# Patient Record
Sex: Female | Born: 1940 | Race: Black or African American | Hispanic: No | Marital: Married | Smoking: Former smoker
Health system: Southern US, Community
[De-identification: ages and names within clinical notes are randomized; demographics above are authoritative.]

## PROBLEM LIST (undated history)

## (undated) DIAGNOSIS — M1990 Unspecified osteoarthritis, unspecified site: Secondary | ICD-10-CM

## (undated) DIAGNOSIS — D649 Anemia, unspecified: Secondary | ICD-10-CM

## (undated) DIAGNOSIS — H409 Unspecified glaucoma: Secondary | ICD-10-CM

## (undated) DIAGNOSIS — Z780 Asymptomatic menopausal state: Secondary | ICD-10-CM

## (undated) DIAGNOSIS — K219 Gastro-esophageal reflux disease without esophagitis: Secondary | ICD-10-CM

## (undated) DIAGNOSIS — I10 Essential (primary) hypertension: Secondary | ICD-10-CM

## (undated) HISTORY — DX: Unspecified osteoarthritis, unspecified site: M19.90

## (undated) HISTORY — DX: Asymptomatic menopausal state: Z78.0

## (undated) HISTORY — DX: Essential (primary) hypertension: I10

## (undated) HISTORY — DX: Anemia, unspecified: D64.9

## (undated) HISTORY — PX: OTHER SURGICAL HISTORY: SHX169

## (undated) HISTORY — DX: Unspecified glaucoma: H40.9

---

## 1976-02-26 HISTORY — PX: TUBAL LIGATION: SHX77

## 1997-12-30 ENCOUNTER — Other Ambulatory Visit: Admission: RE | Admit: 1997-12-30 | Discharge: 1997-12-30 | Payer: Self-pay | Admitting: Obstetrics and Gynecology

## 1998-07-14 ENCOUNTER — Ambulatory Visit (HOSPITAL_COMMUNITY): Admission: RE | Admit: 1998-07-14 | Discharge: 1998-07-14 | Payer: Self-pay | Admitting: *Deleted

## 1999-02-12 ENCOUNTER — Other Ambulatory Visit: Admission: RE | Admit: 1999-02-12 | Discharge: 1999-02-12 | Payer: Self-pay | Admitting: Obstetrics and Gynecology

## 1999-03-23 ENCOUNTER — Encounter: Admission: RE | Admit: 1999-03-23 | Discharge: 1999-03-23 | Payer: Self-pay | Admitting: Obstetrics and Gynecology

## 1999-03-23 ENCOUNTER — Encounter: Payer: Self-pay | Admitting: Obstetrics and Gynecology

## 2000-05-13 ENCOUNTER — Encounter: Payer: Self-pay | Admitting: Obstetrics and Gynecology

## 2000-05-13 ENCOUNTER — Encounter: Admission: RE | Admit: 2000-05-13 | Discharge: 2000-05-13 | Payer: Self-pay | Admitting: Obstetrics and Gynecology

## 2003-08-23 ENCOUNTER — Encounter: Admission: RE | Admit: 2003-08-23 | Discharge: 2003-08-23 | Payer: Self-pay | Admitting: Family Medicine

## 2003-08-26 ENCOUNTER — Encounter (INDEPENDENT_AMBULATORY_CARE_PROVIDER_SITE_OTHER): Payer: Self-pay | Admitting: *Deleted

## 2003-08-26 LAB — CONVERTED CEMR LAB

## 2003-08-31 ENCOUNTER — Encounter: Admission: RE | Admit: 2003-08-31 | Discharge: 2003-08-31 | Payer: Self-pay | Admitting: Sports Medicine

## 2003-09-02 ENCOUNTER — Encounter: Admission: RE | Admit: 2003-09-02 | Discharge: 2003-09-02 | Payer: Self-pay | Admitting: Sports Medicine

## 2003-09-05 ENCOUNTER — Encounter: Admission: RE | Admit: 2003-09-05 | Discharge: 2003-09-05 | Payer: Self-pay | Admitting: Family Medicine

## 2003-09-16 ENCOUNTER — Encounter: Admission: RE | Admit: 2003-09-16 | Discharge: 2003-09-16 | Payer: Self-pay | Admitting: Family Medicine

## 2006-04-24 DIAGNOSIS — N951 Menopausal and female climacteric states: Secondary | ICD-10-CM | POA: Insufficient documentation

## 2006-04-25 ENCOUNTER — Encounter (INDEPENDENT_AMBULATORY_CARE_PROVIDER_SITE_OTHER): Payer: Self-pay | Admitting: *Deleted

## 2006-05-19 ENCOUNTER — Ambulatory Visit (HOSPITAL_COMMUNITY): Admission: RE | Admit: 2006-05-19 | Discharge: 2006-05-19 | Payer: Self-pay | Admitting: Family Medicine

## 2006-06-03 ENCOUNTER — Ambulatory Visit: Payer: Self-pay | Admitting: Family Medicine

## 2006-06-03 ENCOUNTER — Ambulatory Visit (HOSPITAL_COMMUNITY): Admission: RE | Admit: 2006-06-03 | Discharge: 2006-06-03 | Payer: Self-pay | Admitting: Family Medicine

## 2006-06-03 ENCOUNTER — Encounter: Payer: Self-pay | Admitting: Family Medicine

## 2006-06-03 DIAGNOSIS — F17200 Nicotine dependence, unspecified, uncomplicated: Secondary | ICD-10-CM

## 2006-06-03 DIAGNOSIS — Z87891 Personal history of nicotine dependence: Secondary | ICD-10-CM | POA: Insufficient documentation

## 2006-06-03 LAB — CONVERTED CEMR LAB
BUN: 11 mg/dL (ref 6–23)
CO2: 23 meq/L (ref 19–32)
Calcium: 9.2 mg/dL (ref 8.4–10.5)
Chloride: 107 meq/L (ref 96–112)
Glucose, Bld: 97 mg/dL (ref 70–99)
HCT: 38.1 %
HDL: 50 mg/dL (ref >39)
Hemoglobin: 12.3 g/dL
MCV: 76.5 fL
Sodium: 141 meq/L (ref 135–145)
TSH: 1.519 microintl units/mL (ref 0.350–5.50)
VLDL: 25 mg/dL (ref 0–40)
WBC: 7.7 10*3/uL

## 2006-06-04 ENCOUNTER — Encounter: Payer: Self-pay | Admitting: Family Medicine

## 2006-06-06 ENCOUNTER — Encounter: Payer: Self-pay | Admitting: Family Medicine

## 2006-09-30 ENCOUNTER — Ambulatory Visit: Payer: Self-pay | Admitting: Family Medicine

## 2006-10-02 ENCOUNTER — Ambulatory Visit: Payer: Self-pay | Admitting: Family Medicine

## 2006-11-14 ENCOUNTER — Encounter: Payer: Self-pay | Admitting: Family Medicine

## 2007-06-22 ENCOUNTER — Ambulatory Visit (HOSPITAL_COMMUNITY): Admission: RE | Admit: 2007-06-22 | Discharge: 2007-06-22 | Payer: Self-pay | Admitting: Family Medicine

## 2007-08-12 ENCOUNTER — Ambulatory Visit (HOSPITAL_BASED_OUTPATIENT_CLINIC_OR_DEPARTMENT_OTHER): Admission: RE | Admit: 2007-08-12 | Discharge: 2007-08-12 | Payer: Self-pay | Admitting: Orthopedic Surgery

## 2008-05-15 ENCOUNTER — Emergency Department (HOSPITAL_COMMUNITY): Admission: EM | Admit: 2008-05-15 | Discharge: 2008-05-15 | Payer: Self-pay | Admitting: Emergency Medicine

## 2008-06-23 ENCOUNTER — Ambulatory Visit (HOSPITAL_COMMUNITY): Admission: RE | Admit: 2008-06-23 | Discharge: 2008-06-23 | Payer: Self-pay | Admitting: Family Medicine

## 2008-12-14 ENCOUNTER — Encounter: Payer: Self-pay | Admitting: Family Medicine

## 2008-12-14 ENCOUNTER — Ambulatory Visit: Payer: Self-pay | Admitting: Family Medicine

## 2008-12-14 DIAGNOSIS — E663 Overweight: Secondary | ICD-10-CM

## 2008-12-14 DIAGNOSIS — E669 Obesity, unspecified: Secondary | ICD-10-CM | POA: Insufficient documentation

## 2008-12-14 LAB — CONVERTED CEMR LAB
HDL: 54 mg/dL (ref >39)
LDL Cholesterol: 89 mg/dL (ref 0–99)
Total CHOL/HDL Ratio: 2.9
VLDL: 16 mg/dL (ref 0–40)

## 2008-12-17 ENCOUNTER — Encounter: Payer: Self-pay | Admitting: Family Medicine

## 2008-12-19 ENCOUNTER — Encounter: Payer: Self-pay | Admitting: Family Medicine

## 2008-12-20 ENCOUNTER — Encounter: Admission: RE | Admit: 2008-12-20 | Discharge: 2008-12-20 | Payer: Self-pay | Admitting: Family Medicine

## 2008-12-20 ENCOUNTER — Encounter: Payer: Self-pay | Admitting: Family Medicine

## 2009-01-17 ENCOUNTER — Encounter: Payer: Self-pay | Admitting: Family Medicine

## 2009-01-26 ENCOUNTER — Encounter: Payer: Self-pay | Admitting: Family Medicine

## 2009-02-25 HISTORY — PX: JOINT REPLACEMENT: SHX530

## 2009-03-01 ENCOUNTER — Encounter: Payer: Self-pay | Admitting: Family Medicine

## 2009-03-09 ENCOUNTER — Encounter: Payer: Self-pay | Admitting: Family Medicine

## 2009-03-09 DIAGNOSIS — K648 Other hemorrhoids: Secondary | ICD-10-CM | POA: Insufficient documentation

## 2009-03-09 DIAGNOSIS — K5730 Diverticulosis of large intestine without perforation or abscess without bleeding: Secondary | ICD-10-CM

## 2009-03-09 HISTORY — DX: Diverticulosis of large intestine without perforation or abscess without bleeding: K57.30

## 2009-03-10 ENCOUNTER — Encounter: Payer: Self-pay | Admitting: Family Medicine

## 2009-03-13 ENCOUNTER — Encounter: Payer: Self-pay | Admitting: Family Medicine

## 2009-03-15 ENCOUNTER — Encounter: Payer: Self-pay | Admitting: Family Medicine

## 2009-03-20 ENCOUNTER — Ambulatory Visit (HOSPITAL_COMMUNITY): Admission: RE | Admit: 2009-03-20 | Discharge: 2009-03-20 | Payer: Self-pay | Admitting: Family Medicine

## 2009-03-20 ENCOUNTER — Ambulatory Visit: Payer: Self-pay | Admitting: Family Medicine

## 2009-03-23 ENCOUNTER — Encounter: Payer: Self-pay | Admitting: Family Medicine

## 2009-04-03 ENCOUNTER — Telehealth: Payer: Self-pay | Admitting: Family Medicine

## 2009-04-05 ENCOUNTER — Ambulatory Visit: Payer: Self-pay | Admitting: Family Medicine

## 2009-04-05 ENCOUNTER — Encounter: Payer: Self-pay | Admitting: Family Medicine

## 2009-04-05 LAB — CONVERTED CEMR LAB
BUN: 10 mg/dL (ref 6–23)
CO2: 26 meq/L (ref 19–32)
Calcium: 8.9 mg/dL (ref 8.4–10.5)
Chloride: 105 meq/L (ref 96–112)
Hemoglobin: 10.8 g/dL — ABNORMAL LOW (ref 12.0–15.0)
MCHC: 30.9 g/dL (ref 30.0–36.0)
Platelets: 295 10*3/uL (ref 150–400)
RBC: 4.59 M/uL (ref 3.87–5.11)
RDW: 17.1 % — ABNORMAL HIGH (ref 11.5–15.5)
Sodium: 141 meq/L (ref 135–145)
WBC: 9.2 10*3/uL (ref 4.0–10.5)

## 2009-04-07 ENCOUNTER — Encounter: Payer: Self-pay | Admitting: Family Medicine

## 2009-04-07 DIAGNOSIS — I10 Essential (primary) hypertension: Secondary | ICD-10-CM | POA: Insufficient documentation

## 2009-04-12 ENCOUNTER — Encounter: Payer: Self-pay | Admitting: Family Medicine

## 2009-04-13 ENCOUNTER — Encounter: Payer: Self-pay | Admitting: Family Medicine

## 2009-04-17 ENCOUNTER — Encounter: Payer: Self-pay | Admitting: Family Medicine

## 2009-04-17 ENCOUNTER — Ambulatory Visit: Payer: Self-pay | Admitting: Family Medicine

## 2009-04-18 ENCOUNTER — Encounter: Payer: Self-pay | Admitting: Family Medicine

## 2009-04-18 DIAGNOSIS — D509 Iron deficiency anemia, unspecified: Secondary | ICD-10-CM | POA: Insufficient documentation

## 2009-04-18 LAB — CONVERTED CEMR LAB
Iron: 22 ug/dL — ABNORMAL LOW (ref 42–145)
Saturation Ratios: 7 % — ABNORMAL LOW (ref 20–55)

## 2009-04-19 ENCOUNTER — Inpatient Hospital Stay (HOSPITAL_COMMUNITY): Admission: RE | Admit: 2009-04-19 | Discharge: 2009-04-22 | Payer: Self-pay | Admitting: Orthopedic Surgery

## 2009-06-23 ENCOUNTER — Encounter: Payer: Self-pay | Admitting: Family Medicine

## 2009-07-05 ENCOUNTER — Ambulatory Visit (HOSPITAL_COMMUNITY): Admission: RE | Admit: 2009-07-05 | Discharge: 2009-07-05 | Payer: Self-pay | Admitting: Family Medicine

## 2009-07-07 ENCOUNTER — Encounter: Payer: Self-pay | Admitting: Family Medicine

## 2009-07-10 ENCOUNTER — Encounter: Admission: RE | Admit: 2009-07-10 | Discharge: 2009-07-10 | Payer: Self-pay | Admitting: Family Medicine

## 2010-01-03 ENCOUNTER — Encounter: Payer: Self-pay | Admitting: Family Medicine

## 2010-02-15 ENCOUNTER — Ambulatory Visit (HOSPITAL_COMMUNITY)
Admission: RE | Admit: 2010-02-15 | Discharge: 2010-02-15 | Payer: Self-pay | Source: Home / Self Care | Admitting: Family Medicine

## 2010-02-15 ENCOUNTER — Ambulatory Visit: Payer: Self-pay | Admitting: Family Medicine

## 2010-02-15 ENCOUNTER — Encounter: Payer: Self-pay | Admitting: Family Medicine

## 2010-02-15 DIAGNOSIS — I498 Other specified cardiac arrhythmias: Secondary | ICD-10-CM | POA: Insufficient documentation

## 2010-02-15 DIAGNOSIS — R011 Cardiac murmur, unspecified: Secondary | ICD-10-CM | POA: Insufficient documentation

## 2010-02-15 LAB — CONVERTED CEMR LAB
BUN: 16 mg/dL (ref 6–23)
CO2: 26 meq/L (ref 19–32)
Calcium: 9.6 mg/dL (ref 8.4–10.5)
Glucose, Bld: 100 mg/dL — ABNORMAL HIGH (ref 70–99)
Sodium: 141 meq/L (ref 135–145)

## 2010-02-23 ENCOUNTER — Ambulatory Visit: Admission: RE | Admit: 2010-02-23 | Discharge: 2010-02-23 | Payer: Self-pay | Source: Home / Self Care

## 2010-02-23 ENCOUNTER — Encounter: Payer: Self-pay | Admitting: Family Medicine

## 2010-02-23 ENCOUNTER — Ambulatory Visit (HOSPITAL_COMMUNITY)
Admission: RE | Admit: 2010-02-23 | Discharge: 2010-02-23 | Payer: Self-pay | Source: Home / Self Care | Attending: Family Medicine | Admitting: Family Medicine

## 2010-03-02 ENCOUNTER — Telehealth: Payer: Self-pay | Admitting: Family Medicine

## 2010-03-09 ENCOUNTER — Telehealth: Payer: Self-pay | Admitting: Family Medicine

## 2010-03-18 ENCOUNTER — Encounter: Payer: Self-pay | Admitting: Family Medicine

## 2010-03-25 LAB — CONVERTED CEMR LAB
AST: 13 units/L
Alkaline Phosphatase: 71 units/L
BUN: 10 mg/dL
Bilirubin, Direct: 0.1 mg/dL
Chloride: 105 meq/L
Creatinine, Ser: 0.82 mg/dL
Glucose, Bld: 88 mg/dL
MCV: 78 fL
RBC: 4.95 M/uL
RDW: 17.4 %
Sodium: 140 meq/L
Total Bilirubin: 0.2 mg/dL

## 2010-03-29 NOTE — Assessment & Plan Note (Signed)
Summary: pre op,df   Vital Signs:  Patient profile:   70 year old female Weight:      190.6 pounds Temp:     97.8 degrees F oral Pulse rate:   86 / minute Pulse rhythm:   regular BP sitting:   160 / 87  (left arm) Cuff size:   regular  Vitals Entered By: Loralee Pacas CMA (April 05, 2009 1:51 PM)  Serial Vital Signs/Assessments:  Time      Position  BP       Pulse  Resp  Temp     By                     150/90                         Tenicia Gural MD   History of Present Illness: 70 y/o F is here for pre-op clearance.  She is scheduled to have L knee replacement with Dr Luiz Blare on 04/19/09.  Jodi Geralds, MD.  [facility].  Fax [phone].  Phone [phone].    Clotting problems:  Pt states that when she was pregnant with her 3rd child she had blood clot.  She cannot recall if she was on any blood thinners during or after pregnancy.  I told her to inform Dr Luiz Blare of this prior to surgery but that most likely she will get blood thinner prior to and after surgery.  BP: pt checks at [facility] and it is usally in the 150s.  Today BP is elevated also.  Pt does not have Dx of hypertension.    Pain in left knee.  Pt stopped taking Ibuprofen one week ago.  She is just dealing with the pain now as she waits for surgery.    Obesity:  worsened.  gained weight.  not exercising.  states that she and husband will join [employer] because they qualify for free membership.    Habits & Providers  Alcohol-Tobacco-Diet     Alcohol drinks/day: 0     Tobacco Status: quit > 6 months  Exercise-Depression-Behavior     Does Patient Exercise: no      Have you felt down or hopeless? no     STD Risk: never  Current Medications (verified): 1)  Ibuprofen 200 Mg Tabs (Ibuprofen) .... 2 Tab By Mouth Bid 2)  Ferrous Sulfate 325 (65 Fe) Mg Tabs (Ferrous Sulfate) .Marland Kitchen.. 1 Tab By Mouth Daily 3)  Metoprolol Tartrate 50 Mg Tabs (Metoprolol Tartrate) .... 1/2 Tab By Mouth Two Times A Day For Blood  Presure  Allergies (verified): No Known Drug Allergies  Past History:  Past Surgical History: Last updated: 03/09/2009 Left knee surgery 07/2007: fracture bone in knee Colonoscopy 02/2009 by Dr Loreta Ave  Family History: Last updated: 04/05/2009 Father - HTN, CVA age 41. died from aneurysm Mother - healthy, 55 yr old Brother- died from prostate cancer No colon cancer Neice with breast cancer (age 40) No gyn cancer  Social History: Last updated: 04/05/2009 Married, lives with husband and grandson (pt adopted him when he was 11 days old, age 37 now).   Works at [employer]   5 children (youngest adopted- he doesn't know) smokes 1-2 pack per day until 2009 no EtOH Drug use-no  Risk Factors: Alcohol Use: 0 (04/05/2009) Exercise: no  (04/05/2009)  Risk Factors: Smoking Status: quit > 6 months (04/05/2009)  Past Medical History: Obesity Internal Hemorrhoids (Dr Loreta Ave 02/2009) Sigmoid  Diverticulosis (Dr Loreta Ave 02/2009) MMSE 30/30 on 04/05/09  Family History: Reviewed history from 12/14/2008 and no changes required. Father - HTN, CVA age 31. died from aneurysm Mother - healthy, 2 yr old Brother- died from prostate cancer No colon cancer Neice with breast cancer (age 26) No gyn cancer  Social History: Reviewed history from 12/14/2008 and no changes required. Married, lives with husband and grandson (pt adopted him when he was 63 days old, age 24 now).   Works at [employer]   5 children (youngest adopted- he doesn't know) smokes 1-2 pack per day until 2009 no EtOH Drug use-no Does Patient Exercise:  no   Review of Systems  The patient denies anorexia, fever, weight loss, chest pain, syncope, peripheral edema, prolonged cough, headaches, abdominal pain, hematuria, incontinence, and abnormal bleeding.    Physical Exam  General:  Well-developed,well-nourished,in no acute distress; alert,appropriate and cooperative throughout examination. vitals  reviewed. Head:  normocephalic, atraumatic, and no abnormalities observed.   Neck:  No deformities, masses, or tenderness noted. Lungs:  Normal respiratory effort, chest expands symmetrically. Lungs are clear to auscultation, no crackles or wheezes. Heart:  Holosystolic murmur louder towards axilla.  Otherwise normal rate.  no JVD.   Abdomen:  Bowel sounds positive,abdomen soft and non-tender without masses, organomegaly or hernias noted. Msk:  No deformity  noted of thoracic or lumbar spine.   Pulses:  R radial normal, R dorsalis pedis normal, L radial normal, and L dorsalis pedis normal.   Extremities:  No clubbing, cyanosis, edema, or deformity noted with normal full range of motion of all joints.   Neurologic:  No cranial nerve deficits noted. Station and gait are normal. Plantar reflexes are down-going bilaterally. DTRs are symmetrical throughout. Sensory, motor and coordinative functions appear intact. Skin:  Intact without suspicious lesions or rashes Cervical Nodes:  No lymphadenopathy noted Psych:  Oriented X3, memory intact for recent and remote, and normally interactive.   Additional Exam:  .    Impression & Recommendations:  Problem # 1:  PREOPERATIVE EXAMINATION (ICD-V72.84) Assessment New Pt without history of MI or CAD.  She is currently working part time in daycare.  MMSE today was 30/30.  She states today history of clot during pregnancy with 3rd child, but does not have further details.  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  Given elevated BP and age, will start low dose BB or mycardial protection  Pt is at intermediate risk for orthopedic surgery.  Will get CBC and Bmet today.   Orders: Basic Met-[facility] ([id number]) CBC-[facility] (14782) [facility]- Est  Level 4 (95621)  Problem # 2:  HYPERTENSION NEC (ICD-997.91) Assessment: New BP elevated today, even with recheck.  Pt has had BPs in 150s when she checked at [facility].  Will start low-dose  BB as it is cardioprotected.  Will start Lopressor 25mg  by mouth two times a day.  Pt to rtc in 4-6 wks for recheck.   Orders: CBC-[facility] (30865) [facility]- Est  Level 4 (78469)  Problem # 3:  OBESITY, CLASS I (ICD-278.02) Assessment: Deteriorated Pt has gained 4 lbs since last visit.  W e discussed at length the need to have healthy diet (decreased fried foods, animal products) and exercise.  Pt will have knee surgery, which will leave her less mobile so she will need to be more conscious of food decisions.  Pt will need to resume exercise regime as soon as she is cleared by PT  and orthop. Orders: Basic Met-[facility] ([id number]) CBC-[facility] (09811) [facility]- Est  Level 4 (91478)  Complete Medication List: 1)  Ibuprofen 200 Mg Tabs (Ibuprofen) .... 2 tab by mouth bid 2)  Ferrous Sulfate 325 (65 Fe) Mg Tabs (Ferrous sulfate) .Marland Kitchen.. 1 tab by mouth daily 3)  Metoprolol Tartrate 50 Mg Tabs (Metoprolol tartrate) .... 1/2 tab by mouth two times a day for blood presure  Patient Instructions: 1)  Please schedule a follow-up appointment in 4-6 weeks for blood pressue.  2)  New medicine: Metoprolol 50mg ; 1/2 tab by mouth two times a day for blood pressure.  Prescriptions: METOPROLOL TARTRATE 50 MG TABS (METOPROLOL TARTRATE) 1/2 tab by mouth two times a day for blood presure  #34 x 5   Entered and Authorized by:   Angeline Slim MD   Signed by:   Angeline Slim MD on 04/05/2009   Method used:   Electronically to        [id number]       [address]       [place], [place]  [address]       Ph: [phone] or [phone]       Fax: [phone]   RxID:   [id number]   [id number]: MINI-MENTAL STATUS EXAM    MAX              SCORE   SCORE      ORIENTATION      5     :      What is the (yr)(season)(date)(day)(month) ?        5     :      Where are we (state)(county)(town/city)                       (hospital/office)(floor)                      REGISTRATION      3     :              Name 3 common objects (e.g. "apple","table",                       "penny") Take 1 second to say each. then ask                                     the pt to repeat all 3 after you have said them.        Give 1 point for each correct answer.                       Then repeat them until pt learns all 3. Count the trials & record.         Trials:                      ATTENTION & CALCULATION      5     :             Spell "world" backwards. Score = number of letters in correct order.            (D__L__R__O__W__)                      RECALL  3     :           Ask for the 3 objects repeated above(1 pt each)                       [Note: Recall cannot be tested if all 3 objects                       were note remembered during registration]                      LANGUAGE      2     :          Name a "pencil", and "watch". (2 pts)      1     :          Repeat the following."No ifs, ands, or buts."      3     :           Follow a 3-stage command:                       "Take a piece of paper in your right hand, fold it in half, and put it on the floor."                        Read & obey the following:      1     :           CLOSE YOUR EYES.             (1 POINT)      1     :          WRITE A SENTENCE.            (1 POINT)      1     :          COPY THE FOLLOWING DESIGN.   (1 POINT)  TOTAL SCORE:     OUT OF MAXIMUM OF 30  Total score: 30/30  Appended Document: pre op,df

## 2010-03-29 NOTE — Letter (Signed)
Summary: Generic Letter  [facility]  [address]   [place], [place] [address]   Phone: [phone]  Fax: [phone]    04/18/2009  [name] [name] [address] [place], [place]  [address]  Dear Ms. Raul Del,  I hope that this letter finds you well.  The recent blood test showed that you have iron deficiency anemia.  We can treat this by giving you daily iron supplements.  I have sent a prescription for Iron 325mg  to your family.  You should take this twice a day.  Iron supplementation may cause constipation.  You can decrease constipation by taking a stool softener daily while you are taking iron.  I will also send a prescription for a stool softener to your pharmacy.    If you have any questions, please contact me at the [facility].   Sincerely,   Anton Cheramie MD  Appended Document: Generic Letter mailed.

## 2010-03-29 NOTE — Miscellaneous (Signed)
  Clinical Lists Changes  Problems: Removed problem of HYPERTENSION NEC (ICD-997.91) Added new problem of ESSENTIAL HYPERTENSION (ICD-401.9) Observations: Added new observation of PRIMARY MD: Anita Laguna MD (04/07/2009 13:39)

## 2010-03-29 NOTE — Consult Note (Signed)
Summary: [facility]  [facility]   Imported By: Clydell Hakim 03/16/2009 16:37:42  _____________________________________________________________________  External Attachment:    Type:   Image     Comment:   External Document

## 2010-03-29 NOTE — Progress Notes (Signed)
Summary: Echo result  Phone Note Outgoing Call   Call placed by: Annaliz Aven MD,  March 02, 2010 12:32 PM Call placed to: Patient Summary of Call: Call pt to discuss Echo result.  Left message that she can call me back.   Echo showed: Normal LV size and systolic function, EF 55-60%. Moderat diastolic dysfunction. Thickened mitral leaflets with partial flail of the P2 segment and eccentric mitral regurgitation that may be severe.  Cardiologist suggest TEE to further evaluate.   Initial call taken by: Elouise Divelbiss MD,  March 02, 2010 12:32 PM

## 2010-03-29 NOTE — Progress Notes (Signed)
Summary: EKG result  Phone Note Outgoing Call   Call placed by: Dorothymae Maciver MD,  April 03, 2009 12:29 PM Call placed to: Patient Summary of Call: Called pt regarding EKG.  Told her that EKG same as previous and that I needed to see her for surgery clearance.  Pt voiced understanding. Initial call taken by: Lewi Drost MD,  April 03, 2009 12:31 PM

## 2010-03-29 NOTE — Assessment & Plan Note (Signed)
Summary: EKG for surgery clearance,tcb  Nurse Visit patient in office for EKG for surgery clearance. EKG done and results given to Dr. Swaziland. Patient denies any chest pain. Dr. Swaziland advises that patient schedule appointment for follow up with Dr. Janalyn Harder to discuss surgical  clearance. appointment scheduled for 04/03/2009. surgery is scheduled for 04/19/2009. Theresia Lo RN  March 20, 2009 11:07 AM   Allergies: No Known Drug Allergies  Orders Added: 1)  EKG- [facility] [EKG] 2)  Est Level 1- [facility] [[address]]

## 2010-03-29 NOTE — Miscellaneous (Signed)
Summary: Changing prob list   Clinical Lists Changes  Problems: Removed problem of PREOPERATIVE EXAMINATION (ICD-V72.84) Removed problem of SCREENING FOR IRON DEFICIENCY ANEMIA (ICD-V78.0) Removed problem of SCREENING FOR PULMONARY TB (ICD-V74.1) Removed problem of SCREENING FOR MALIGNANT NEOPLASM OF THE CERVIX (ICD-V76.2) Removed problem of HOT FLASHES (ICD-627.2)

## 2010-03-29 NOTE — Progress Notes (Signed)
Summary: phn msg: Echo result  Phone Note Call from Patient Call back at Home Phone [phone]   Caller: Patient Summary of Call: pt returned call  Initial call taken by: De Nurse,  March 09, 2010 3:44 PM  Follow-up for Phone Call        Called pt back.  Discussed Echo result with pt.  Discussed that echo showed flailing of Mitral valve that should be followed up with TEE.  Pt states that she will call her insurance first to see if they would cover the TEE because she would not have money to pay for it otherwise.  Asked pt to call me back with her decision, she agreed.  Follow-up by: Angeline Slim MD,  March 12, 2010 8:27 AM

## 2010-03-29 NOTE — Consult Note (Signed)
Summary: [facility]  [facility]   Imported By: Clydell Hakim 04/19/2009 15:38:07  _____________________________________________________________________  External Attachment:    Type:   Image     Comment:   External Document

## 2010-03-29 NOTE — Miscellaneous (Signed)
  Clinical Lists Changes  Problems: Removed problem of SPECIAL SCREENING FOR MALIGNANT NEOPLASMS COLON (ICD-V76.51) Removed problem of PHYSICAL EXAMINATION (ICD-V70.0) Added new problem of DIVERTICULOSIS, SIGMOID COLON (ICD-562.10) Added new problem of HEMORRHOIDS, INTERNAL (ICD-455.0) Observations: Added new observation of PAST SURG HX: Left knee surgery 07/2007: fracture bone in knee Colonoscopy 02/2009 by Dr Loreta Ave (03/09/2009 6:36) Added new observation of PAST MED HX: Obesity Internal Hemorrhoids (Dr Loreta Ave 02/2009) Sigmoid Diverticulosis (Dr Loreta Ave 02/2009)  (03/09/2009 6:36) Added new observation of PRIMARY MD: Jasraj Lappe MD (03/09/2009 6:36)       Past History:  Past Medical History: Obesity Internal Hemorrhoids (Dr Loreta Ave 02/2009) Sigmoid Diverticulosis (Dr Loreta Ave 02/2009)  Past Surgical History: Left knee surgery 07/2007: fracture bone in knee Colonoscopy 02/2009 by Dr Loreta Ave

## 2010-03-29 NOTE — Miscellaneous (Signed)
Summary: clearance note for surgery  Clinical Lists Changes Dr. Luiz Blare at [facility] orth[facility] needs a note clearing her for knee replacement surgery 04/19/09. form in md chart box.Golden Circle RN  March 15, 2009 9:21 AM     Please have pt come in for EKG. Jiovanna Frei MD  March 15, 2009 9:10 PM

## 2010-03-29 NOTE — Letter (Signed)
Summary: Right Breast Mass  [facility]  [address]   [place], [place] [address]   Phone: [phone]  Fax: [phone]    07/07/2009  [name] [name] [address] [place], [place]  [address]  Dear Ms. Raul Del,  I've tried to call you by phone but was unable to reach you.  Your most recently Mammogram showed an area in the Right Breast that is concerning.  This will need further evaluation.  If you have not done so, please call the [facility] Center to schedule an appointment.  Please let me know if you have any questions.       Sincerely,   Luba Matzen MD  Appended Document: Right Breast Mass mailed certified.

## 2010-03-29 NOTE — Letter (Signed)
Summary: Results Follow-up Letter  [facility]  [address]   [place], [place] [address]   Phone: [phone]  Fax:     06/06/2006  [address] [place], [place]  [address]  Dear Ms. ZANETTI,   The following are the results of your recent test(s):  Test     Result     Pap Smear    Normal___X___  Not Normal_____       Comments: _________________________________________________________ Cholesterol LDL(Bad cholesterol):          Your goal is less than:         HDL (Good cholesterol):        Your goal is more than: _________________________________________________________ Other Tests: Blood counts - normal  _________________________________________________________  Please call [phone] for an appointment Or _________________________________________________________ _________________________________________________________ _________________________________________________________  Sincerely,  Norton Blizzard MD [facility]

## 2010-03-29 NOTE — Miscellaneous (Signed)
Summary: Labs for Anemia work up  Clinical Lists Changes  Medications: Removed medication of FERROUS SULFATE 325 (65 FE) MG TABS (FERROUS SULFATE) 1 tab by mouth daily Orders: Added new Test order of B12-[facility] [phone]) - Signed Added new Test order of Ferritin-[facility] [phone]) - Signed Added new Test order of Iron -[facility] [phone]) - Signed Added new Test order of Iron Binding Cap (TIBC)-[facility] ([id number]) - Signed Added new Test order of Retic-[facility] ([id number]) - Signed Added new Test order of Miscellaneous Lab Charge-[facility] ([address]272) - Signed    Called pt at home number.  Told her that I would like to work up her Anemia.  Hb 10.8 recently and 11.3 and 12.3 in the past.  C-scope this year was wnl.  Pt has EGD scheduled.  Pt states that she will come by tomorrow.  Cherae Marton MD  April 12, 2009 5:10 PM

## 2010-03-29 NOTE — Letter (Signed)
Summary: PreOp Clearance  [facility]  [address]   [place], [place] [address]   Phone: [phone]  Fax: [phone]    04/07/2009  COURTANY MCMURPHY [address] [place], [place]  [address]  Dear Dr. Luiz Blare:  This letter is on behalf of Mrs. Gretel Acre for medical clearance for Left Knee Surgery.  Mrs. Blassingame is at low to moderate risk for this procedure.  She does not have a history of CAD or MI in the past 6 months.  MMSE with score of 30/30.  She did not have any changes on EKG, which showed NSR and an old septal infarct seen previous on EKG in 2008.  I am unsure of her clotting history as she mentioned during this office visit that she had a "blood clot" during pregnancy with her 3rd child.  She could not offer further details.  I have started Mrs. Thornley on a low dose beta-blocker, Lopressor 25mg  by mouth two times a day for hypertension.  I have enclosed a copy of Mrs. Alton's EKG and most recent CBC and Bmet for your records.  Please let me know if I can be of further assistance.      Sincerely,   Lyanna Blystone MD

## 2010-03-29 NOTE — Consult Note (Signed)
Summary: [facility]: negative, 10 yrs  [facility]   Imported By: De Nurse 03/07/2009 14:24:17  _____________________________________________________________________  External Attachment:    Type:   Image     Comment:   External Document  Appended Document: [facility]: c-scope    Clinical Lists Changes  Orders: Added new Test order of Mammogram (Screening) (Mammo) - Signed Observations: Added new observation of MAMMRECACT: Ordered (03/09/2009 6:31) Added new observation of NURSEINSTR: Schedule screening mammogram (see order)  (03/09/2009 6:31) Added new observation of HEMOCCRECACT: Not indicated (03/09/2009 6:31) Added new observation of COLONNXTDUE: 03/02/2019 (03/09/2009 6:31) Added new observation of DM PROGRESS: N/A (03/09/2009 6:31) Added new observation of DM FSREVIEW: N/A (03/09/2009 6:31) Added new observation of HTN PROGRESS: N/A (03/09/2009 6:31) Added new observation of HTN FSREVIEW: N/A (03/09/2009 6:31) Added new observation of LIPID PROGRS: N/A (03/09/2009 6:31) Added new observation of LIPID FSREVW: N/A (03/09/2009 6:31) Added new observation of COLONOSCOPY: Findings: Very redunctat colon, scattered sigmoid diverticulosis and prominent internal hemorrhoids, otherwise normal c-scope. Repeat C-scope in 10 yrs. EGD in 4-6 wks. (03/01/2009 6:34) Added new observation of COLONOSCOPY: Location:  [facility], Dr Loreta Ave   (03/01/2009 6:32)       Colonoscopy  Procedure date:  03/01/2009  Findings:      Location:  [facility], Dr Loreta Ave    Colonoscopy  Procedure date:  03/01/2009  Findings:      Findings: Very redunctat colon, scattered sigmoid diverticulosis and prominent internal hemorrhoids, otherwise normal c-scope. Repeat C-scope in 10 yrs. EGD in 4-6 wks.     Prevention & Chronic Care Immunizations   Influenza vaccine: Fluvax MCR  (12/14/2008)    Tetanus booster: 06/03/2006:  Td    Pneumococcal vaccine: Not documented    H. zoster vaccine: Not documented  Colorectal Screening   Hemoccult: Not documented   Hemoccult action/deferral: Not indicated  (03/09/2009)    Colonoscopy: Findings: Very redunctat colon, scattered sigmoid diverticulosis and prominent internal hemorrhoids, otherwise normal c-scope. Repeat C-scope in 10 yrs. EGD in 4-6 wks.  (03/01/2009)   Colonoscopy action/deferral: GI Referral  (12/14/2008)   Colonoscopy due: 03/02/2019  Other Screening   Pap smear: NEGATIVE FOR INTRAEPITHELIAL LESIONS OR MALIGNANCY.  (12/14/2008)    Mammogram: wnl  (05/15/2006)   Mammogram action/deferral: Ordered  (03/09/2009)    DXA bone density scan:  Lumbar Spine:  T Score > -1.0 Spine.  Hip Total: T Score > -1.0 Hip.    (12/20/2008)   DXA bone density action/deferral: Ordered  (12/14/2008)   Smoking status: quit > 6 months  (12/14/2008)  Lipids   Total Cholesterol: 159  (12/14/2008)   LDL: 89  (12/14/2008)   LDL Direct: Not documented   HDL: 54  (12/14/2008)   Triglycerides: 81  (12/14/2008)   Nursing Instructions: Schedule screening mammogram (see order)

## 2010-03-29 NOTE — Miscellaneous (Signed)
Summary: Knee surgery L  Clinical Lists Changes  Observations: Added new observation of PAST SURG HX: Left total knee arthroplasty, computer-assisted, Jodi Geralds MD, April 19, 2009. Lateral retinacular release, left knee, Jodi Geralds MD. 03/2009 Left knee surgery 07/2007: fracture bone in knee Colonoscopy 02/2009 by Dr Loreta Ave (06/23/2009 11:09) Added new observation of PRIMARY MD: Emani Morad MD (06/23/2009 11:09)       Past History:  Past Surgical History: Left total knee arthroplasty, computer-assisted, Jodi Geralds MD, April 19, 2009. Lateral retinacular release, left knee, Jodi Geralds MD. 03/2009 Left knee surgery 07/2007: fracture bone in knee Colonoscopy 02/2009 by Dr Loreta Ave

## 2010-03-29 NOTE — Miscellaneous (Signed)
Summary: MMG: negative 06/23/08  Clinical Lists Changes  Observations: Added new observation of MAMMO DUE: 06/23/2009 (03/10/2009 14:15) Added new observation of PAPRECACT: Deferred-2 yr interval (03/10/2009 14:15) Added new observation of PAP DUE: 03/10/2010 (03/10/2009 14:15) Added new observation of DM PROGRESS: N/A (03/10/2009 14:15) Added new observation of DM FSREVIEW: N/A (03/10/2009 14:15) Added new observation of HTN PROGRESS: N/A (03/10/2009 14:15) Added new observation of HTN FSREVIEW: N/A (03/10/2009 14:15) Added new observation of LIPID PROGRS: N/A (03/10/2009 14:15) Added new observation of LIPID FSREVW: N/A (03/10/2009 14:15) Added new observation of MAMMOGRAM: Assessment: BIRADS 1.  (06/23/2008 14:18)      Mammogram  Procedure date:  06/23/2008  Findings:      Assessment: BIRADS 1.      Prevention & Chronic Care Immunizations   Influenza vaccine: Fluvax MCR  (12/14/2008)    Tetanus booster: 06/03/2006: Td    Pneumococcal vaccine: Not documented    H. zoster vaccine: Not documented  Colorectal Screening   Hemoccult: Not documented   Hemoccult action/deferral: Not indicated  (03/09/2009)    Colonoscopy: Findings: Very redunctat colon, scattered sigmoid diverticulosis and prominent internal hemorrhoids, otherwise normal c-scope. Repeat C-scope in 10 yrs. EGD in 4-6 wks.  (03/01/2009)   Colonoscopy action/deferral: GI Referral  (12/14/2008)   Colonoscopy due: 03/02/2019  Other Screening   Pap smear: NEGATIVE FOR INTRAEPITHELIAL LESIONS OR MALIGNANCY.  (12/14/2008)   Pap smear action/deferral: Deferred-2 yr interval  (03/10/2009)   Pap smear due: 03/10/2010    Mammogram: Assessment: BIRADS 1.   (06/23/2008)   Mammogram action/deferral: Ordered  (03/09/2009)   Mammogram due: 06/23/2009    DXA bone density scan:  Lumbar Spine:  T Score > -1.0 Spine.  Hip Total: T Score > -1.0 Hip.    (12/20/2008)   DXA bone density action/deferral: Ordered   (12/14/2008)   Smoking status: quit > 6 months  (12/14/2008)  Lipids   Total Cholesterol: 159  (12/14/2008)   LDL: 89  (12/14/2008)   LDL Direct: Not documented   HDL: 54  (12/14/2008)   Triglycerides: 81  (12/14/2008)

## 2010-03-29 NOTE — Assessment & Plan Note (Signed)
Summary: med refills/eo   Vital Signs:  Patient profile:   70 year old female Height:      62.75 inches Weight:      195 pounds BMI:     34.94 Temp:     97.8 degrees F oral Pulse rate:   92 / minute Pulse (ortho):   59 / minute BP sitting:   147 / 80  (left arm) BP standing:   170 / 84  Vitals Entered By: Terese Door (February 15, 2010 1:38 PM)  Serial Vital Signs/Assessments:  Time      Position  BP       Pulse  Resp  Temp     By 2:09 PM   Lying LA  160/83   60                    Cat Ta MD 2:09 PM   Sitting   163/78   57                    Cat Ta MD 2:09 PM   Standing  170/84   59                    Cat Ta MD  CC: Medication refills Is Patient Diabetic? No Pain Assessment Patient in pain? no        Primary Care Shanvi Moyd:  Cat Ta MD  CC:  Medication refills.  History of Present Illness: 70 y/o F with HTN, obesity is her for medication refills.  This is pt's 3rd office visit with me since 2010.  I last saw here in 03/2009 for pre-op clearance.  At that time pt was hypertensive and I place her on low dose BB for cardioprotection for surgery.  She was to rtc to see me after 4-6 wks for f/u of HTN, but did not.  This is her first visit to [facility] since 03/2009.    HYPERTENSION Disease Monitoring Blood pressure range: 140s-160s Medications: metoprolol 25mg  two times a day Compliance: most of the time she remembers to take her meds  Lightheadedness: no Edema: no  Chest pain: no  Dyspnea:sometimes with exertion  Palpitations: when she forgets to take her meds  Prevention Exercise: difficult to walk fast since knee surgery, but still walking everyday about 1 hr Salt restriction: trying   OBESITY: [facility] and given Phentermine 15mg  .  She was told that when her BP is less than 140 she can start taking this medicine to assist for weight loss.  She wanted to know what I thougth about this.   BMI:  34.9 vs 33.3 from last visit Weight difference since last  OV: +5 lbs Exercise: states that knee surgery prevents her from exercise, but she does walk slowly for about 1 hr daily           Diet: Not really on diet   RASH: Location: R temporal area Onset: 2 months Progression: thinks it's getting bigger Description: rash is puritic, dry, scaly.  It is limited to R temporal area.   No new products (shampoo, soap, detergent, makeup, lotion, etc).     Iron deficiency: Pt has been told she has iron deficiency and placed on FeSO4 for repletion.  She has not taken this medicaiton due to constipation.  She was also given Rx for Colace to take along with iron to decrease constipation.  She did not take colace because she was fearful that  it would stimulate her appetite.    Habits & Providers  Alcohol-Tobacco-Diet     Tobacco Status: quit > 6 months  Current Medications (verified): 1)  Metoprolol Tartrate 50 Mg Tabs (Metoprolol Tartrate) .... 1/2 Tab By Mouth Two Times A Day For Blood Presure 2)  Ferrous Sulfate 325 (65 Fe) Mg Tabs (Ferrous Sulfate) .Marland Kitchen.. 1 Tab By Mouth Two Times A Day For Iron Deficiency 3)  Colace 100 Mg Caps (Docusate Sodium) .Marland Kitchen.. 1 Tab By Mouth Two Times A Day For Constipation (While Taking Iron)  Allergies (verified): No Known Drug Allergies  Past History:  Past Medical History: Last updated: 04/18/2009 Obesity Internal Hemorrhoids (Dr Loreta Ave 02/2009) Sigmoid Diverticulosis (Dr Loreta Ave 02/2009) Iron Def Anemia MMSE 30/30 on 04/05/09  Past Surgical History: Last updated: 06/23/2009 Left total knee arthroplasty, computer-assisted, Jodi Geralds MD, April 19, 2009. Lateral retinacular release, left knee, Jodi Geralds MD. 03/2009 Left knee surgery 07/2007: fracture bone in knee Colonoscopy 02/2009 by Dr Loreta Ave  Family History: Last updated: 04/05/2009 Father - HTN, CVA age 2. died from aneurysm Mother - healthy, 51 yr old Brother- died from prostate cancer No colon cancer Neice with breast cancer (age 58) No gyn cancer  Social  History: Last updated: 02/15/2010 Married, lives with husband and grandson (pt adopted him when he was 86 days old, age 85 now).   Works at [employer]   5 children (youngest adopted- he doesn't know) smokes 1-2 pack per day until 2009 no EtOH Drug use-no  Risk Factors: Alcohol Use: 0 (04/05/2009) Exercise: no  (04/05/2009)  Risk Factors: Smoking Status: quit > 6 months (02/15/2010)  Social History: Married, lives with husband and grandson (pt adopted him when he was 19 days old, age 56 now).   Works at [employer]   5 children (youngest adopted- he doesn't know) smokes 1-2 pack per day until 2009 no EtOH Drug use-no  Review of Systems General:  Complains of fatigue; denies chills, fever, loss of appetite, weakness, and weight loss. CV:  Complains of weight gain; denies chest pain or discomfort, difficulty breathing at night, difficulty breathing while lying down, fainting, lightheadness, near fainting, palpitations, shortness of breath with exertion, and swelling of feet. Resp:  Denies cough, shortness of breath, and sputum productive. GI:  Denies abdominal pain, constipation, diarrhea, nausea, and vomiting. GU:  Denies abnormal vaginal bleeding and dysuria. Derm:  Complains of itching and rash; denies changes in color of skin, changes in nail beds, dryness, excessive perspiration, flushing, and hair loss.  Physical Exam  General:  Well-developed,well-nourished,in no acute distress; alert,appropriate and cooperative throughout examination. Vitals reviewed.  Neck:  No deformities, masses, or tenderness noted. Lungs:  Normal respiratory effort, chest expands symmetrically. Lungs are clear to auscultation, no crackles or wheezes. Heart:  Holosystolic murmur louder towards axilla.  Bradycardia, rate 60.  no JVD.  no bruit.  Abdomen:  Bowel sounds positive,abdomen soft and non-tender without masses, organomegaly or hernias noted. obese.  Pulses:  +2  bilaterally  Extremities:  No clubbing, cyanosis, edema, or deformity noted with normal full range of motion of all joints.   Neurologic:  alert & oriented X3 and cranial nerves II-XII intact.   Skin:  Right temporal area: there is a hyperpigmented (dark brown/black) linear area, size 4mm x 2.5 cm, mildly raised, scaly to touch.  Looks almost like an old burn mark from curling or flat iron.  No redness, edema, ulcerations, drainage.  Cervical Nodes:  No lymphadenopathy noted  Impression & Recommendations:  Problem # 1:  ESSENTIAL HYPERTENSION (ICD-401.9) Assessment Deteriorated During exam pt was found to be bradycardic and EKG showed sinus bradycardia.  Orthostatics were negative, but confirmed elevated BP.  Pt's BP is not at goal of 130/80.  She has been noncompliant with her medication.  She is interested in lowering BP so that she can take Phentermine to suppress her appetite.  I've advised her against taking this medication as it may worsen her hypertension.  Since she was bradycardic today, I will d/c metoprolol.  Given that SBP  ~160 she will need a minimum of two agents to lower her BP.  Will start with Lisinopril10-HCTZ 12.5 daily.  This med can easily be doubled to get to maxed dose if needed.  I would like to get Echo to evaluate cardiac function given uncontrolled HTN.  Pt to rtc to see me in 4 wks.  Will need CBC and Bmet.   The following medications were removed from the medication list:    Metoprolol Tartrate 50 Mg Tabs (Metoprolol tartrate) .Marland Kitchen... 1 tab by mouth two times a day for blood presure Her updated medication list for this problem includes:    Lisinopril-hydrochlorothiazide 10-12.5 Mg Tabs (Lisinopril-hydrochlorothiazide) .Marland Kitchen... 1 tab by mouth daily for blood pressure  Orders: Basic [facility] ([address]) [facility]- Est  Level 4 ([address]11)  Problem # 2:  BRADYCARDIA (ICD-427.89) Assessment: New Likely from metoprolol.  EKG showed sinus brady with rate 53.  Will stop BB.  I  would like to get Echo to assess for murmur (which pt has chronically but there is not documentation) and given that she has uncontrolled HTN will likely have LVH.    The following medications were removed from the medication list:    Metoprolol Tartrate 50 Mg Tabs (Metoprolol tartrate) .Marland Kitchen... 1 tab by mouth two times a day for blood presure Her updated medication list for this problem includes:    Aspir-low 81 Mg Tbec (Aspirin) .Marland Kitchen... 1 tab by mouth daily  Orders: [facility]- Est  Level 4 ([address]1478)  Problem # 3:  FACIAL RASH (ICD-782.1) Assessment: New Itchy, scaly rash sizen<1inch in lenght and < 4mm in height.  Will start with hydrocortisone cream.    Her updated medication list for this problem includes:    Hydrocortisone 0.5 % Crea (Hydrocortisone) .Marland Kitchen... Apply two times a day to affected area on face. dispense qs  Orders: [facility]- Est  Level 4 ([address]562)  Problem # 4:  ANEMIA, IRON DEFICIENCY (ICD-280.9) Assessment: Unchanged Pt has not been taking iron supplements due to constipation.  She did not take colace prescribed for constipation because she was concerned that it would stimulate her appetite.  Discussed with pt that colace will not cause appetite stimulation.  Pt states she will start taking it now.  ***Note: colonoscopy 02/2009 by Dr Loreta Ave was normal.    Her updated medication list for this problem includes:    Ferrous Sulfate 325 (65 Fe) Mg Tabs (Ferrous sulfate) .Marland Kitchen... 1 tab by mouth two times a day for iron deficiency  Orders: [facility]- Est  Level 4 (13086)  Complete Medication List: 1)  Ferrous Sulfate 325 (65 Fe) Mg Tabs (Ferrous sulfate) .Marland Kitchen.. 1 tab by mouth two times a day for iron deficiency 2)  Colace 100 Mg Caps (Docusate sodium) .Marland Kitchen.. 1 tab by mouth two times a day for constipation (while taking iron) 3)  Aspir-low 81 Mg Tbec (Aspirin) .Marland Kitchen.. 1 tab by mouth daily 4)  Hydrocortisone 0.5 % Crea (Hydrocortisone) .... Apply  two times a day to affected area on face. dispense qs 5)   Lisinopril-hydrochlorothiazide 10-12.5 Mg Tabs (Lisinopril-hydrochlorothiazide) .Marland Kitchen.. 1 tab by mouth daily for blood pressure  Other Orders: 2 D Echo (2 D Echo)  Patient Instructions: 1)  Please schedule a follow-up appointment in 1 month for blood pressure.  2)  STOP Metoprolol 3)  NEW MEDICINE: Hydrochlorothiazide 12.5mg -Lisinopril 10mg  take 1 tab daily 4)  Rash: use hydrocortisone cream two times a day  5)  Take your iron pill two times a day  6)  Take an Aspirin every day.  Prescriptions: LISINOPRIL-HYDROCHLOROTHIAZIDE 10-12.5 MG TABS (LISINOPRIL-HYDROCHLOROTHIAZIDE) 1 tab by mouth daily for blood pressure  #30 x 3   Entered and Authorized by:   Angeline Slim MD   Signed by:   Angeline Slim MD on 02/15/2010   Method used:   Electronically to        [facility]  [address] [id number] ([facility]ail)       [address]       [place], [place]  [address]       Ph: [phone] or [phone]       Fax: [phone]   RxID:   [id number] HYDROCORTISONE 0.5 % CREA (HYDROCORTISONE) apply two times a day to affected area on face. Dispense QS  #1 x 3   Entered and Authorized by:   Angeline Slim MD   Signed by:   Angeline Slim MD on 02/15/2010   Method used:   [facility]ronically to        [facility]  [address] [id number] ([facility])       [address]       [place], [place]  [address]       Ph: [phone] or [phone]       Fax: [phone]   RxID:   [id number] COLACE 100 MG CAPS (DOCUSATE SODIUM) 1 tab by mouth two times a day for constipation (while taking iron)  #66 x 11   Entered and Authorized by:   Angeline Slim MD   Signed by:   Angeline Slim MD on 02/15/2010   Method used:   [facility]ically to        [facility]  [address] [id number] ([facility]       [address]       [place], [place]  [address]       Ph: [phone] or [phone]       Fax: [phone]   RxID:   [id number] FERROUS SULFATE 325 (65 FE) MG TABS (FERROUS SULFATE) 1 tab by mouth two times a day for iron  deficiency  #66 x 11   Entered and Authorized by:   Angeline Slim MD   Signed by:   Angeline Slim MD on 02/15/2010   Method used:   [facility]ronically to        [facility]  [address] [id number]       [address]       [place], [place]  [address]       Ph: [phone] or [phone]       Fax: [phone]   RxID:   [id number] METOPROLOL TARTRATE 50 MG TABS (METOPROLOL TARTRATE) 1 tab by mouth two times a day for blood presure  #60 x 3   Entered and Authorized by:   Angeline Slim MD   Signed by:  Cat Ta MD on 02/15/2010   Method used:   [facility]ically to        [facility]  [address] [id number]       [address]       [place], [place]  [address]       Ph: [phone] or [phone]       Fax: [phone]   RxID:   [id number]    Orders Added: 1)  Basic Met[facility] [phone] 2)  [facility]- Est  Level 4 [[address]956] 3)  2 D Echo [2 D Echo]     Prevention & Chronic Care Immunizations   Influenza vaccine: Fluvax MCR  (12/14/2008)   Influenza vaccine deferral: Not indicated  (02/15/2010)    Tetanus booster: 06/03/2006: Td    Pneumococcal vaccine: Not documented    H. zoster vaccine: Not documented  Colorectal Screening   Hemoccult: Not documented   Hemoccult action/deferral: Not indicated  (03/09/2009)    Colonoscopy: Findings: Very redunctat colon, scattered sigmoid diverticulosis and prominent internal hemorrhoids, otherwise normal c-scope. Repeat C-scope in 10 yrs. EGD in 4-6 wks.  (03/01/2009)   Colonoscopy action/deferral: GI Referral  (12/14/2008)   Colonoscopy due: 03/02/2019  Other Screening   Pap smear: NEGATIVE FOR INTRAEPITHELIAL LESIONS OR MALIGNANCY.  (12/14/2008)   Pap smear action/deferral: Deferred-2 yr interval  (03/10/2009)   Pap smear due: 03/10/2010    Mammogram: BI-RADS CATEGORY 1:  Negative.^MM DIGITAL DIAG LTD R  (07/10/2009)   Mammogram action/deferral: Not indicated  (02/15/2010)   Mammogram due: 06/23/2009    DXA  bone density scan:  Lumbar Spine:  T Score > -1.0 Spine.  Hip Total: T Score > -1.0 Hip.    (12/20/2008)   DXA bone density action/deferral: Ordered  (12/14/2008)   Smoking status: quit > 6 months  (02/15/2010)  Lipids   Total Cholesterol: 159  (12/14/2008)   Lipid panel action/deferral: Not indicated   LDL: 89  (12/14/2008)   LDL Direct: Not documented   HDL: 54  (12/14/2008)   Triglycerides: 81  (12/14/2008)  Hypertension   Last Blood Pressure: 147 / 80  (02/15/2010)   Serum creatinine: 0.78  (04/05/2009)   Serum potassium 4.1  (04/05/2009)    Hypertension flowsheet reviewed?: Yes   Progress toward BP goal: At goal  Self-Management Support :   Personal Goals (by the next clinic visit) :      Personal blood pressure goal: 140/90  (02/15/2010)   Hypertension self-management support: Not documented

## 2010-04-19 HISTORY — PX: KNEE ARTHROPLASTY: SHX992

## 2010-05-16 LAB — CBC
HCT: 30.3 % — ABNORMAL LOW (ref 36.0–46.0)
HCT: 34.2 % — ABNORMAL LOW (ref 36.0–46.0)
Hemoglobin: 11.2 g/dL — ABNORMAL LOW (ref 12.0–15.0)
Hemoglobin: 9.7 g/dL — ABNORMAL LOW (ref 12.0–15.0)
Hemoglobin: 9.8 g/dL — ABNORMAL LOW (ref 12.0–15.0)
MCHC: 32.5 g/dL (ref 30.0–36.0)
MCHC: 32.7 g/dL (ref 30.0–36.0)
MCHC: 33.3 g/dL (ref 30.0–36.0)
MCV: 76.9 fL — ABNORMAL LOW (ref 78.0–100.0)
MCV: 77.3 fL — ABNORMAL LOW (ref 78.0–100.0)
Platelets: 184 10*3/uL (ref 150–400)
Platelets: 215 10*3/uL (ref 150–400)
Platelets: 218 10*3/uL (ref 150–400)
RBC: 3.82 MIL/uL — ABNORMAL LOW (ref 3.87–5.11)
RBC: 3.92 MIL/uL (ref 3.87–5.11)
RBC: 4.45 MIL/uL (ref 3.87–5.11)
RDW: 16.7 % — ABNORMAL HIGH (ref 11.5–15.5)
RDW: 16.9 % — ABNORMAL HIGH (ref 11.5–15.5)
RDW: 17.1 % — ABNORMAL HIGH (ref 11.5–15.5)
RDW: 17.5 % — ABNORMAL HIGH (ref 11.5–15.5)
WBC: 10.5 10*3/uL (ref 4.0–10.5)
WBC: 13.8 10*3/uL — ABNORMAL HIGH (ref 4.0–10.5)

## 2010-05-16 LAB — TYPE AND SCREEN
ABO/RH(D): A POS
Antibody Screen: POSITIVE
DAT, IgG: NEGATIVE
Donor AG Type: NEGATIVE
PT AG Type: NEGATIVE

## 2010-05-16 LAB — DIFFERENTIAL
Basophils Absolute: 0 10*3/uL (ref 0.0–0.1)
Basophils Relative: 0 % (ref 0–1)
Eosinophils Absolute: 0.3 10*3/uL (ref 0.0–0.7)
Eosinophils Relative: 3 % (ref 0–5)
Lymphocytes Relative: 21 % (ref 12–46)
Lymphs Abs: 2.2 10*3/uL (ref 0.7–4.0)
Monocytes Absolute: 0.7 10*3/uL (ref 0.1–1.0)
Monocytes Relative: 6 % (ref 3–12)
Neutro Abs: 7.3 10*3/uL (ref 1.7–7.7)
Neutrophils Relative %: 70 % (ref 43–77)

## 2010-05-16 LAB — COMPREHENSIVE METABOLIC PANEL
ALT: 32 U/L (ref 0–35)
AST: 25 U/L (ref 0–37)
Albumin: 3.6 g/dL (ref 3.5–5.2)
Alkaline Phosphatase: 80 U/L (ref 39–117)
BUN: 7 mg/dL (ref 6–23)
CO2: 28 mEq/L (ref 19–32)
Calcium: 9.2 mg/dL (ref 8.4–10.5)
Chloride: 106 mEq/L (ref 96–112)
Creatinine, Ser: 0.85 mg/dL (ref 0.4–1.2)
GFR calc Af Amer: >60 mL/min (ref >60)
GFR calc non Af Amer: >60 mL/min (ref >60)
Glucose, Bld: 94 mg/dL (ref 70–99)
Potassium: 4 mEq/L (ref 3.5–5.1)
Sodium: 140 mEq/L (ref 135–145)
Total Bilirubin: 0.6 mg/dL (ref 0.3–1.2)
Total Protein: 6.9 g/dL (ref 6.0–8.3)

## 2010-05-16 LAB — PROTIME-INR
INR: 1.05 (ref 0.00–1.49)
INR: 1.44 (ref 0.00–1.49)
INR: 2.38 — ABNORMAL HIGH (ref 0.00–1.49)
Prothrombin Time: 13.8 seconds (ref 11.6–15.2)
Prothrombin Time: 17.4 seconds — ABNORMAL HIGH (ref 11.6–15.2)

## 2010-05-16 LAB — BASIC METABOLIC PANEL
BUN: 11 mg/dL (ref 6–23)
CO2: 29 mEq/L (ref 19–32)
Calcium: 8.1 mg/dL — ABNORMAL LOW (ref 8.4–10.5)
Chloride: 103 mEq/L (ref 96–112)
Creatinine, Ser: 0.78 mg/dL (ref 0.4–1.2)
GFR calc Af Amer: >60 mL/min (ref >60)
GFR calc non Af Amer: >60 mL/min (ref >60)
Glucose, Bld: 145 mg/dL — ABNORMAL HIGH (ref 70–99)
Potassium: 4.2 mEq/L (ref 3.5–5.1)
Sodium: 135 mEq/L (ref 135–145)

## 2010-05-16 LAB — URINALYSIS, ROUTINE W REFLEX MICROSCOPIC
Nitrite: NEGATIVE
Specific Gravity, Urine: 1.015 (ref 1.005–1.030)
Urobilinogen, UA: 1 mg/dL (ref 0.0–1.0)
pH: 7 (ref 5.0–8.0)

## 2010-05-16 LAB — APTT: aPTT: 35 seconds (ref 24–37)

## 2010-05-16 LAB — URINE MICROSCOPIC-ADD ON

## 2010-05-29 ENCOUNTER — Encounter: Payer: Self-pay | Admitting: Home Health Services

## 2010-06-07 LAB — POCT I-STAT, CHEM 8
HCT: 37 % (ref 36.0–46.0)
Hemoglobin: 12.6 g/dL (ref 12.0–15.0)
Sodium: 138 mEq/L (ref 135–145)
TCO2: 23 mmol/L (ref 0–100)

## 2010-06-14 ENCOUNTER — Other Ambulatory Visit: Payer: Self-pay | Admitting: Family Medicine

## 2010-06-14 NOTE — Telephone Encounter (Signed)
Refill request

## 2010-06-22 ENCOUNTER — Other Ambulatory Visit: Payer: Self-pay | Admitting: Family Medicine

## 2010-06-22 DIAGNOSIS — Z1231 Encounter for screening mammogram for malignant neoplasm of breast: Secondary | ICD-10-CM

## 2010-07-06 ENCOUNTER — Ambulatory Visit: Payer: Medicare Other | Admitting: Home Health Services

## 2010-07-10 NOTE — Op Note (Signed)
[name] [name], [name] [name]                ACCOUNT NO.:  [id number]   MEDICAL RECORD NO.:  [id number]          PATIENT TYPE:  AMB   LOCATION:  [facility]                          FACILITY:  [facility]   PHYSICIAN:  Harvie Junior, M.D.   DATE OF BIRTH:  12-01-1940   DATE OF PROCEDURE:  08/12/2007  DATE OF DISCHARGE:                               OPERATIVE REPORT   PREOPERATIVE DIAGNOSIS:  Medial meniscal tear.   POSTOPERATIVE DIAGNOSES:  1. Lateral meniscal tear with large osteochondral defect in the      lateral femoral condyle with large osteocartilaginous loose body.  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  3. Chondromalacia of patellofemoral compartment.   PROCEDURES:  1. Partial lateral meniscectomy with corresponding debridement of the      large lateral femoral condylar defect.  2. Chondroplasty medial femoral condyle.  3. Chondroplasty to patellofemoral joint.  4. Excision of large osteocartilaginous loose body.   SURGEON:  Harvie Junior, MD   ASSISTANT:  None.   ANESTHESIA:  General.   BRIEF HISTORY:  [name] [name] is a 70 year old female with a long history  of having had significant left knee pain with recurrent effusions.  We  have seen her in the office and felt that she could have meniscal  pathology.  We ultimately did an injection into her knee.  She got  minimal relief with the injection and following this she ultimately  called [place] as she wanted to undergo operative knee arthroscopy.  We  discussed the possibility of my examination.  We felt we did not need it  is necessary to go on the arthroscopy and she is brought to the  operating room for this procedure.   PROCEDURE:  The patient was brought to the operating room.  After  adequate anesthesia was obtained using general anesthetic, the patient  was placed supine on the operating table.  The left leg was prepped and  draped in usual sterile fashion.  Following this, routine arthroscopic  examination of the knee  revealed that there was obvious chondromalacia  in the patellofemoral joint grade 2 in nature.  There was chondromalacia  in the medial femoral condyle grade 2 and 3 in nature and the meniscus  was in the medial side within normal limits.  The chondromalacia and  patellofemoral joint was debrided, chondromalacia and a medial femoral  condyle was debrided back to smooth and stable rim.  Attention was  turned to the notch where the ACL was normal.  Attention was turned to  the lateral compartment where the lateral meniscus was torn in the  midbody around anteriorly and then unfortunately there was a large  osteocartilaginous defect in the lateral femoral condyle.  This was  debrided back to a smooth and stable rim and did go around posteriorly  some that was probably about 2 cm x 15 mm.  It was then in a lateral  gutter, a large osteocartilaginous loose body and this was excised in  total with a grasper.  The lateral meniscus was then addressed back to a  smooth and stable rim.  Attention was turned to the lateral meniscus on  to this defect as we could.  Once  this was completed, the knee was copiously and thoroughly irrigated and  suctioned dry.  The arthroscope portals were closed with bandage.  Sterile compressive dressing was applied.  The patient taken to recovery  room and she was noted to be in satisfactory condition.  Estimated blood  loss throughout the procedure was none.      Harvie Junior, M.D.  Electronically Signed     JLG/MEDQ  D:  08/12/2007  T:  08/13/2007  Job:  [id number]

## 2010-07-12 ENCOUNTER — Ambulatory Visit (HOSPITAL_COMMUNITY)
Admission: RE | Admit: 2010-07-12 | Discharge: 2010-07-12 | Disposition: A | Discharge status: Home / Self Care | Payer: Medicare Other | Source: Ambulatory Visit | Attending: Family Medicine | Admitting: Family Medicine

## 2010-07-12 DIAGNOSIS — Z1231 Encounter for screening mammogram for malignant neoplasm of breast: Secondary | ICD-10-CM | POA: Insufficient documentation

## 2010-07-20 ENCOUNTER — Ambulatory Visit (INDEPENDENT_AMBULATORY_CARE_PROVIDER_SITE_OTHER): Payer: Medicare Other | Admitting: Family Medicine

## 2010-07-20 ENCOUNTER — Encounter: Payer: Self-pay | Admitting: Family Medicine

## 2010-07-20 VITALS — BP 130/64 | HR 80 | Temp 97.8°F | Ht 62.25 in | Wt 190.9 lb

## 2010-07-20 DIAGNOSIS — D509 Iron deficiency anemia, unspecified: Secondary | ICD-10-CM

## 2010-07-20 DIAGNOSIS — F17200 Nicotine dependence, unspecified, uncomplicated: Secondary | ICD-10-CM

## 2010-07-20 DIAGNOSIS — I10 Essential (primary) hypertension: Secondary | ICD-10-CM

## 2010-07-20 DIAGNOSIS — Z23 Encounter for immunization: Secondary | ICD-10-CM

## 2010-07-20 MED ORDER — LISINOPRIL-HYDROCHLOROTHIAZIDE 10-12.5 MG PO TABS: 1.0000 | ORAL_TABLET | Freq: Every day | ORAL | Status: DC

## 2010-07-20 NOTE — Progress Notes (Signed)
  Subjective:    Patient ID: [name] [name], female    DOB: [birth year], 70 y.o.   MRN: [id number]  HPI Pt is here for complete physical exam. LMP: she is post menopausal. She is in a monogamous relationship with her husband.     Review of Systems  Constitutional: Negative for fever, chills, appetite change and unexpected weight change.  HENT: Negative for hearing loss, rhinorrhea and sneezing.   Eyes: Negative for discharge, itching and visual disturbance.  Respiratory: Negative for cough, chest tightness, shortness of breath and wheezing.   Cardiovascular: Negative for chest pain, palpitations and leg swelling.  Gastrointestinal: Negative for nausea, vomiting, abdominal pain, diarrhea, constipation, blood in stool and abdominal distention.  Genitourinary: Negative for dysuria, hematuria, vaginal bleeding, vaginal discharge and dyspareunia.  Musculoskeletal: Negative for myalgias, arthralgias and gait problem.  Skin: Negative for rash and wound.  Neurological: Negative for syncope, weakness, light-headedness, numbness and headaches.       Objective:   Physical Exam  Constitutional: She is oriented to person, place, and time. She appears well-developed and well-nourished. No distress.  HENT:  Head: Normocephalic and atraumatic.  Right Ear: External ear normal.  Left Ear: External ear normal.  Nose: Nose normal.  Mouth/Throat: Oropharynx is clear and moist. No oropharyngeal exudate.  Eyes: Conjunctivae and EOM are normal. Pupils are equal, round, and reactive to light.  Neck: Normal range of motion. Neck supple. No thyromegaly present.  Cardiovascular: Normal rate, regular rhythm and intact distal pulses.   Murmur heard. Pulmonary/Chest: Effort normal and breath sounds normal. She has no wheezes. She has no rales.  Abdominal: Soft. Bowel sounds are normal. She exhibits no distension. There is no tenderness.  Musculoskeletal: Normal range of motion. She exhibits no edema and no  tenderness.  Lymphadenopathy:    She has no cervical adenopathy.  Neurological: She is alert and oriented to person, place, and time. She has normal reflexes.  Skin: Skin is warm and dry. No rash noted.  Psychiatric: She has a normal mood and affect.          Assessment & Plan:

## 2010-07-20 NOTE — Assessment & Plan Note (Signed)
Quit smoking years ago.  No interest on restarting.

## 2010-07-23 NOTE — Assessment & Plan Note (Signed)
BP stable and at goal. Will continue Lisinopril 10-HCTZ 12.5 daily.

## 2010-07-23 NOTE — Assessment & Plan Note (Signed)
Pt has not been taking her iron supplements. States she will start taking it again.

## 2010-11-02 ENCOUNTER — Emergency Department (HOSPITAL_COMMUNITY)
Admission: EM | Admit: 2010-11-02 | Discharge: 2010-11-02 | Discharge status: Against Medical Advice | Payer: Medicare Other | Attending: Emergency Medicine | Admitting: Emergency Medicine

## 2010-11-02 DIAGNOSIS — R040 Epistaxis: Secondary | ICD-10-CM | POA: Insufficient documentation

## 2010-11-02 DIAGNOSIS — I10 Essential (primary) hypertension: Secondary | ICD-10-CM | POA: Insufficient documentation

## 2010-11-04 ENCOUNTER — Emergency Department (HOSPITAL_COMMUNITY)
Admission: EM | Admit: 2010-11-04 | Discharge: 2010-11-04 | Discharge status: Against Medical Advice | Payer: Medicare Other | Attending: Emergency Medicine | Admitting: Emergency Medicine

## 2010-11-04 DIAGNOSIS — R040 Epistaxis: Secondary | ICD-10-CM | POA: Insufficient documentation

## 2010-11-07 ENCOUNTER — Encounter: Payer: Self-pay | Admitting: Family Medicine

## 2010-11-07 ENCOUNTER — Ambulatory Visit (INDEPENDENT_AMBULATORY_CARE_PROVIDER_SITE_OTHER): Payer: Medicare Other | Admitting: Family Medicine

## 2010-11-07 VITALS — BP 124/73 | HR 71 | Temp 98.5°F | Ht 62.0 in | Wt 191.0 lb

## 2010-11-07 DIAGNOSIS — T44905A Adverse effect of unspecified drugs primarily affecting the autonomic nervous system, initial encounter: Secondary | ICD-10-CM

## 2010-11-07 DIAGNOSIS — I10 Essential (primary) hypertension: Secondary | ICD-10-CM

## 2010-11-07 DIAGNOSIS — T464X5A Adverse effect of angiotensin-converting-enzyme inhibitors, initial encounter: Secondary | ICD-10-CM | POA: Insufficient documentation

## 2010-11-07 DIAGNOSIS — R05 Cough: Secondary | ICD-10-CM

## 2010-11-07 DIAGNOSIS — R040 Epistaxis: Secondary | ICD-10-CM

## 2010-11-07 DIAGNOSIS — R059 Cough, unspecified: Secondary | ICD-10-CM

## 2010-11-07 MED ORDER — LOSARTAN POTASSIUM-HCTZ 50-12.5 MG PO TABS: 1.0000 | ORAL_TABLET | Freq: Every day | ORAL | Status: DC

## 2010-11-07 NOTE — Patient Instructions (Signed)
Thank you for coming in today. If you still are having nose bleeds let Dr. Edmonia James know.  I think it would make sense to see a ENT doctor.  I changed your blood pressure meds to Losartan/HCTZ 50/12.5  This should be pretty much like the old medicine but not cause coughing.  Stop by the office in 1 month for a nurse blood pressure check.  See Dr. Edmonia James in 3-6 months for a checkup.

## 2010-11-07 NOTE — Progress Notes (Signed)
Problem #1: Nosebleeds: [name] [name] has had nosebleeds for 6 months.  When they first started she had difficulty controlling her nosebleeds. However she had an interval period where she had very few and easy to control nosebleeds.  However this weekend she had a series of nosebleeds that were very difficult to control.  They all occurred from the left nostril.  She presented to the emergency room twice over the weekend and had one visit to urgent care. While at [facility] urgent care she had a silver nitrate ablation. However she then blew her nose and had some minor bleeding was able to be controlled at home. She feels well otherwise and denies any lightheadedness.  #2: Cough, has an ongoing dry cough for 4 months. This started when she was started on lisinopril. She suspects her blood pressure medication is the cause of her cough. She denies any rhinorrhea itchy eyes fevers or chills.  PMH reviewed.  ROS as above otherwise neg Medications reviewed.  Exam:  BP 124/73  Pulse 71  Temp(Src) 98.5 F (36.9 C) (Oral)  Ht 5\' 2"  (1.575 m)  Wt 191 lb (86.637 kg)  BMI 34.93 kg/m2 Gen: Well NAD HEENT: EOMI,  MMM.  Turbinates are moist and normal appearing.  On the medial aspect of the left nostril there is a cluster of superficial blood vessels with evidence of recent silver nitrate application.   Lungs: CTABL Nl WOB Abd: NABS, NT, ND Exts: Non edematous BL  LE, warm and well perfused.    Superficial blood vessel ablation: Area identified on medial aspect of nostril.  Silver nitrate applied. Patient did well.

## 2010-11-07 NOTE — Assessment & Plan Note (Signed)
I think her cough is likely due to her lisinopril. Plan the switched to losartan/hydrochlorothiazide combination. Will use 50 mg losartan. Patient will follow up for nurse blood pressure check in one month and will followup with Dr. Edmonia James in 3-6 months.

## 2010-11-07 NOTE — Assessment & Plan Note (Signed)
I re-ablated the superficial blood vessels in her left nostril with silver nitrate. If nosebleeds recur would recommend referral to ENT for further management.   Patient expresses understanding.

## 2010-11-22 LAB — POCT HEMOGLOBIN-HEMACUE: Hemoglobin: 11.1 — ABNORMAL LOW

## 2011-03-26 ENCOUNTER — Other Ambulatory Visit: Payer: Self-pay | Admitting: Family Medicine

## 2011-03-26 MED ORDER — FERROUS SULFATE 325 (65 FE) MG PO TABS: 325.0000 mg | ORAL_TABLET | Freq: Two times a day (BID) | ORAL | Status: DC

## 2011-03-26 MED ORDER — DOCUSATE SODIUM 100 MG PO CAPS: 100.0000 mg | ORAL_CAPSULE | Freq: Two times a day (BID) | ORAL | Status: DC

## 2011-06-16 ENCOUNTER — Other Ambulatory Visit: Payer: Self-pay | Admitting: Family Medicine

## 2011-06-17 ENCOUNTER — Other Ambulatory Visit (HOSPITAL_COMMUNITY): Payer: Self-pay | Admitting: Family Medicine

## 2011-06-17 DIAGNOSIS — Z1231 Encounter for screening mammogram for malignant neoplasm of breast: Secondary | ICD-10-CM

## 2011-06-25 ENCOUNTER — Other Ambulatory Visit: Payer: Self-pay | Admitting: Family Medicine

## 2011-07-15 ENCOUNTER — Ambulatory Visit (HOSPITAL_COMMUNITY)
Admission: RE | Admit: 2011-07-15 | Discharge: 2011-07-15 | Disposition: A | Discharge status: Home / Self Care | Payer: Medicare Other | Source: Ambulatory Visit | Attending: Family Medicine | Admitting: Family Medicine

## 2011-07-15 DIAGNOSIS — Z1231 Encounter for screening mammogram for malignant neoplasm of breast: Secondary | ICD-10-CM

## 2011-07-17 ENCOUNTER — Other Ambulatory Visit: Payer: Self-pay | Admitting: Family Medicine

## 2011-07-17 DIAGNOSIS — R928 Other abnormal and inconclusive findings on diagnostic imaging of breast: Secondary | ICD-10-CM

## 2011-07-30 ENCOUNTER — Ambulatory Visit
Admission: RE | Admit: 2011-07-30 | Discharge: 2011-07-30 | Disposition: A | Discharge status: Home / Self Care | Payer: Medicare Other | Source: Ambulatory Visit | Attending: Family Medicine | Admitting: Family Medicine

## 2011-07-30 DIAGNOSIS — R928 Other abnormal and inconclusive findings on diagnostic imaging of breast: Secondary | ICD-10-CM

## 2011-08-27 ENCOUNTER — Ambulatory Visit (INDEPENDENT_AMBULATORY_CARE_PROVIDER_SITE_OTHER): Payer: Medicare Other | Admitting: Family Medicine

## 2011-08-27 ENCOUNTER — Encounter: Payer: Self-pay | Admitting: Family Medicine

## 2011-08-27 VITALS — BP 124/84 | HR 87 | Ht 62.0 in | Wt 193.0 lb

## 2011-08-27 DIAGNOSIS — E663 Overweight: Secondary | ICD-10-CM

## 2011-08-27 DIAGNOSIS — M1710 Unilateral primary osteoarthritis, unspecified knee: Secondary | ICD-10-CM

## 2011-08-27 DIAGNOSIS — M1990 Unspecified osteoarthritis, unspecified site: Secondary | ICD-10-CM

## 2011-08-27 DIAGNOSIS — D649 Anemia, unspecified: Secondary | ICD-10-CM

## 2011-08-27 DIAGNOSIS — IMO0002 Reserved for concepts with insufficient information to code with codable children: Secondary | ICD-10-CM

## 2011-08-27 DIAGNOSIS — I10 Essential (primary) hypertension: Secondary | ICD-10-CM

## 2011-08-27 DIAGNOSIS — D509 Iron deficiency anemia, unspecified: Secondary | ICD-10-CM

## 2011-08-27 LAB — COMPLETE METABOLIC PANEL WITH GFR
ALT: 16 U/L (ref 0–35)
Alkaline Phosphatase: 75 U/L (ref 39–117)
Sodium: 140 mEq/L (ref 135–145)
Total Bilirubin: 0.3 mg/dL (ref 0.3–1.2)
Total Protein: 7.3 g/dL (ref 6.0–8.3)

## 2011-08-27 LAB — CBC WITH DIFFERENTIAL/PLATELET
Basophils Relative: 0 % (ref 0–1)
Eosinophils Absolute: 0.4 10*3/uL (ref 0.0–0.7)
HCT: 37.7 % (ref 36.0–46.0)
Hemoglobin: 12.2 g/dL (ref 12.0–15.0)
Lymphs Abs: 2.3 10*3/uL (ref 0.7–4.0)
MCH: 24.4 pg — ABNORMAL LOW (ref 26.0–34.0)
MCHC: 32.4 g/dL (ref 30.0–36.0)
Monocytes Absolute: 0.4 10*3/uL (ref 0.1–1.0)
Monocytes Relative: 5 % (ref 3–12)
Neutro Abs: 4.6 10*3/uL (ref 1.7–7.7)

## 2011-08-27 MED ORDER — FERROUS SULFATE 325 (65 FE) MG PO TABS: 325.0000 mg | ORAL_TABLET | Freq: Two times a day (BID) | ORAL | Status: DC

## 2011-08-27 MED ORDER — IBUPROFEN 600 MG PO TABS: 600.0000 mg | ORAL_TABLET | Freq: Three times a day (TID) | ORAL | Status: AC | PRN

## 2011-08-27 NOTE — Assessment & Plan Note (Signed)
Patient to obtain lipid panel from previous practice obtained this year. If unable to obtain records, will redraw fasting lipid panel

## 2011-08-27 NOTE — Assessment & Plan Note (Signed)
Check CBC. Currently on ferrous sulfate

## 2011-08-27 NOTE — Assessment & Plan Note (Signed)
Controled. Continue losartan-HCTZ 50-12.5. Check BMP for K and Cr levels.

## 2011-08-27 NOTE — Assessment & Plan Note (Addendum)
Prescribed ibuprofen 600mg  as needed every 8hrs. Alternate with tylenol.  Also referred for PT. Follow up in 2 months.

## 2011-08-27 NOTE — Patient Instructions (Addendum)
It was great meeting you today!  For the knee, I am prescribing you some ibuprofen 600mg  to take every 8 hours as needed for pain. You can alternate that with tylenol 325mg  (you can get it over the counter) every  6 hours. Make sure you eat something when you take the ibuprofen. I will also send a referral for physical therapy.  If you could, it would be great if you could send [place] the lab reports that you had done 7 months ago for your cholesterol.

## 2011-08-27 NOTE — Progress Notes (Signed)
Patient ID: [name] [name], female   DOB: [birth year], 72 y.o.   MRN: [id number]   Chief Complaint  Patient presents with  . Hypertension   HPI [name] [name] is a 71 y.o. female who presents to re-establish care.  Concerns include the following:  - right knee pain: Onset: 5 years ago, worst in the last 1.5 years Ashby Dawes of pain: sharp ranging from above knee to below knee, not really worst with walking Exacerbating factors: none Relieving: ibuprofen 200mg  Associated symptoms: no weakness, no radiation of pain Past Medical History  Diagnosis Date  . Hypertension   . Anemia     Fe def  . Postmenopausal status   . Arthritis     Past Surgical History  Procedure Date  . Knee arthroplasty 04/19/10    Dr Luiz Blare  . Cesarean section     x2  . Internal hemorrhoids   . Sigmoid diverticulosis   . Joint replacement 2011    left knee replacement  . Tubal ligation 1978    Family History  Problem Relation Age of Onset  . Hypertension Father   . Stroke Father   . Prostate cancer Brother     Social History History  Substance Use Topics  . Smoking status: Former Smoker -- 1.0 packs/day for 33 years    Types: Cigarettes  . Smokeless tobacco: Never Used  . Alcohol Use: No    No Known Allergies  Current Outpatient Prescriptions  Medication Sig Dispense Refill  . aspirin 81 MG tablet Take 81 mg by mouth daily.        Marland Kitchen docusate sodium (COLACE) 100 MG capsule Take 1 capsule (100 mg total) by mouth 2 (two) times daily. for constipation (while taking iron)  60 capsule  1  . ferrous sulfate 325 (65 FE) MG tablet Take 1 tablet (325 mg total) by mouth 2 (two) times daily. for iron deficiency-- Needs MD appt.  60 tablet  3  . ibuprofen (ADVIL,MOTRIN) 600 MG tablet Take 1 tablet (600 mg total) by mouth every 8 (eight) hours as needed for pain.  30 tablet  2  . losartan-hydrochlorothiazide (HYZAAR) 50-12.5 MG per tablet Take 1 tablet by mouth daily.  30 tablet  6  . DISCONTD: ferrous sulfate  325 (65 FE) MG tablet Take 1 tablet (325 mg total) by mouth 2 (two) times daily. for iron deficiency-- Needs MD appt.  60 tablet  3    Review of Systems Review of Systems  Constitutional: Negative for chills, appetite change and fatigue.  Eyes: Negative for pain.  Respiratory: Positive for shortness of breath.        Sob unchanged in the last year. Gets winded when walking longer distances.   Cardiovascular: Negative for chest pain, palpitations and leg swelling.  Gastrointestinal: Negative for constipation and blood in stool.  Genitourinary: Negative for difficulty urinating.  Musculoskeletal:       Knee pain right   Blood pressure 124/84, pulse 87, height 5\' 2"  (1.575 m), weight 193 lb (87.544 kg).  Physical Exam Physical Exam General: no acute distress, pleasant and cooperative HEENT: moist mucous membranes, oropharynx clear CV: s1s2, holosystolic murmur heard throughout pericardium, radiating to axilla, no radiation to carotids, rrr Pulm: cta b/l, normal work of breathing GI: soft, non tender, non distended Extr: no edema, dorsalis pedis pulses present b/l Skin: warm and dry, no rashes.  Knee: deformed right knee, valgus position, no tenderness along patella, normal range of motion 5/5 strength bilaterally for knee extension  and flexion, no crepitus appreciated. Left knee with scar.  Data Reviewed Echo 2011:  - Normal LV size and systolic function, EF 55-60%. Moderate diastolic dysfunction. Thickened mitral leaflets with partial flail of the P2 segment and eccentric mitral regurgitation that may be severe. Suggest TEE to further evaluate.     Assessment    See problem list    Plan See problem list          [name] [name] 08/27/2011, 5:00 PM

## 2011-08-30 ENCOUNTER — Encounter: Payer: Self-pay | Admitting: Family Medicine

## 2011-09-25 ENCOUNTER — Ambulatory Visit: Payer: Medicare Other | Attending: Family Medicine | Admitting: Physical Therapy

## 2011-09-25 DIAGNOSIS — M25569 Pain in unspecified knee: Secondary | ICD-10-CM | POA: Insufficient documentation

## 2011-09-25 DIAGNOSIS — IMO0001 Reserved for inherently not codable concepts without codable children: Secondary | ICD-10-CM | POA: Insufficient documentation

## 2011-09-25 DIAGNOSIS — Z96659 Presence of unspecified artificial knee joint: Secondary | ICD-10-CM | POA: Insufficient documentation

## 2012-06-08 ENCOUNTER — Other Ambulatory Visit (HOSPITAL_COMMUNITY): Payer: Self-pay | Admitting: Family Medicine

## 2012-06-08 DIAGNOSIS — Z1231 Encounter for screening mammogram for malignant neoplasm of breast: Secondary | ICD-10-CM

## 2012-06-26 ENCOUNTER — Other Ambulatory Visit: Payer: Self-pay | Admitting: Family Medicine

## 2012-07-29 ENCOUNTER — Encounter: Payer: Self-pay | Admitting: Family Medicine

## 2012-07-29 ENCOUNTER — Ambulatory Visit (INDEPENDENT_AMBULATORY_CARE_PROVIDER_SITE_OTHER): Payer: Medicare Other | Admitting: Family Medicine

## 2012-07-29 VITALS — BP 118/77 | HR 94 | Ht 62.0 in | Wt 189.0 lb

## 2012-07-29 DIAGNOSIS — R948 Abnormal results of function studies of other organs and systems: Secondary | ICD-10-CM

## 2012-07-29 DIAGNOSIS — K6289 Other specified diseases of anus and rectum: Secondary | ICD-10-CM

## 2012-07-29 DIAGNOSIS — R011 Cardiac murmur, unspecified: Secondary | ICD-10-CM

## 2012-07-29 DIAGNOSIS — R937 Abnormal findings on diagnostic imaging of other parts of musculoskeletal system: Secondary | ICD-10-CM

## 2012-07-29 DIAGNOSIS — M8580 Other specified disorders of bone density and structure, unspecified site: Secondary | ICD-10-CM

## 2012-07-29 DIAGNOSIS — I10 Essential (primary) hypertension: Secondary | ICD-10-CM

## 2012-07-29 DIAGNOSIS — M899 Disorder of bone, unspecified: Secondary | ICD-10-CM

## 2012-07-29 NOTE — Patient Instructions (Addendum)
For the rectal pain, if it gets any worst, please return to the clinic.  For now, do sitz baths to help with the inflammation.   We are also going to check a bone scan.  Please fax [place] the lab results. I will contact you if we need to get anymore labwork.   Sitz Bath A sitz bath is a warm water bath taken in the sitting position that covers only the hips and buttocks. It may be used for either healing or hygiene purposes. Sitz baths are also used to relieve pain, itching, or muscle spasms. The water may contain medicine. Moist heat will help you heal and relax.  HOME CARE INSTRUCTIONS  Take 3 to 4 sitz baths a day. 1. Fill the bathtub half full with warm water. 2. Sit in the water and open the drain a little. 3. Turn on the warm water to keep the tub half full. Keep the water running constantly. 4. Soak in the water for 15 to 20 minutes. 5. After the sitz bath, pat the affected area dry first. SEEK MEDICAL CARE IF:  You get worse instead of better. Stop the sitz baths if you get worse. MAKE SURE YOU:  Understand these instructions.  Will watch your condition.  Will get help right away if you are not doing well or get worse. Document Released: 11/04/2003 Document Revised: 11/06/2011 Document Reviewed: 05/11/2010 [facility] Patient Information 2014 [place], [place].

## 2012-07-29 NOTE — Progress Notes (Signed)
Patient ID: [name]    DOB: November 30, 1940, 72 y.o.   MRN: [id number] --- Subjective:  [name] [name] is a 72 y.o.female who presents for routine checkup.  Her concerns include the following:  - rectal pain: occurs about twice a month for 2 months. Occurs while she is asleep and is not associated with bowel movement. Pain lasts 30-48minutes when it occurs and subsides on its own. Pain is described as a throbbing pain. This has never happened to her before. She denies any hard stools. Denies rectal bleeding.  - HTN: ran out of her losartan/hctz 50-12.5 and was seen at a local community clinic that ran [facility] before being able to refill the medicine. She has otherwise been taking her medicine on a regular basis. Doesn't check BP at home. No  Chest pain, no shortness of breath, no lower extremity swelling.   Health Maintenance:  - mammogram scheduled on 07/30/12 - Colonoscopy: done by Dr. Loreta Ave at [facility] on 03/01/09 showed scattered sigmoid diverticulosis and prominent internal hemorrhoids.  - Dexa: 12/20/2008; normal.   ROS: see HPI Past Medical History: reviewed and updated medications and allergies. Social History: Tobacco: 30 pack year history but quit smoking a few years ago  Objective: Filed Vitals:   07/29/12 1325  BP: 118/77  Pulse: 94    Physical Examination:   General appearance - alert, well appearing, and in no distress Neck - supple, no significant adenopathy Chest - clear to auscultation, no wheezes, rales or rhonchi, symmetric air entry Heart -S1S2, RRR, 3/6 holosystolic murmur heard throughout pericardium, more at the right sternal border Abdomen - soft, nontender, nondistended, no masses or organomegaly Extremities - no pedal edema Rectal exam - external non thrombosed hemorrhoid, normal rectal tone, no internal hemorrhoids appreciated on digital exam.

## 2012-07-30 ENCOUNTER — Ambulatory Visit (HOSPITAL_COMMUNITY)
Admission: RE | Admit: 2012-07-30 | Discharge: 2012-07-30 | Disposition: A | Discharge status: Home / Self Care | Payer: Medicare Other | Source: Ambulatory Visit | Attending: Family Medicine | Admitting: Family Medicine

## 2012-07-30 DIAGNOSIS — Z1231 Encounter for screening mammogram for malignant neoplasm of breast: Secondary | ICD-10-CM | POA: Insufficient documentation

## 2012-07-30 DIAGNOSIS — K6289 Other specified diseases of anus and rectum: Secondary | ICD-10-CM | POA: Insufficient documentation

## 2012-07-30 NOTE — Assessment & Plan Note (Addendum)
Occasional pain for 2 months. Proctalgia fugax vs hemorrhoids. Colonoscopy from 2011 was normal except for diverticulosis and internal hemorrhoids.  Will await for records of community clinic to see if CBC was done. If not, would like to repeat it, to rule out significant anemia. For now, will treat with sitz baths. No constipation, so will hold on stool softeners. If worst or not better, patient to return to clinic. May need GI consult at that time.

## 2012-07-30 NOTE — Assessment & Plan Note (Signed)
Controled on  losartan/hctz 50-12.5 without side effects. Patient to have faxed the labwork from the practice where she had medicine refilled. If unable to obtain labwork, will obtain BMP and fasting lipid panel as well as CBC.

## 2012-07-30 NOTE — Assessment & Plan Note (Signed)
Holosystolic murmur present on exam, present in 2011 upon note review. Patient had echo in 2011 which showed Normal LV size and systolic function, EF 55-60%. Moderate diastolic dysfunction. Thickened mitral leaflets with partial flail of the P2 segment and eccentric mitral regurgitation that may be severe.  Cannot find record of TEE which was suggested.  Patient is asymptomatic at this time. If shortness of breath or significant fatigue, will repeat echo and consider TEE based on the results.

## 2012-08-05 ENCOUNTER — Other Ambulatory Visit: Payer: Medicare Other

## 2012-08-14 ENCOUNTER — Other Ambulatory Visit: Payer: Medicare Other

## 2012-08-24 ENCOUNTER — Ambulatory Visit
Admission: RE | Admit: 2012-08-24 | Discharge: 2012-08-24 | Disposition: A | Discharge status: Home / Self Care | Payer: Medicare Other | Source: Ambulatory Visit | Attending: Family Medicine | Admitting: Family Medicine

## 2012-08-24 DIAGNOSIS — M8580 Other specified disorders of bone density and structure, unspecified site: Secondary | ICD-10-CM

## 2012-09-30 ENCOUNTER — Telehealth: Payer: Self-pay | Admitting: *Deleted

## 2012-09-30 NOTE — Telephone Encounter (Signed)
Pt would like to know results of bone density test - reported that they were normal and also states that she would like to see Dr. Gwenlyn Saran for lower back pain - appointment made. Wyatt Haste, RN-BSN

## 2012-10-12 ENCOUNTER — Ambulatory Visit (INDEPENDENT_AMBULATORY_CARE_PROVIDER_SITE_OTHER): Payer: Medicare Other | Admitting: Family Medicine

## 2012-10-12 VITALS — BP 114/71 | HR 88 | Temp 98.5°F | Ht 62.0 in | Wt 190.0 lb

## 2012-10-12 DIAGNOSIS — M545 Low back pain, unspecified: Secondary | ICD-10-CM | POA: Insufficient documentation

## 2012-10-12 DIAGNOSIS — E663 Overweight: Secondary | ICD-10-CM

## 2012-10-12 DIAGNOSIS — R197 Diarrhea, unspecified: Secondary | ICD-10-CM

## 2012-10-12 DIAGNOSIS — R350 Frequency of micturition: Secondary | ICD-10-CM

## 2012-10-12 LAB — CBC WITH DIFFERENTIAL/PLATELET
Eosinophils Absolute: 0.4 10*3/uL (ref 0.0–0.7)
Eosinophils Relative: 4 % (ref 0–5)
HCT: 35.3 % — ABNORMAL LOW (ref 36.0–46.0)
Lymphocytes Relative: 23 % (ref 12–46)
Lymphs Abs: 2 10*3/uL (ref 0.7–4.0)
MCH: 24.3 pg — ABNORMAL LOW (ref 26.0–34.0)
MCV: 75.1 fL — ABNORMAL LOW (ref 78.0–100.0)
Monocytes Absolute: 0.6 10*3/uL (ref 0.1–1.0)
Platelets: 253 10*3/uL (ref 150–400)
RBC: 4.7 MIL/uL (ref 3.87–5.11)
WBC: 8.6 10*3/uL (ref 4.0–10.5)

## 2012-10-12 LAB — POCT URINALYSIS DIPSTICK
Blood, UA: NEGATIVE
Ketones, UA: NEGATIVE
Protein, UA: NEGATIVE
Spec Grav, UA: 1.02
Urobilinogen, UA: 0.2

## 2012-10-12 LAB — COMPREHENSIVE METABOLIC PANEL
BUN: 17 mg/dL (ref 6–23)
CO2: 27 mEq/L (ref 19–32)
Calcium: 8.7 mg/dL (ref 8.4–10.5)
Chloride: 106 mEq/L (ref 96–112)
Creat: 0.95 mg/dL (ref 0.50–1.10)
Glucose, Bld: 92 mg/dL (ref 70–99)
Total Bilirubin: 0.3 mg/dL (ref 0.3–1.2)

## 2012-10-12 LAB — LIPID PANEL
Cholesterol: 173 mg/dL (ref 0–200)
HDL: 43 mg/dL (ref >39)
Total CHOL/HDL Ratio: 4 Ratio
Triglycerides: 96 mg/dL (ref <150)
VLDL: 19 mg/dL (ref 0–40)

## 2012-10-12 NOTE — Assessment & Plan Note (Signed)
Likely back sprain, but given ongoing nature for the last few weeks, will get xray to rule out lytic lesions.

## 2012-10-12 NOTE — Progress Notes (Signed)
Patient ID: [name] [name]    DOB: 09-Jan-1941, 72 y.o.   MRN: [id number] --- Subjective:  [name] [name] is a 72 y.o.female who presents with the following concerns: - back pain: located at the center of lower back. Pain is a nagging ache, only occurs with movement and walking. Resolves at rest or with laying down. No lower extremity weakness, no urine or bowel incontinence. She has tried tylenol which did not help. She also has been taking equate arthritis medication which does help.   - diarrhea: increased stool frequency in the last month. Has a bowel movement every time she eats, no matter what type of food. She also has bowel movements independent of eating. Has 6-8 bowel movements per day. No blood in stool, no melena, no abdominal pain. No nausea, no vomiting. No fevers or chills. No recent antibiotic use.   ROS: see HPI Past Medical History: reviewed and updated medications and allergies. Social History: Tobacco: cigarettes  Objective: Filed Vitals:   10/12/12 0944  BP: 114/71  Pulse: 88  Temp: 98.5 F (36.9 C)    Physical Examination:   General appearance - alert, well appearing, and in no distress Abdomen - soft, nontender, nondistended, no masses or organomegaly Rectal exam - normal rectal tone, no stool burden, no evidence of hemorrhoids. poct guaiac was negative but did not have much sample for testing.  Back - no tenderness to palpation along spine or paraspinal muscles, normal flexion and extension of back, negative straight leg, 4+/5 strength with knee flexion, extension and dorsi and plantarflexion of foot bilaterally

## 2012-10-12 NOTE — Assessment & Plan Note (Signed)
Lipid panel today

## 2012-10-12 NOTE — Assessment & Plan Note (Signed)
Ongoing for 1 month, not associated with abdominal pain. Unclear etiology. LEss likely diverticulitis given lack of pain. Less likely c-diff given no antibiotic treatment or recent hospitalization. Doesn't appear to be associated with particular kinds of foods. At this point, will treat like IBS. Treat with increased fiber: miralax. Follow up if not better. If not improved, refer back to her gastroenterologist who performed colonoscopy.  - will obtain CBC for white count and Hg as well as CMP.

## 2012-10-12 NOTE — Patient Instructions (Addendum)
Continue with the arthritis medicine unless I call you about it.  We will get an xray of your back.   For the diarrhea, try taking metamucil to increase the fiber. Also, see if there are any particular foods that trigger the diarrhea. For the bloating, try gas x.  Please follow up in 1 month.

## 2012-10-14 ENCOUNTER — Ambulatory Visit
Admission: RE | Admit: 2012-10-14 | Discharge: 2012-10-14 | Disposition: A | Discharge status: Home / Self Care | Payer: Medicare Other | Source: Ambulatory Visit | Attending: Family Medicine | Admitting: Family Medicine

## 2012-10-14 DIAGNOSIS — M545 Low back pain, unspecified: Secondary | ICD-10-CM

## 2012-10-22 ENCOUNTER — Telehealth (INDEPENDENT_AMBULATORY_CARE_PROVIDER_SITE_OTHER): Payer: Medicare Other | Admitting: Family Medicine

## 2012-10-22 DIAGNOSIS — D649 Anemia, unspecified: Secondary | ICD-10-CM

## 2012-10-22 NOTE — Addendum Note (Signed)
Addended by: Jennette Bill on: 10/22/2012 12:49 PM   Modules accepted: Orders

## 2012-10-22 NOTE — Telephone Encounter (Signed)
Called patient to let her know of her recent results. Let her know that she is anemic and that we worry in those cases that she might be loosing blood in her colon and stool. Told her that I called [facility] to get records of her latest colonoscopy. In the meantime, I recommended that she come to the clinic to be instructed on how to collect stool cards.  I told her that she will probably end up needing to go to GI to be further evaluated.  Also, let her know of results of her lumbar xray which are significant for arthritis changes.  Patient expressed understanding and stated that she would make appointment for a nurse visit to go over the stool card collection method with a nurse.   Marena Chancy, PGY-3 Family Medicine Resident

## 2012-11-04 ENCOUNTER — Telehealth: Payer: Self-pay | Admitting: Family Medicine

## 2012-11-04 NOTE — Telephone Encounter (Signed)
Called [facility] and last colonoscopy done was on May 19th 2000 and was normal. I called patient to let her know about this and she told me that she is sure that she had a colonoscopy done in the last 2-3 years. I asked her if it was done at [facility] and she wasn't sure. She said that she would go to the office where she had it done and would get a copy of the last colonoscopy results.   Marena Chancy, PGY-3 Family Medicine Resident

## 2012-11-06 ENCOUNTER — Telehealth: Payer: Self-pay | Admitting: Family Medicine

## 2012-11-06 NOTE — Telephone Encounter (Signed)
Called patient and left a message on her phone recommending that she see her gastroenterologist. I received the copy of her latest colonoscopy done by Dr. Loreta Ave in January 2011 which showed internal hemorrhoids and diverticulosis.  Since patient has been having abdominal discomfort, diarrhea and has been anemic on recent CBC, I recommended that she make an appointment to see Dr. Loreta Ave. If patient needs referral, she can call the clinic and I will place the referral for GI.   Marena Chancy, PGY-3 Family Medicine Resident

## 2012-11-11 ENCOUNTER — Encounter: Payer: Self-pay | Admitting: Family Medicine

## 2012-11-11 ENCOUNTER — Ambulatory Visit (INDEPENDENT_AMBULATORY_CARE_PROVIDER_SITE_OTHER): Payer: Medicare Other | Admitting: Family Medicine

## 2012-11-11 VITALS — BP 119/75 | HR 89 | Ht 62.0 in | Wt 190.7 lb

## 2012-11-11 DIAGNOSIS — R197 Diarrhea, unspecified: Secondary | ICD-10-CM

## 2012-11-11 DIAGNOSIS — D509 Iron deficiency anemia, unspecified: Secondary | ICD-10-CM

## 2012-11-11 DIAGNOSIS — K648 Other hemorrhoids: Secondary | ICD-10-CM

## 2012-11-11 NOTE — Patient Instructions (Addendum)
I will contact you after we get the results from the stool samples and we'll decide then whether you go see Dr. Loreta Ave.   Follow up with me in 6 months.

## 2012-11-12 LAB — POC HEMOCCULT BLD/STL (HOME/3-CARD/SCREEN): Card #2 Fecal Occult Blod, POC: POSITIVE

## 2012-11-12 NOTE — Progress Notes (Signed)
Patient ID: [name] [name]    DOB: 16-Oct-1940, 72 y.o.   MRN: [id number] --- Subjective:  [name] [name] is a 72 y.o.female who presents for follow up on diarrhea and abdominal pain.  - she states that diarrhea resolved after taking pepto-bysmol. She has one soft stool daily. No gross blood in stool. She completed the stool cards that were given to her and brings them in the office today. She denies any abdominal pain, nausea or vomiting. No pain with bowel movement.  - anemia: she has started taking ferrous sulfate twice daily and states that she feels better since she started it. No dizziness, no light headedness. No chest pain.    ROS: see HPI Past Medical History: reviewed and updated medications and allergies. Social History: Tobacco: former smoker: 33 pack year  Objective: Filed Vitals:   11/11/12 1044  BP: 119/75  Pulse: 89    Physical Examination:   General appearance - alert, well appearing, and in no distress Chest - clear to auscultation, no wheezes, rales or rhonchi, symmetric air entry Heart - normal rate, regular rhythm, normal S1, S2, 3/6 systolic best heard at right sternal border Abdomen - soft, nontender, nondistended, no masses or organomegaly

## 2012-11-12 NOTE — Addendum Note (Signed)
Addended by: [place], Erby Sanderson on: 11/12/2012 10:14 AM   Modules accepted: Orders

## 2012-11-13 NOTE — Assessment & Plan Note (Addendum)
Patient is very mildly anemic and has a history of anemia, but with reported history of rectal bleeding in the last few months, will closely monitor. Repeat CBC in 3 months. Monitor for new episodes of bleeding.

## 2012-11-14 NOTE — Assessment & Plan Note (Signed)
Home fecal occults positive. Patient with colonoscopy in 2011 showing diverticulosis and hemorrhoids. Unlikely that presence of blood in stool from colon cancer given 3 years since colonoscopy without  findings other diverticulosis and hemorrhoids.  Will closely monitor. If has worsening rectal bleeding, will refer back to her GI doctor.

## 2012-11-14 NOTE — Assessment & Plan Note (Signed)
resolved 

## 2012-11-16 ENCOUNTER — Telehealth: Payer: Self-pay | Admitting: Family Medicine

## 2012-11-16 NOTE — Telephone Encounter (Signed)
Called patient to discuss results of the positive fecal occult samples which were positive. Since her last colonoscopy was 3 years ago and showed diverticulosis and hemorrhoids and no evidence of polyps, this is unlikely to be malignancy and more so hemorrhoids or diverticular disease.  Will follow up in 3 months. Recheck CBC. Will hold on referral with GI for now.  Discussed and precepted plan with Dr. Gwendolyn Grant.   Patient expressed understanding and agreed to plan.   Marena Chancy, PGY-3 Family Medicine Resident

## 2012-11-20 ENCOUNTER — Other Ambulatory Visit: Payer: Self-pay | Admitting: Family Medicine

## 2012-12-23 ENCOUNTER — Other Ambulatory Visit: Payer: Self-pay | Admitting: Family Medicine

## 2013-01-20 ENCOUNTER — Ambulatory Visit (INDEPENDENT_AMBULATORY_CARE_PROVIDER_SITE_OTHER): Payer: Medicare Other | Admitting: Family Medicine

## 2013-01-20 ENCOUNTER — Ambulatory Visit: Payer: Medicare Other

## 2013-01-20 VITALS — BP 118/72 | HR 85 | Temp 98.2°F | Resp 18 | Wt 189.0 lb

## 2013-01-20 DIAGNOSIS — R079 Chest pain, unspecified: Secondary | ICD-10-CM

## 2013-01-20 DIAGNOSIS — M940 Chondrocostal junction syndrome [Tietze]: Secondary | ICD-10-CM

## 2013-01-20 DIAGNOSIS — S20219A Contusion of unspecified front wall of thorax, initial encounter: Secondary | ICD-10-CM

## 2013-01-20 MED ORDER — MELOXICAM 7.5 MG PO TABS: 7.5000 mg | ORAL_TABLET | Freq: Every day | ORAL | Status: DC

## 2013-01-20 NOTE — Patient Instructions (Addendum)
Chest Wall Pain °Chest wall pain is pain in or around the bones and muscles of your chest. It may take up to 6 weeks to get better. It may take longer if you must stay physically active in your work and activities.  °CAUSES  °Chest wall pain may happen on its own. However, it may be caused by: °· A viral illness like the flu. °· Injury. °· Coughing. °· Exercise. °· Arthritis. °· Fibromyalgia. °· Shingles. °HOME CARE INSTRUCTIONS  °· Avoid overtiring physical activity. Try not to strain or perform activities that cause pain. This includes any activities using your chest or your abdominal and side muscles, especially if heavy weights are used. °· Put ice on the sore area. °· Put ice in a plastic bag. °· Place a towel between your skin and the bag. °· Leave the ice on for 15-20 minutes per hour while awake for the first 2 days. °· Only take over-the-counter or prescription medicines for pain, discomfort, or fever as directed by your caregiver. °SEEK IMMEDIATE MEDICAL CARE IF:  °· Your pain increases, or you are very uncomfortable. °· You have a fever. °· Your chest pain becomes worse. °· You have new, unexplained symptoms. °· You have nausea or vomiting. °· You feel sweaty or lightheaded. °· You have a cough with phlegm (sputum), or you cough up blood. °MAKE SURE YOU:  °· Understand these instructions. °· Will watch your condition. °· Will get help right away if you are not doing well or get worse. °Document Released: 02/11/2005 Document Revised: 05/06/2011 Document Reviewed: 10/08/2010 °ExitCare® Patient Information ©2014 [employer]. ° °Costochondritis °Costochondritis (Tietze syndrome), or costochondral separation, is a swelling and irritation (inflammation) of the tissue (cartilage) that connects your ribs with your breastbone (sternum). It may occur on its own (spontaneously), through damage caused by an accident (trauma), or simply from coughing or minor exercise. It may take up to 6 weeks to get better and  longer if you are unable to be conservative in your activities. °HOME CARE INSTRUCTIONS  °· Avoid exhausting physical activity. Try not to strain your ribs during normal activity. This would include any activities using chest, belly (abdominal), and side muscles, especially if heavy weights are used. °· Use ice for 15-20 minutes per hour while awake for the first 2 days. Place the ice in a plastic bag, and place a towel between the bag of ice and your skin. °· Only take over-the-counter or prescription medicines for pain, discomfort, or fever as directed by your caregiver. °SEEK IMMEDIATE MEDICAL CARE IF:  °· Your pain increases or you are very uncomfortable. °· You have a fever. °· You develop difficulty with your breathing. °· You cough up blood. °· You develop worse chest pains, shortness of breath, sweating, or vomiting. °· You develop new, unexplained problems (symptoms). °MAKE SURE YOU:  °· Understand these instructions. °· Will watch your condition. °· Will get help right away if you are not doing well or get worse. °Document Released: 11/21/2004 Document Revised: 05/06/2011 Document Reviewed: 09/15/2012 °ExitCare® Patient Information ©2014 [employer]. ° °

## 2013-01-20 NOTE — Progress Notes (Signed)
Chief Complaint:  Chief Complaint  Patient presents with  . fell a mth ago now having shoulder pain x2 weeks    HPI: [name] [name] is a 72 y.o. female who is here for  4/10 dull aching pain worse with lifting or ROM Chest pain s/p fall about 1 month ago, bilaterally , she deneis nausea, vomting, abd pain She had mammogram in May and was normal, no recent breast changes She started having pain 2 weeks Denies palpitations, wheezing, sob Has tried nothing  Past Medical History  Diagnosis Date  . Hypertension   . Anemia     Fe def  . Postmenopausal status   . Arthritis    Past Surgical History  Procedure Laterality Date  . Knee arthroplasty  04/19/10    Dr Luiz Blare  . Cesarean section      x2  . Internal hemorrhoids    . Sigmoid diverticulosis    . Joint replacement  2011    left knee replacement  . Tubal ligation  1978   History   Social History  . Marital Status: Married    Spouse Name: N/A    Number of Children: N/A  . Years of Education: N/A   Social History Main Topics  . Smoking status: Former Smoker -- 1.00 packs/day for 33 years    Types: Cigarettes  . Smokeless tobacco: Never Used  . Alcohol Use: No  . Drug Use: No  . Sexual Activity: Yes   Other Topics Concern  . None   Social History Narrative   Married, lives with husband and grandson (pt adopted him when he was 47 days old, age 71 now).     Works at [employer]     5 children (youngest adopted- he doesn't know)   smokes 1-2 pack per day until 2009   no EtOH   Drug use-no         Family History  Problem Relation Age of Onset  . Hypertension Father   . Stroke Father   . Prostate cancer Brother    No Known Allergies Prior to Admission medications   Medication Sig Start Date End Date Taking? Authorizing Provider  aspirin 81 MG tablet Take 81 mg by mouth daily.     Yes Historical Provider, MD  cholecalciferol (VITAMIN D) 1000 UNITS tablet Take 1,000 Units by mouth  daily.   Yes Historical Provider, MD  ferrous sulfate 325 (65 FE) MG tablet Take 1 tablet (325 mg total) by mouth 2 (two) times daily. 11/20/12  Yes Amber Nydia Bouton, MD  losartan-hydrochlorothiazide (HYZAAR) 50-12.5 MG per tablet Take 1 tablet by mouth daily. 11/07/10 01/20/13 Yes Rodolph Bong, MD  docusate sodium (COLACE) 100 MG capsule Take 1 capsule (100 mg total) by mouth 2 (two) times daily. for constipation (while taking iron) 03/26/11   Kristen Cardinal, MD     ROS: The patient denies fevers, chills, night sweats, unintentional weight loss, chest pain, palpitations, wheezing, dyspnea on exertion, nausea, vomiting, abdominal pain, dysuria, hematuria, melena, numbness, weakness, or tingling.   All other systems have been reviewed and were otherwise negative with the exception of those mentioned in the HPI and as above.    PHYSICAL EXAM: Filed Vitals:   01/20/13 1223  BP: 118/72  Pulse: 85  Temp: 98.2 F (36.8 C)  Resp: 18   Filed Vitals:   01/20/13 1223  Weight: 189 lb (85.73 kg)   Body mass index is 34.56  kg/(m^2).  General: Alert, no acute distress HEENT:  Normocephalic, atraumatic, oropharynx patent. EOMI, PERRLA Cardiovascular:  Regular rate and rhythm, no rubs murmurs or gallops.  No Carotid bruits, radial pulse intact. No pedal edema.  Respiratory: Clear to auscultation bilaterally.  No wheezes, rales, or rhonchi.  No cyanosis, no use of accessory musculature GI: No organomegaly, abdomen is soft and non-tender, positive bowel sounds.  No masses. Skin: No rashes. Neurologic: Facial musculature symmetric. Psychiatric: Patient is appropriate throughout our interaction. Lymphatic: No cervical lymphadenopathy Musculoskeletal: Gait intact. + tender on bialteral chest with palpation Breast ecam nl ( pt wanted to make sure her breast exam was normal and there were no masses causing her to have pain)   LABS: Results for orders placed in visit on 11/11/12  POCT HEMOGLOBIN       Result Value Range   Hemoglobin 11.9 (*) 12.2 - 16.2 g/dL     EKG/XRAY:   Primary read interpreted by Dr. Conley Rolls at [facility]. No acute cardiopulmonary process   ASSESSMENT/PLAN: Encounter Diagnoses  Name Primary?  . Chest pain, unspecified Yes  . Chest wall contusion, unspecified laterality, initial encounter    No acute cardiopulm process on cxr, Rx mobic for chest wall pain/contusion due t fall and also cosochondritits F/u prn   Gross sideeffects, risk and benefits, and alternatives of medications d/w patient. Patient is aware that all medications have potential sideeffects and we are unable to predict every sideeffect or drug-drug interaction that may occur.  Hamilton Capri PHUONG, DO 01/20/2013 2:34 PM

## 2013-02-16 ENCOUNTER — Encounter: Payer: Self-pay | Admitting: Family Medicine

## 2013-02-16 ENCOUNTER — Ambulatory Visit (INDEPENDENT_AMBULATORY_CARE_PROVIDER_SITE_OTHER): Payer: Medicare Other | Admitting: Family Medicine

## 2013-02-16 VITALS — BP 148/83 | HR 84 | Temp 98.2°F | Ht 62.0 in | Wt 193.0 lb

## 2013-02-16 DIAGNOSIS — D649 Anemia, unspecified: Secondary | ICD-10-CM

## 2013-02-16 DIAGNOSIS — J069 Acute upper respiratory infection, unspecified: Secondary | ICD-10-CM

## 2013-02-16 DIAGNOSIS — D509 Iron deficiency anemia, unspecified: Secondary | ICD-10-CM

## 2013-02-16 LAB — CBC
MCH: 25.1 pg — ABNORMAL LOW (ref 26.0–34.0)
Platelets: 283 10*3/uL (ref 150–400)
RBC: 4.7 MIL/uL (ref 3.87–5.11)
RDW: 15.6 % — ABNORMAL HIGH (ref 11.5–15.5)
WBC: 10.1 10*3/uL (ref 4.0–10.5)

## 2013-02-16 LAB — IRON AND TIBC
TIBC: 252 ug/dL (ref 250–470)
UIBC: 212 ug/dL (ref 125–400)

## 2013-02-16 LAB — FERRITIN: Ferritin: 90 ng/mL (ref 10–291)

## 2013-02-16 MED ORDER — IPRATROPIUM BROMIDE 0.06 % NA SOLN: 2.0000 | Freq: Four times a day (QID) | NASAL | Status: DC

## 2013-02-16 MED ORDER — FERROUS SULFATE 325 (65 FE) MG PO TABS: 325.0000 mg | ORAL_TABLET | Freq: Two times a day (BID) | ORAL | Status: DC

## 2013-02-16 NOTE — Patient Instructions (Signed)
For the cold symptoms, use the Atrovent spray as needed. If you get any worse please return to care. Further blood pressure, asked her pharmacist has been need a refill request. I will be in touch with you with the results of the lab work. Followup with me in 3 months.

## 2013-02-21 DIAGNOSIS — J069 Acute upper respiratory infection, unspecified: Secondary | ICD-10-CM | POA: Insufficient documentation

## 2013-02-21 NOTE — Assessment & Plan Note (Signed)
Cold symptoms without evidence of shortness of breath or exam findings suggesting need for CXR.  Will treat symptomatically with atrovent nasal spray.  Red flags for return reviewed.

## 2013-02-21 NOTE — Progress Notes (Signed)
Patient ID: COURTNE LIGHTY    DOB: Nov 04, 1940, 72 y.o.   MRN: [id number] --- Subjective:  Markella is a 72 y.o.female who presents with cold symptoms.  - started 3 days ago. She has been taking niquil which did not help. SHe has been taking alkasltzer which helps. She reports a cough productive of clear sputum. No shortness of breath. No fever, no chills. Congestion in nose is present. No sore throat. She had some muscle soreness but this has resolved. Overall symptoms are improving.   - f/u on rectal bleeding: has not had anymore rectal bleeding. No diarrhea. No constipation. Taking iron tablets. No dizziness or lightheadedness.   ROS: see HPI Past Medical History: reviewed and updated medications and allergies. Social History: Tobacco: former smoker  Objective: Filed Vitals:   02/16/13 1056  BP: 148/83  Pulse: 84  Temp: 98.2 F (36.8 C)    Physical Examination:   General appearance - alert, well appearing, and in no distress Ears - bilateral TM's and external ear canals normal Nose - erythematous and congested nasal turbinates bilaterally Mouth - mucous membranes moist, erythematous oropharynx, no tonsillar exudates Neck - supple, no significant adenopathy Chest - clear to auscultation, no wheezes, rales or rhonchi, symmetric air entry Heart - normal rate, regular rhythm, normal S1, S2, 3/6 systolic murmur heard throughout pericardium

## 2013-02-21 NOTE — Assessment & Plan Note (Signed)
Long standing anemia. Will obtain iron studies.

## 2013-07-06 ENCOUNTER — Other Ambulatory Visit (HOSPITAL_COMMUNITY): Payer: Self-pay | Admitting: Pediatrics

## 2013-07-06 DIAGNOSIS — Z1231 Encounter for screening mammogram for malignant neoplasm of breast: Secondary | ICD-10-CM

## 2013-07-22 ENCOUNTER — Other Ambulatory Visit: Payer: Self-pay | Admitting: Family Medicine

## 2013-07-26 ENCOUNTER — Encounter: Payer: Self-pay | Admitting: Family Medicine

## 2013-07-26 ENCOUNTER — Ambulatory Visit (INDEPENDENT_AMBULATORY_CARE_PROVIDER_SITE_OTHER): Payer: Medicare HMO | Admitting: Family Medicine

## 2013-07-26 VITALS — BP 124/79 | HR 72 | Temp 98.2°F | Ht 62.0 in | Wt 182.6 lb

## 2013-07-26 DIAGNOSIS — I10 Essential (primary) hypertension: Secondary | ICD-10-CM

## 2013-07-26 DIAGNOSIS — M25561 Pain in right knee: Secondary | ICD-10-CM

## 2013-07-26 DIAGNOSIS — M1710 Unilateral primary osteoarthritis, unspecified knee: Secondary | ICD-10-CM

## 2013-07-26 DIAGNOSIS — IMO0002 Reserved for concepts with insufficient information to code with codable children: Secondary | ICD-10-CM

## 2013-07-26 DIAGNOSIS — M25569 Pain in unspecified knee: Secondary | ICD-10-CM

## 2013-07-26 DIAGNOSIS — M179 Osteoarthritis of knee, unspecified: Secondary | ICD-10-CM

## 2013-07-26 DIAGNOSIS — R21 Rash and other nonspecific skin eruption: Secondary | ICD-10-CM

## 2013-07-26 MED ORDER — FLUTICASONE PROPIONATE 50 MCG/ACT NA SUSP: 2.0000 | Freq: Every day | NASAL | Status: DC

## 2013-07-26 MED ORDER — LOSARTAN POTASSIUM-HCTZ 50-12.5 MG PO TABS: 1.0000 | ORAL_TABLET | Freq: Every day | ORAL | Status: DC

## 2013-07-26 MED ORDER — CLOTRIMAZOLE 1 % EX CREA: 1.0000 "application " | TOPICAL_CREAM | Freq: Two times a day (BID) | CUTANEOUS | Status: DC

## 2013-07-26 NOTE — Patient Instructions (Signed)
If the area on your skin doesn't get better, let me know.   Please make an appointment for the PAP smear. Your labs are due in August.

## 2013-07-27 DIAGNOSIS — R21 Rash and other nonspecific skin eruption: Secondary | ICD-10-CM | POA: Insufficient documentation

## 2013-07-27 NOTE — Assessment & Plan Note (Signed)
Impairing patient's daily activities.  Will refer her back to her orthopedist at [facility] to discuss her options.

## 2013-07-27 NOTE — Progress Notes (Signed)
Patient ID: TREBA BECKIUS    DOB: [birth year], 73 y.o.   MRN: [id number] --- Subjective:  [name] [name] is a 73 y.o.female with h/o HTN, knee osteoarthritis who presents with the following concerns:  # bilateral temporal itching and irritation: started about 9 months ago. Initially, had acute pain of bilateral temples, which subsided. Since then, she has had irritation in that area. No headache, no change in vision. Some skin changes. 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 Located in the frontal aspect of the knee. Worst with walking and bearing weight. Better with rest. No locking or popping or giving out. She has had the left knee replaced and would like to know about an evaluation for her right knee.  # Hypertension: Medications: losartan/hctz 50/12.5mg  daily Number of doses missed in 1 week: none BP at home: doesn't check Chest pain: none Lower extremity swelling: none Shortness of breath: none Headache: none Change in vision: none   ROS: see HPI Past Medical History: reviewed and updated medications and allergies. Social History: Tobacco: former smoker  Objective: Filed Vitals:   07/26/13 1016  BP: 124/79  Pulse: 72  Temp: 98.2 F (36.8 C)    Physical Examination:   General appearance - alert, well appearing, and in no distress Mouth - mucous membranes moist, pharynx normal without lesions Neck - supple, no significant adenopathy Chest - clear to auscultation, no wheezes, rales or rhonchi, symmetric air entry Heart - normal rate, regular rhythm, normal S1, S2, 3/6 holosystolic murmur heard throughout pericardium Extremities - no pedal edema Skin - temples: bilateral scaly hyperpigmentated patch around 1cm on each temple with some flaking around it Knee - valgus deformity of right knee, tenderness along the anterior knee, slightly  reduced flexion, normal extension

## 2013-07-27 NOTE — Assessment & Plan Note (Signed)
Unclear etiology of bilateral skin changes of temples. No evidence of headache or change in vision, making temporal artiritis very unlikely.  Given scaliness to the area, will empirically treat for tinea with topical clotrimazole. If this doesn't improve, consider changing to steroid cream and possible biopsy.

## 2013-07-27 NOTE — Assessment & Plan Note (Signed)
Well controled.  Yearly labs due in August Refilled losartan/hctz 50/12.5mg  daily

## 2013-08-02 ENCOUNTER — Other Ambulatory Visit (HOSPITAL_COMMUNITY): Payer: Self-pay | Admitting: Pediatrics

## 2013-08-02 ENCOUNTER — Ambulatory Visit (HOSPITAL_COMMUNITY)
Admission: RE | Admit: 2013-08-02 | Discharge: 2013-08-02 | Disposition: A | Discharge status: Home / Self Care | Payer: Medicare HMO | Source: Ambulatory Visit | Attending: Family Medicine | Admitting: Family Medicine

## 2013-08-02 DIAGNOSIS — Z1231 Encounter for screening mammogram for malignant neoplasm of breast: Secondary | ICD-10-CM

## 2013-08-04 ENCOUNTER — Encounter: Payer: Self-pay | Admitting: Family Medicine

## 2013-08-04 ENCOUNTER — Telehealth: Payer: Self-pay | Admitting: *Deleted

## 2013-08-04 NOTE — Telephone Encounter (Signed)
LM for patient to call back.  She needs to call [employer] and have PCP changed on her card.  Call us when this has been done and we will take care of referral. [facility]

## 2013-08-05 NOTE — Telephone Encounter (Signed)
Pt is aware of insurance problem and will call them to have this fixed.  Informed to let us know when she gets her new card and bring it by to be scanned so we can process referral. Jazmin Hartsell,CMA

## 2013-11-16 ENCOUNTER — Other Ambulatory Visit (HOSPITAL_COMMUNITY)
Admission: RE | Admit: 2013-11-16 | Discharge: 2013-11-16 | Disposition: A | Discharge status: Home / Self Care | Payer: Medicare HMO | Source: Ambulatory Visit | Attending: Family Medicine | Admitting: Family Medicine

## 2013-11-16 ENCOUNTER — Encounter: Payer: Self-pay | Admitting: Family Medicine

## 2013-11-16 ENCOUNTER — Ambulatory Visit (INDEPENDENT_AMBULATORY_CARE_PROVIDER_SITE_OTHER): Payer: Commercial Managed Care - HMO | Admitting: Family Medicine

## 2013-11-16 VITALS — BP 139/81 | HR 74 | Temp 98.0°F | Wt 187.0 lb

## 2013-11-16 DIAGNOSIS — D509 Iron deficiency anemia, unspecified: Secondary | ICD-10-CM

## 2013-11-16 DIAGNOSIS — M1710 Unilateral primary osteoarthritis, unspecified knee: Secondary | ICD-10-CM

## 2013-11-16 DIAGNOSIS — Z124 Encounter for screening for malignant neoplasm of cervix: Secondary | ICD-10-CM

## 2013-11-16 DIAGNOSIS — E663 Overweight: Secondary | ICD-10-CM

## 2013-11-16 DIAGNOSIS — Z01419 Encounter for gynecological examination (general) (routine) without abnormal findings: Secondary | ICD-10-CM

## 2013-11-16 DIAGNOSIS — Z23 Encounter for immunization: Secondary | ICD-10-CM

## 2013-11-16 DIAGNOSIS — M1711 Unilateral primary osteoarthritis, right knee: Secondary | ICD-10-CM

## 2013-11-16 DIAGNOSIS — E785 Hyperlipidemia, unspecified: Secondary | ICD-10-CM

## 2013-11-16 DIAGNOSIS — Z0000 Encounter for general adult medical examination without abnormal findings: Secondary | ICD-10-CM

## 2013-11-16 LAB — CBC
HEMATOCRIT: 37.3 % (ref 36.0–46.0)
HEMOGLOBIN: 12 g/dL (ref 12.0–15.0)
MCH: 24.5 pg — AB (ref 26.0–34.0)
MCHC: 32.2 g/dL (ref 30.0–36.0)
MCV: 76.3 fL — AB (ref 78.0–100.0)
Platelets: 266 10*3/uL (ref 150–400)
RBC: 4.89 MIL/uL (ref 3.87–5.11)
RDW: 16.1 % — AB (ref 11.5–15.5)
WBC: 9.4 10*3/uL (ref 4.0–10.5)

## 2013-11-16 LAB — LDL CHOLESTEROL, DIRECT: LDL DIRECT: 100 mg/dL — AB

## 2013-11-16 MED ORDER — ZOSTER VACCINE LIVE 19400 UNT/0.65ML ~~LOC~~ SOLR: 0.6500 mL | Freq: Once | SUBCUTANEOUS | Status: DC

## 2013-11-16 MED ORDER — FERROUS SULFATE 325 (65 FE) MG PO TABS: ORAL_TABLET | ORAL | Status: DC

## 2013-11-16 NOTE — Assessment & Plan Note (Addendum)
Previously told she would need a second knee replacement by ortho, difficulty walking and visible valgus deformity on exam  - Refer back to [facility] for possible replacement

## 2013-11-16 NOTE — Assessment & Plan Note (Signed)
Doing well. - script for zostavax given - flu shot admin today - pap performed today on patient's request after discussion of screening recommendations - other health maintenance UTD

## 2013-11-16 NOTE — Patient Instructions (Signed)
Thank you for coming in today.   Please take the [facility]ostavax prescription to a [facility] to get the shingles shot and let us know when you have done this so we can keep track.  We will call you in the next week with the results for your pap smear.  Thanks

## 2013-11-16 NOTE — Progress Notes (Signed)
   Subjective:    Patient ID: [name] [name], female    DOB: 1940/10/21, 73 y.o.   MRN: [id number]  Gynecologic Exam Associated symptoms include constipation.   Pt presents for well woman exam. She reports that her right knee is her only issue right now. It has been bothering her for many years but is getting progressively worse. When she had her left knee replaced they told her the right one would need to be done in the next few years. She would like to go back to see the surgeon about getting it done now.   Review of Systems  Gastrointestinal: Positive for constipation.  All other systems reviewed and are negative.      Objective:   Physical Exam  Nursing note and vitals reviewed. Constitutional: She is oriented to person, place, and time. She appears well-developed and well-nourished. No distress.  HENT:  Head: Normocephalic and atraumatic.  Eyes: Conjunctivae are normal. Right eye exhibits no discharge. Left eye exhibits no discharge. No scleral icterus.  Neck: Normal range of motion. Neck supple. No thyromegaly present.  Cardiovascular: Normal rate and regular rhythm.   Murmur heard. III/VI SEM  Pulmonary/Chest: Effort normal and breath sounds normal. No respiratory distress. She has no wheezes.  Abdominal: Soft. She exhibits no distension and no mass. There is no tenderness. There is no rebound and no guarding.  Genitourinary: Vagina normal and uterus normal. No labial fusion. There is no rash, tenderness, lesion or injury on the right labia. There is no rash, tenderness, lesion or injury on the left labia. Uterus is not deviated, not enlarged, not fixed and not tender. Cervix exhibits no motion tenderness, no discharge and no friability. Right adnexum displays no mass, no tenderness and no fullness. Left adnexum displays no mass, no tenderness and no fullness. No erythema, tenderness or bleeding around the vagina. No foreign body around the vagina. No signs of injury around the  vagina. No vaginal discharge found.  Musculoskeletal: Normal range of motion. She exhibits edema. She exhibits no tenderness.  Valgus deformity of right knee  Lymphadenopathy:    She has no cervical adenopathy.  Neurological: She is alert and oriented to person, place, and time.  Skin: Skin is warm and dry. No rash noted. She is not diaphoretic.  Psychiatric: She has a normal mood and affect. Her behavior is normal.          Assessment & Plan:

## 2013-11-18 ENCOUNTER — Telehealth: Payer: Self-pay | Admitting: *Deleted

## 2013-11-18 LAB — CYTOLOGY - PAP

## 2013-11-18 MED ORDER — ATORVASTATIN CALCIUM 40 MG PO TABS: 40.0000 mg | ORAL_TABLET | Freq: Every day | ORAL | Status: DC

## 2013-11-18 NOTE — Addendum Note (Signed)
Addended by: Abram Sander on: 11/18/2013 01:54 PM   Modules accepted: Orders

## 2013-11-18 NOTE — Telephone Encounter (Signed)
Pt informed. [name] [name] CMA 

## 2013-11-18 NOTE — Telephone Encounter (Signed)
Message copied by Pamelia Hoit on Thu Nov 18, 2013  3:59 PM ------      Message from: Abram Sander      Created: Thu Nov 18, 2013  1:53 PM       Please inform patient that her hemoglobin is now normal but she should continue taking her iron pills to prevent her from becoming anemic again. Her cholesterol is somewhat elevated and I would recommend she take a medication to lower it and decrease her risk of heart attack or stroke. I have sent this prescription for atorvastatin to her pharmacy. It can occasionally cause muscle cramps and she should let me know if she has any problems with it. ------

## 2013-11-18 NOTE — Telephone Encounter (Signed)
Message copied by Pamelia Hoit on Thu Nov 18, 2013  3:59 PM ------      Message from: Abram Sander      Created: Thu Nov 18, 2013  3:44 PM       Please inform patient that her pap smear was normal. She does not require any further pap smears but if she wishes to continue getting them I would recommend her next be done in 3 years. ------

## 2013-12-02 ENCOUNTER — Telehealth: Payer: Self-pay | Admitting: Family Medicine

## 2013-12-02 NOTE — Telephone Encounter (Signed)
[facility] (Per pt) [id number]

## 2013-12-03 NOTE — Telephone Encounter (Signed)
[employer] referral placed. Auth # [id number].

## 2013-12-03 NOTE — Telephone Encounter (Signed)
Needs [employer] referral, pt is scheduled with Ranee Gosselinonald Gioffre, dx code for rt knee  Is M25.561

## 2013-12-16 ENCOUNTER — Other Ambulatory Visit: Payer: Self-pay | Admitting: Surgical

## 2013-12-16 MED ORDER — DEXAMETHASONE SODIUM PHOSPHATE 10 MG/ML IJ SOLN: 10.0000 mg | Freq: Once | INTRAMUSCULAR | Status: DC

## 2013-12-16 MED ORDER — BUPIVACAINE LIPOSOME 1.3 % IJ SUSP: 20.0000 mL | Freq: Once | INTRAMUSCULAR | Status: DC

## 2013-12-20 ENCOUNTER — Other Ambulatory Visit: Payer: Self-pay | Admitting: *Deleted

## 2013-12-20 DIAGNOSIS — D509 Iron deficiency anemia, unspecified: Secondary | ICD-10-CM

## 2013-12-20 MED ORDER — FERROUS SULFATE 325 (65 FE) MG PO TABS: ORAL_TABLET | ORAL | Status: DC

## 2013-12-23 ENCOUNTER — Encounter (HOSPITAL_COMMUNITY): Payer: Self-pay | Admitting: Pharmacy Technician

## 2013-12-27 ENCOUNTER — Other Ambulatory Visit (HOSPITAL_COMMUNITY): Payer: Self-pay | Admitting: *Deleted

## 2013-12-27 NOTE — Progress Notes (Signed)
CHEST XRAY 01-20-13 EPIC

## 2013-12-27 NOTE — Patient Instructions (Addendum)
22 [name] [name]  12/28/2013   Your procedure is scheduled on:  Tuesday January 04, 2014        [facility] and follow signs to  Short Stay Center arrive at 10:30 AM.  Call this number if you have problems the morning of surgery [phone] or Presurgical Testing [phone].   Remember:  Do not eat foods After Midnight but you may take clear liquids till 6:30 am then nothing by mouth.  For Living Will and/or Health Care Power Attorney Forms: please provide copy for your medical record, may bring AM of surgery (forms should be already notarized-we do not provide this service).     Take these medicines the morning of surgery with A SIP OF WATER: NONE                               You may not have any metal on your body including hair pins and piercings  Do not wear jewelry, make-up, lotions, powders, or deodorant.  Do not shave body hair  48 hours(2 days) of CHG soap use.                Do not bring valuables to the hospital. [facility] IS NOT RESPONSIBLE FOR VALUABLES.  Contacts, dentures or bridgework may not be worn into surgery.  Leave suitcase in the car. After surgery it may be brought to your room.  For patients admitted to the hospital, checkout time is 11:00 AM the day of discharge.   Special Instructions: review fact sheets for MRSA information, Blood Transfusion fact sheet, Incentive Spirometry.  Remember: Type/Screen "Blue armsbands"- may not be removed once applied(would result in being retested AM of surgery, if removed). ________________________________________________________________________  [facility] - Preparing for Surgery Before surgery, you can play an important role.  Because skin is not sterile, your skin needs to be as free of germs as possible.  You can reduce the number of germs on your skin by washing with CHG (chlorahexidine gluconate) soap before surgery.  CHG is an antiseptic cleaner which kills germs and bonds with the  skin to continue killing germs even after washing. Please DO NOT use if you have an allergy to CHG or antibacterial soaps.  If your skin becomes reddened/irritated stop using the CHG and inform your nurse when you arrive at Short Stay. Do not shave (including legs and underarms) for at least 48 hours prior to the first CHG shower.  You may shave your face/neck. Please follow these instructions carefully:  1.  Shower with CHG Soap the night before surgery and the  morning of Surgery.  2.  If you choose to wash your hair, wash your hair first as usual with your  normal  shampoo.  3.  After you shampoo, rinse your hair and body thoroughly to remove the  shampoo.                           4.  Use CHG as you would any other liquid soap.  You can apply chg directly  to the skin and wash                       Gently with a scrungie or clean washcloth.  5.  Apply the CHG Soap to your body ONLY FROM THE NECK DOWN.   Do not use  on face/ open                           Wound or open sores. Avoid contact with eyes, ears mouth and genitals (private parts).                       Wash face,  Genitals (private parts) with your normal soap.             6.  Wash thoroughly, paying special attention to the area where your surgery  will be performed.  7.  Thoroughly rinse your body with warm water from the neck down.  8.  DO NOT shower/wash with your normal soap after using and rinsing off  the CHG Soap.                9.  Pat yourself dry with a clean towel.            10.  Wear clean pajamas.            11.  Place clean sheets on your bed the night of your first shower and do not  sleep with pets. Day of Surgery : Do not apply any lotions/deodorants the morning of surgery.  Please wear clean clothes to the hospital/surgery center.  FAILURE TO FOLLOW THESE INSTRUCTIONS MAY RESULT IN THE CANCELLATION OF YOUR SURGERY PATIENT SIGNATURE_________________________________  NURSE  SIGNATURE__________________________________  ________________________________________________________________________    CLEAR LIQUID DIET   Foods Allowed                                                                     Foods Excluded  Coffee and tea, regular and decaf                             liquids that you cannot  Plain Jell-O in any flavor                                             see through such as: Fruit ices (not with fruit pulp)                                     milk, soups, orange juice  Iced Popsicles                                    All solid food Carbonated beverages, regular and diet                                    Cranberry, grape and apple juices Sports drinks like Gatorade Lightly seasoned clear broth or consume(fat free) Sugar, honey syrup  Sample Menu Breakfast  Lunch                                     Supper Cranberry juice                    Beef broth                            Chicken broth Jell-O                                     Grape juice                           Apple juice Coffee or tea                        Jell-O                                      Popsicle                                                Coffee or tea                        Coffee or tea  _____________________________________________________________________    Incentive Spirometer  An incentive spirometer is a tool that can help keep your lungs clear and active. This tool measures how well you are filling your lungs with each breath. Taking long deep breaths may help reverse or decrease the chance of developing breathing (pulmonary) problems (especially infection) following:  A long period of time when you are unable to move or be active. BEFORE THE PROCEDURE   If the spirometer includes an indicator to show your best effort, your nurse or respiratory therapist will set it to a desired goal.  If possible, sit up straight or lean  slightly forward. Try not to slouch.  Hold the incentive spirometer in an upright position. INSTRUCTIONS FOR USE   Sit on the edge of your bed if possible, or sit up as far as you can in bed or on a chair.  Hold the incentive spirometer in an upright position.  Breathe out normally.  Place the mouthpiece in your mouth and seal your lips tightly around it.  Breathe in slowly and as deeply as possible, raising the piston or the ball toward the top of the column.  Hold your breath for 3-5 seconds or for as long as possible. Allow the piston or ball to fall to the bottom of the column.  Remove the mouthpiece from your mouth and breathe out normally.  Rest for a few seconds and repeat Steps 1 through 7 at least 10 times every 1-2 hours when you are awake. Take your time and take a few normal breaths between deep breaths.  The spirometer may include an indicator to show your best effort. Use the indicator as a goal to work toward during each repetition.  After each set of 10 deep breaths,  practice coughing to be sure your lungs are clear. If you have an incision (the cut made at the time of surgery), support your incision when coughing by placing a pillow or rolled up towels firmly against it. Once you are able to get out of bed, walk around indoors and cough well. You may stop using the incentive spirometer when instructed by your caregiver.  RISKS AND COMPLICATIONS  Take your time so you do not get dizzy or light-headed.  If you are in pain, you may need to take or ask for pain medication before doing incentive spirometry. It is harder to take a deep breath if you are having pain. AFTER USE  Rest and breathe slowly and easily.  It can be helpful to keep track of a log of your progress. Your caregiver can provide you with a simple table to help with this. If you are using the spirometer at home, follow these instructions: SEEK MEDICAL CARE IF:   You are having difficultly using the  spirometer.  You have trouble using the spirometer as often as instructed.  Your pain medication is not giving enough relief while using the spirometer.  You develop fever of 100.5 F (38.1 C) or higher. SEEK IMMEDIATE MEDICAL CARE IF:   You cough up bloody sputum that had not been present before.  You develop fever of 102 F (38.9 C) or greater.  You develop worsening pain at or near the incision site. MAKE SURE YOU:   Understand these instructions.  Will watch your condition.  Will get help right away if you are not doing well or get worse. Document Released: 06/24/2006 Document Revised: 05/06/2011 Document Reviewed: 08/25/2006 Exit[employer] Patient Information 2014 [employer], [place].   ________________________________________________________________________  WHAT IS A BLOOD TRANSFUSION? Blood Transfusion Information  A transfusion is the replacement of blood or some of its parts. Blood is made up of multiple cells which provide different functions.  Red blood cells carry oxygen and are used for blood loss replacement.  White blood cells fight against infection.  Platelets control bleeding.  Plasma helps clot blood.  Other blood products are available for specialized needs, such as hemophilia or other clotting disorders. BEFORE THE TRANSFUSION  Who gives blood for transfusions?   Healthy volunteers who are fully evaluated to make sure their blood is safe. This is blood bank blood. Transfusion therapy is the safest it has ever been in the practice of medicine. Before blood is taken from a donor, a complete history is taken to make sure that person has no history of diseases nor engages in risky social behavior (examples are intravenous drug use or sexual activity with multiple partners). The donor's travel history is screened to minimize risk of transmitting infections, such as malaria. The donated blood is tested for signs of infectious diseases, such as HIV and hepatitis.  The blood is then tested to be sure it is compatible with you in order to minimize the chance of a transfusion reaction. If you or a relative donates blood, this is often done in anticipation of surgery and is not appropriate for emergency situations. It takes many days to process the donated blood. RISKS AND COMPLICATIONS Although transfusion therapy is very safe and saves many lives, the main dangers of transfusion include:   Getting an infectious disease.  Developing a transfusion reaction. This is an allergic reaction to something in the blood you were given. Every precaution is taken to prevent this. The decision to have a blood transfusion has been  considered carefully by your caregiver before blood is given. Blood is not given unless the benefits outweigh the risks. AFTER THE TRANSFUSION  Right after receiving a blood transfusion, you will usually feel much better and more energetic. This is especially true if your red blood cells have gotten low (anemic). The transfusion raises the level of the red blood cells which carry oxygen, and this usually causes an energy increase.  The nurse administering the transfusion will monitor you carefully for complications. HOME CARE INSTRUCTIONS  No special instructions are needed after a transfusion. You may find your energy is better. Speak with your caregiver about any limitations on activity for underlying diseases you may have. SEEK MEDICAL CARE IF:   Your condition is not improving after your transfusion.  You develop redness or irritation at the intravenous (IV) site. SEEK IMMEDIATE MEDICAL CARE IF:  Any of the following symptoms occur over the next 12 hours:  Shaking chills.  You have a temperature by mouth above 102 F (38.9 C), not controlled by medicine.  Chest, back, or muscle pain.  People around you feel you are not acting correctly or are confused.  Shortness of breath or difficulty breathing.  Dizziness and fainting.  You  get a rash or develop hives.  You have a decrease in urine output.  Your urine turns a dark color or changes to pink, red, or brown. Any of the following symptoms occur over the next 10 days:  You have a temperature by mouth above 102 F (38.9 C), not controlled by medicine.  Shortness of breath.  Weakness after normal activity.  The white part of the eye turns yellow (jaundice).  You have a decrease in the amount of urine or are urinating less often.  Your urine turns a dark color or changes to pink, red, or brown. Document Released: 02/09/2000 Document Revised: 05/06/2011 Document Reviewed: 09/28/2007 [facility] Patient Information 2014 [place], [place].  _______________________________________________________________________

## 2013-12-28 ENCOUNTER — Encounter (HOSPITAL_COMMUNITY): Payer: Self-pay

## 2013-12-28 ENCOUNTER — Encounter (HOSPITAL_COMMUNITY)
Admission: RE | Admit: 2013-12-28 | Discharge: 2013-12-28 | Disposition: A | Discharge status: Home / Self Care | Payer: Medicare HMO | Source: Ambulatory Visit | Attending: Orthopedic Surgery | Admitting: Orthopedic Surgery

## 2013-12-28 DIAGNOSIS — Z01818 Encounter for other preprocedural examination: Secondary | ICD-10-CM | POA: Insufficient documentation

## 2013-12-28 DIAGNOSIS — Z22321 Carrier or suspected carrier of Methicillin susceptible Staphylococcus aureus: Secondary | ICD-10-CM | POA: Diagnosis not present

## 2013-12-28 HISTORY — DX: Gastro-esophageal reflux disease without esophagitis: K21.9

## 2013-12-28 LAB — CBC
HEMATOCRIT: 37.1 % (ref 36.0–46.0)
Hemoglobin: 11.4 g/dL — ABNORMAL LOW (ref 12.0–15.0)
MCH: 24.1 pg — ABNORMAL LOW (ref 26.0–34.0)
MCHC: 30.7 g/dL (ref 30.0–36.0)
MCV: 78.3 fL (ref 78.0–100.0)
PLATELETS: 279 10*3/uL (ref 150–400)
RBC: 4.74 MIL/uL (ref 3.87–5.11)
RDW: 15.3 % (ref 11.5–15.5)
WBC: 7 10*3/uL (ref 4.0–10.5)

## 2013-12-28 LAB — URINALYSIS, ROUTINE W REFLEX MICROSCOPIC
Bilirubin Urine: NEGATIVE
Glucose, UA: NEGATIVE mg/dL
Hgb urine dipstick: NEGATIVE
Ketones, ur: NEGATIVE mg/dL
Leukocytes, UA: NEGATIVE
Nitrite: NEGATIVE
Protein, ur: NEGATIVE mg/dL
Specific Gravity, Urine: 1.013 (ref 1.005–1.030)
Urobilinogen, UA: 0.2 mg/dL (ref 0.0–1.0)
pH: 7 (ref 5.0–8.0)

## 2013-12-28 LAB — COMPREHENSIVE METABOLIC PANEL
ALT: 13 U/L (ref 0–35)
AST: 19 U/L (ref 0–37)
Albumin: 3.6 g/dL (ref 3.5–5.2)
Alkaline Phosphatase: 76 U/L (ref 39–117)
Anion gap: 11 (ref 5–15)
BUN: 13 mg/dL (ref 6–23)
CO2: 27 mEq/L (ref 19–32)
Calcium: 9.5 mg/dL (ref 8.4–10.5)
Chloride: 103 mEq/L (ref 96–112)
Creatinine, Ser: 0.95 mg/dL (ref 0.50–1.10)
GFR calc Af Amer: 68 mL/min — ABNORMAL LOW (ref >90)
GFR calc non Af Amer: 59 mL/min — ABNORMAL LOW (ref >90)
Glucose, Bld: 88 mg/dL (ref 70–99)
Potassium: 4.6 mEq/L (ref 3.7–5.3)
Sodium: 141 mEq/L (ref 137–147)
Total Bilirubin: <0.2 mg/dL — ABNORMAL LOW (ref 0.3–1.2)
Total Protein: 7.8 g/dL (ref 6.0–8.3)

## 2013-12-28 LAB — SURGICAL PCR SCREEN
MRSA, PCR: NEGATIVE
STAPHYLOCOCCUS AUREUS: POSITIVE — AB

## 2013-12-28 LAB — APTT: aPTT: 33 seconds (ref 24–37)

## 2013-12-28 LAB — PROTIME-INR
INR: 1.03 (ref 0.00–1.49)
Prothrombin Time: 13.6 seconds (ref 11.6–15.2)

## 2013-12-28 NOTE — Progress Notes (Signed)
Nasal swab positive for staph per PAT visit on 12/28/2013 prescription for mupirocin called in [facility] [address] [facility] notified

## 2013-12-28 NOTE — Progress Notes (Signed)
PCR screening results per PAT visit 12/28/2013 in epic sent to Dr Darrelyn HillockGioffre

## 2013-12-28 NOTE — Progress Notes (Signed)
ECHO per epic 02/23/2010 Note on chart per Dr Richarda BladeAdamo

## 2013-12-29 ENCOUNTER — Other Ambulatory Visit: Payer: Self-pay | Admitting: Surgical

## 2013-12-29 MED ORDER — TRANEXAMIC ACID 100 MG/ML IV SOLN: 2000.0000 mg | Freq: Once | INTRAVENOUS | Status: DC

## 2013-12-29 NOTE — H&P (Signed)
TOTAL KNEE ADMISSION H&P  Patient is being admitted for right total knee arthroplasty.  Subjective:  Chief Complaint:right knee pain.  HPI: [name] [name], 73 y.o. female, has a history of pain and functional disability in the right knee due to arthritis and has failed non-surgical conservative treatments for greater than 12 weeks to includeNSAID's and/or analgesics, corticosteriod injections, viscosupplementation injections and activity modification.  Onset of symptoms was gradual, starting 8 years ago with gradually worsening course since that time. The patient noted no past surgery on the right knee(s).  Patient currently rates pain in the right knee(s) at 7 out of 10 with activity. Patient has night pain, worsening of pain with activity and weight bearing, pain that interferes with activities of daily living, pain with passive range of motion, crepitus and joint swelling.  Patient has evidence of periarticular osteophytes and joint space narrowing by imaging studies.There is no active infection.  Patient Active Problem List   Diagnosis Date Noted  . Well woman exam 11/16/2013  . Low back pain 10/12/2012  . Osteoarthritis, knee 08/27/2011  . ACE-inhibitor cough 11/07/2010  . MURMUR 02/15/2010  . ANEMIA, IRON DEFICIENCY 04/18/2009  . ESSENTIAL HYPERTENSION 04/07/2009  . HEMORRHOIDS, INTERNAL 03/09/2009  . DIVERTICULOSIS, SIGMOID COLON 03/09/2009  . OBESITY, CLASS I 12/14/2008  . TOBACCO USE 06/03/2006   Past Medical History  Diagnosis Date  . Hypertension   . Anemia     Fe def  . Postmenopausal status   . Arthritis   . GERD (gastroesophageal reflux disease)     Past Surgical History  Procedure Laterality Date  . Knee arthroplasty  04/19/10    Dr Luiz BlareGraves  . Cesarean section      x2  . Internal hemorrhoids    . Sigmoid diverticulosis    . Joint replacement  2011    left knee replacement  . Tubal ligation  1978  . Colonscopy       times 2    Current outpatient  prescriptions:  aspirin 81 MG tablet, Take 81 mg by mouth daily.  , Disp: , Rfl: ;   caffeine (VIVARIN) 200 MG TABS tablet, Take 200 mg by mouth daily., Disp: , Rfl: ;   cholecalciferol (VITAMIN D) 1000 UNITS tablet, Take 1,000 Units by mouth daily., Disp: , Rfl: ;   docusate sodium (COLACE) 100 MG capsule, Take 200 mg by mouth daily., Disp: , Rfl:  ferrous fumarate (HEMOCYTE - 106 MG FE) 325 (106 FE) MG TABS tablet, Take 1 tablet by mouth daily., Disp: , Rfl: ;   losartan-hydrochlorothiazide (HYZAAR) 50-12.5 MG per tablet, Take 1 tablet by mouth every other day., Disp: , Rfl: ;   vitamin B-12 (CYANOCOBALAMIN) 100 MCG tablet, Take 100 mcg by mouth daily., Disp: , Rfl:     No Known Allergies  History  Substance Use Topics  . Smoking status: Former Smoker -- 1.00 packs/day for 33 years    Types: Cigarettes    Quit date: 02/25/2009  . Smokeless tobacco: Never Used  . Alcohol Use: No    Family History  Problem Relation Age of Onset  . Hypertension Father   . Stroke Father   . Prostate cancer Brother      Review of Systems  Constitutional: Negative.   HENT: Negative.   Eyes: Negative.   Respiratory: Negative.   Cardiovascular: Negative.   Gastrointestinal: Positive for constipation. Negative for heartburn, nausea, vomiting, abdominal pain, diarrhea, blood in stool and melena.  Genitourinary: Negative.   Musculoskeletal: Positive for  back pain and joint pain. Negative for myalgias, falls and neck pain.       Right knee pain  Skin: Negative.   Neurological: Negative.   Endo/Heme/Allergies: Negative.   Psychiatric/Behavioral: Negative.     Objective:  Physical Exam  Constitutional: She appears well-developed. No distress.  Obese  HENT:  Head: Normocephalic and atraumatic.  Right Ear: External ear normal.  Left Ear: External ear normal.  Nose: Nose normal.  Mouth/Throat: Oropharynx is clear and moist.  Eyes: Conjunctivae and EOM are normal.  Neck: Normal range of motion.  Neck supple.  Cardiovascular: Normal rate, regular rhythm and intact distal pulses.   Murmur heard.  Systolic murmur is present with a grade of 3/6  Respiratory: Effort normal and breath sounds normal. No respiratory distress. She has no wheezes.  GI: Soft. Bowel sounds are normal. She exhibits no distension. There is no tenderness.  Musculoskeletal:       Right hip: Normal.       Left hip: Normal.       Right knee: She exhibits decreased range of motion and swelling. She exhibits no effusion and no erythema. Tenderness found. Medial joint line and lateral joint line tenderness noted.       Left knee: Normal.       Right lower leg: She exhibits no tenderness and no swelling.       Left lower leg: She exhibits no tenderness and no swelling.   The left knee, she is able to fully extend. Flexion back to 120 degrees. Incisions with no signs of infection. The right knee, mild soft tissue swelling. She does lack a couple of degrees of extension, flexion back to 110 degrees. She does have a valgus deformity noted in that right knee. She is tender along the lateral and medial joint line.  Neurological: She has normal strength. No sensory deficit.  Skin: She is not diaphoretic.   Vitals  Weight: 192 lb Height: 62in Body Surface Area: 1.88 m Body Mass Index: 35.12 kg/m  Pulse: 72 (Regular)  BP: 126/78 (Sitting, Left Arm, Standard)  Imaging Review Plain radiographs demonstrate severe degenerative joint disease of the right knee(s). The overall alignment ismild valgus. The bone quality appears to be good for age and reported activity level.  Assessment/Plan:  End stage arthritis, right knee   The patient history, physical examination, clinical judgment of the provider and imaging studies are consistent with end stage degenerative joint disease of the right knee(s) and total knee arthroplasty is deemed medically necessary. The treatment options including medical management, injection  therapy arthroscopy and arthroplasty were discussed at length. The risks and benefits of total knee arthroplasty were presented and reviewed. The risks due to aseptic loosening, infection, stiffness, patella tracking problems, thromboembolic complications and other imponderables were discussed. The patient acknowledged the explanation, agreed to proceed with the plan and consent was signed. Patient is being admitted for inpatient treatment for surgery, pain control, PT, OT, prophylactic antibiotics, VTE prophylaxis, progressive ambulation and ADL's and discharge planning. 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 Alyson LocketElena Adamo   Tyeler Goedken, PA-C

## 2014-01-04 ENCOUNTER — Encounter (HOSPITAL_COMMUNITY): Payer: Self-pay | Admitting: *Deleted

## 2014-01-04 ENCOUNTER — Encounter (HOSPITAL_COMMUNITY)
Admission: RE | Disposition: A | Discharge status: Skilled Nursing Facility | Payer: Self-pay | Source: Ambulatory Visit | Attending: Orthopedic Surgery

## 2014-01-04 ENCOUNTER — Inpatient Hospital Stay (HOSPITAL_COMMUNITY): Payer: Medicare HMO | Admitting: Anesthesiology

## 2014-01-04 ENCOUNTER — Inpatient Hospital Stay (HOSPITAL_COMMUNITY)
Admission: RE | Admit: 2014-01-04 | Discharge: 2014-01-06 | DRG: 470 | Disposition: A | Discharge status: Skilled Nursing Facility | Payer: Medicare HMO | Source: Ambulatory Visit | Attending: Orthopedic Surgery | Admitting: Orthopedic Surgery

## 2014-01-04 DIAGNOSIS — E669 Obesity, unspecified: Secondary | ICD-10-CM | POA: Diagnosis present

## 2014-01-04 DIAGNOSIS — Z96652 Presence of left artificial knee joint: Secondary | ICD-10-CM | POA: Diagnosis present

## 2014-01-04 DIAGNOSIS — K219 Gastro-esophageal reflux disease without esophagitis: Secondary | ICD-10-CM | POA: Diagnosis present

## 2014-01-04 DIAGNOSIS — Z01810 Encounter for preprocedural cardiovascular examination: Secondary | ICD-10-CM

## 2014-01-04 DIAGNOSIS — Z8249 Family history of ischemic heart disease and other diseases of the circulatory system: Secondary | ICD-10-CM | POA: Diagnosis not present

## 2014-01-04 DIAGNOSIS — Z87891 Personal history of nicotine dependence: Secondary | ICD-10-CM | POA: Diagnosis not present

## 2014-01-04 DIAGNOSIS — Z7982 Long term (current) use of aspirin: Secondary | ICD-10-CM | POA: Diagnosis not present

## 2014-01-04 DIAGNOSIS — Z823 Family history of stroke: Secondary | ICD-10-CM | POA: Diagnosis not present

## 2014-01-04 DIAGNOSIS — I10 Essential (primary) hypertension: Secondary | ICD-10-CM | POA: Diagnosis not present

## 2014-01-04 DIAGNOSIS — Z780 Asymptomatic menopausal state: Secondary | ICD-10-CM

## 2014-01-04 DIAGNOSIS — M1711 Unilateral primary osteoarthritis, right knee: Secondary | ICD-10-CM | POA: Diagnosis not present

## 2014-01-04 DIAGNOSIS — Z01812 Encounter for preprocedural laboratory examination: Secondary | ICD-10-CM | POA: Diagnosis not present

## 2014-01-04 DIAGNOSIS — M24561 Contracture, right knee: Secondary | ICD-10-CM | POA: Diagnosis not present

## 2014-01-04 DIAGNOSIS — Z8042 Family history of malignant neoplasm of prostate: Secondary | ICD-10-CM | POA: Diagnosis not present

## 2014-01-04 DIAGNOSIS — Z86718 Personal history of other venous thrombosis and embolism: Secondary | ICD-10-CM

## 2014-01-04 DIAGNOSIS — Z79899 Other long term (current) drug therapy: Secondary | ICD-10-CM

## 2014-01-04 DIAGNOSIS — Z96659 Presence of unspecified artificial knee joint: Secondary | ICD-10-CM

## 2014-01-04 DIAGNOSIS — M25561 Pain in right knee: Secondary | ICD-10-CM | POA: Diagnosis present

## 2014-01-04 DIAGNOSIS — Z6833 Body mass index (BMI) 33.0-33.9, adult: Secondary | ICD-10-CM | POA: Diagnosis not present

## 2014-01-04 HISTORY — DX: Presence of unspecified artificial knee joint: Z96.659

## 2014-01-04 HISTORY — PX: TOTAL KNEE ARTHROPLASTY: SHX125

## 2014-01-04 LAB — TYPE AND SCREEN
ABO/RH(D): A POS
Antibody Screen: POSITIVE
DAT, IgG: NEGATIVE
PT AG Type: NEGATIVE

## 2014-01-04 SURGERY — ARTHROPLASTY, KNEE, TOTAL
Anesthesia: Spinal | Site: Knee | Laterality: Right

## 2014-01-04 MED ORDER — HYDROCODONE-ACETAMINOPHEN 5-325 MG PO TABS
1.0000 | ORAL_TABLET | ORAL | Status: DC | PRN
Start: 2014-01-04 — End: 2014-01-06
  Administered 2014-01-06: 2 via ORAL
  Filled 2014-01-04: qty 2

## 2014-01-04 MED ORDER — CISATRACURIUM BESYLATE 20 MG/10ML IV SOLN
INTRAVENOUS | Status: AC
  Filled 2014-01-04: qty 10

## 2014-01-04 MED ORDER — DEXTROSE 5 % IV SOLN
500.0000 mg | Freq: Four times a day (QID) | INTRAVENOUS | Status: DC | PRN
  Administered 2014-01-04: 500 mg via INTRAVENOUS
  Filled 2014-01-04 (×2): qty 5

## 2014-01-04 MED ORDER — PROMETHAZINE HCL 25 MG/ML IJ SOLN: 6.2500 mg | INTRAMUSCULAR | Status: DC | PRN

## 2014-01-04 MED ORDER — SODIUM CHLORIDE 0.9 % IV SOLN
10.0000 mg | INTRAVENOUS | Status: DC | PRN
  Administered 2014-01-04: 20 ug/min via INTRAVENOUS

## 2014-01-04 MED ORDER — OXYCODONE-ACETAMINOPHEN 5-325 MG PO TABS
2.0000 | ORAL_TABLET | ORAL | Status: DC | PRN
  Administered 2014-01-04 – 2014-01-06 (×5): 2 via ORAL
  Filled 2014-01-04 (×7): qty 2

## 2014-01-04 MED ORDER — TRANEXAMIC ACID 100 MG/ML IV SOLN
2000.0000 mg | Freq: Once | INTRAVENOUS | Status: AC
  Administered 2014-01-04: 2000 mg via TOPICAL
  Filled 2014-01-04: qty 20

## 2014-01-04 MED ORDER — LOSARTAN POTASSIUM-HCTZ 50-12.5 MG PO TABS: 1.0000 | ORAL_TABLET | ORAL | Status: DC

## 2014-01-04 MED ORDER — PHENYLEPHRINE HCL 10 MG/ML IJ SOLN
INTRAMUSCULAR | Status: DC | PRN
  Administered 2014-01-04: 10 ug via INTRAVENOUS

## 2014-01-04 MED ORDER — ONDANSETRON HCL 4 MG PO TABS: 4.0000 mg | ORAL_TABLET | Freq: Four times a day (QID) | ORAL | Status: DC | PRN

## 2014-01-04 MED ORDER — BUPIVACAINE LIPOSOME 1.3 % IJ SUSP
INTRAMUSCULAR | Status: DC | PRN
  Administered 2014-01-04: 20 mL

## 2014-01-04 MED ORDER — CHLORHEXIDINE GLUCONATE 4 % EX LIQD: 60.0000 mL | Freq: Once | CUTANEOUS | Status: DC

## 2014-01-04 MED ORDER — OXYCODONE HCL 5 MG PO TABS: 5.0000 mg | ORAL_TABLET | Freq: Once | ORAL | Status: DC | PRN

## 2014-01-04 MED ORDER — BUPIVACAINE-EPINEPHRINE (PF) 0.25% -1:200000 IJ SOLN
INTRAMUSCULAR | Status: DC | PRN
Start: 2014-01-04 — End: 2014-01-04
  Administered 2014-01-04: 20 mL

## 2014-01-04 MED ORDER — ONDANSETRON HCL 4 MG/2ML IJ SOLN
INTRAMUSCULAR | Status: AC
  Filled 2014-01-04: qty 2

## 2014-01-04 MED ORDER — SODIUM CHLORIDE 0.9 % IR SOLN
Status: AC
  Filled 2014-01-04: qty 1

## 2014-01-04 MED ORDER — ONDANSETRON HCL 4 MG/2ML IJ SOLN: 4.0000 mg | Freq: Four times a day (QID) | INTRAMUSCULAR | Status: DC | PRN

## 2014-01-04 MED ORDER — BUPIVACAINE HCL (PF) 0.5 % IJ SOLN
INTRAMUSCULAR | Status: DC | PRN
  Administered 2014-01-04: 3 mL

## 2014-01-04 MED ORDER — HYDROCHLOROTHIAZIDE 12.5 MG PO CAPS
12.5000 mg | ORAL_CAPSULE | Freq: Every day | ORAL | Status: DC
  Administered 2014-01-04 – 2014-01-06 (×2): 12.5 mg via ORAL
  Filled 2014-01-04 (×3): qty 1

## 2014-01-04 MED ORDER — LIDOCAINE HCL (CARDIAC) 20 MG/ML IV SOLN
INTRAVENOUS | Status: DC | PRN
  Administered 2014-01-04: 75 mg via INTRAVENOUS

## 2014-01-04 MED ORDER — LACTATED RINGERS IV SOLN
INTRAVENOUS | Status: DC
  Administered 2014-01-04: 15:00:00 via INTRAVENOUS
  Administered 2014-01-04: 1000 mL via INTRAVENOUS

## 2014-01-04 MED ORDER — LACTATED RINGERS IV SOLN: INTRAVENOUS | Status: DC

## 2014-01-04 MED ORDER — ACETAMINOPHEN 650 MG RE SUPP: 650.0000 mg | Freq: Four times a day (QID) | RECTAL | Status: DC | PRN

## 2014-01-04 MED ORDER — SODIUM CHLORIDE 0.9 % IJ SOLN
INTRAMUSCULAR | Status: AC
  Filled 2014-01-04: qty 10

## 2014-01-04 MED ORDER — CEFAZOLIN SODIUM-DEXTROSE 2-3 GM-% IV SOLR
2.0000 g | INTRAVENOUS | Status: AC
  Administered 2014-01-04: 2 g via INTRAVENOUS

## 2014-01-04 MED ORDER — ATROPINE SULFATE 0.4 MG/ML IJ SOLN
INTRAMUSCULAR | Status: AC
  Filled 2014-01-04: qty 1

## 2014-01-04 MED ORDER — PHENOL 1.4 % MT LIQD: 1.0000 | OROMUCOSAL | Status: DC | PRN

## 2014-01-04 MED ORDER — ONDANSETRON HCL 4 MG/2ML IJ SOLN
INTRAMUSCULAR | Status: DC | PRN
Start: 2014-01-04 — End: 2014-01-04
  Administered 2014-01-04 (×2): 2 mg via INTRAVENOUS

## 2014-01-04 MED ORDER — HYDROMORPHONE HCL 1 MG/ML IJ SOLN: 0.2500 mg | INTRAMUSCULAR | Status: DC | PRN

## 2014-01-04 MED ORDER — POLYETHYLENE GLYCOL 3350 17 G PO PACK: 17.0000 g | PACK | Freq: Every day | ORAL | Status: DC | PRN

## 2014-01-04 MED ORDER — MIDAZOLAM HCL 2 MG/2ML IJ SOLN
INTRAMUSCULAR | Status: AC
  Filled 2014-01-04: qty 2

## 2014-01-04 MED ORDER — BUPIVACAINE-EPINEPHRINE (PF) 0.25% -1:200000 IJ SOLN
INTRAMUSCULAR | Status: AC
  Filled 2014-01-04: qty 30

## 2014-01-04 MED ORDER — ACETAMINOPHEN 325 MG PO TABS: 650.0000 mg | ORAL_TABLET | Freq: Four times a day (QID) | ORAL | Status: DC | PRN

## 2014-01-04 MED ORDER — DEXAMETHASONE SODIUM PHOSPHATE 10 MG/ML IJ SOLN
INTRAMUSCULAR | Status: AC
  Filled 2014-01-04: qty 1

## 2014-01-04 MED ORDER — BUPIVACAINE LIPOSOME 1.3 % IJ SUSP
20.0000 mL | Freq: Once | INTRAMUSCULAR | Status: DC
  Filled 2014-01-04: qty 20

## 2014-01-04 MED ORDER — DEXAMETHASONE SODIUM PHOSPHATE 10 MG/ML IJ SOLN
INTRAMUSCULAR | Status: DC | PRN
  Administered 2014-01-04: 10 mg via INTRAVENOUS

## 2014-01-04 MED ORDER — FERROUS SULFATE 325 (65 FE) MG PO TABS
325.0000 mg | ORAL_TABLET | Freq: Three times a day (TID) | ORAL | Status: DC
  Administered 2014-01-04 – 2014-01-06 (×3): 325 mg via ORAL
  Filled 2014-01-04 (×8): qty 1

## 2014-01-04 MED ORDER — THROMBIN 5000 UNITS EX SOLR
CUTANEOUS | Status: AC
  Filled 2014-01-04: qty 5000

## 2014-01-04 MED ORDER — RIVAROXABAN 10 MG PO TABS
10.0000 mg | ORAL_TABLET | Freq: Every day | ORAL | Status: DC
  Administered 2014-01-05 – 2014-01-06 (×2): 10 mg via ORAL
  Filled 2014-01-04 (×3): qty 1

## 2014-01-04 MED ORDER — FLEET ENEMA 7-19 GM/118ML RE ENEM: 1.0000 | ENEMA | Freq: Once | RECTAL | Status: AC | PRN

## 2014-01-04 MED ORDER — FENTANYL CITRATE 0.05 MG/ML IJ SOLN: INTRAMUSCULAR | Status: DC | PRN

## 2014-01-04 MED ORDER — LOSARTAN POTASSIUM 50 MG PO TABS
50.0000 mg | ORAL_TABLET | Freq: Every day | ORAL | Status: DC
  Administered 2014-01-06: 50 mg via ORAL
  Filled 2014-01-04 (×3): qty 1

## 2014-01-04 MED ORDER — SUFENTANIL CITRATE 50 MCG/ML IV SOLN
INTRAVENOUS | Status: DC | PRN
  Administered 2014-01-04: 5 ug via INTRAVENOUS
  Administered 2014-01-04: 10 ug via INTRAVENOUS
  Administered 2014-01-04: 5 ug via INTRAVENOUS

## 2014-01-04 MED ORDER — METHOCARBAMOL 500 MG PO TABS
500.0000 mg | ORAL_TABLET | Freq: Four times a day (QID) | ORAL | Status: DC | PRN
  Administered 2014-01-04 – 2014-01-06 (×3): 500 mg via ORAL
  Filled 2014-01-04 (×5): qty 1

## 2014-01-04 MED ORDER — CEFAZOLIN SODIUM 1-5 GM-% IV SOLN
1.0000 g | Freq: Four times a day (QID) | INTRAVENOUS | Status: AC
  Administered 2014-01-04 – 2014-01-05 (×2): 1 g via INTRAVENOUS
  Filled 2014-01-04 (×2): qty 50

## 2014-01-04 MED ORDER — LIDOCAINE HCL (CARDIAC) 20 MG/ML IV SOLN
INTRAVENOUS | Status: AC
  Filled 2014-01-04: qty 5

## 2014-01-04 MED ORDER — BISACODYL 5 MG PO TBEC: 5.0000 mg | DELAYED_RELEASE_TABLET | Freq: Every day | ORAL | Status: DC | PRN

## 2014-01-04 MED ORDER — OXYCODONE HCL 5 MG/5ML PO SOLN: 5.0000 mg | Freq: Once | ORAL | Status: DC | PRN

## 2014-01-04 MED ORDER — CELECOXIB 200 MG PO CAPS
200.0000 mg | ORAL_CAPSULE | Freq: Two times a day (BID) | ORAL | Status: DC
  Administered 2014-01-04 – 2014-01-06 (×3): 200 mg via ORAL
  Filled 2014-01-04 (×5): qty 1

## 2014-01-04 MED ORDER — PROPOFOL INFUSION 10 MG/ML OPTIME
INTRAVENOUS | Status: DC | PRN
  Administered 2014-01-04: 100 ug/kg/min via INTRAVENOUS

## 2014-01-04 MED ORDER — EPHEDRINE SULFATE 50 MG/ML IJ SOLN
INTRAMUSCULAR | Status: AC
  Filled 2014-01-04: qty 1

## 2014-01-04 MED ORDER — PROPOFOL 10 MG/ML IV BOLUS
INTRAVENOUS | Status: AC
  Filled 2014-01-04: qty 20

## 2014-01-04 MED ORDER — ALUM & MAG HYDROXIDE-SIMETH 200-200-20 MG/5ML PO SUSP: 30.0000 mL | ORAL | Status: DC | PRN

## 2014-01-04 MED ORDER — SODIUM CHLORIDE 0.9 % IJ SOLN
INTRAMUSCULAR | Status: AC
  Filled 2014-01-04: qty 20

## 2014-01-04 MED ORDER — HYDROMORPHONE HCL 1 MG/ML IJ SOLN
1.0000 mg | INTRAMUSCULAR | Status: DC | PRN
  Filled 2014-01-04: qty 1

## 2014-01-04 MED ORDER — PROPOFOL 10 MG/ML IV BOLUS
INTRAVENOUS | Status: DC | PRN
  Administered 2014-01-04: 100 mg via INTRAVENOUS

## 2014-01-04 MED ORDER — MENTHOL 3 MG MT LOZG
1.0000 | LOZENGE | OROMUCOSAL | Status: DC | PRN
  Filled 2014-01-04: qty 9

## 2014-01-04 MED ORDER — THROMBIN 5000 UNITS EX SOLR
OROMUCOSAL | Status: DC | PRN
  Administered 2014-01-04: 5 mL via TOPICAL

## 2014-01-04 MED ORDER — SUFENTANIL CITRATE 50 MCG/ML IV SOLN
INTRAVENOUS | Status: AC
  Filled 2014-01-04: qty 1

## 2014-01-04 MED ORDER — STERILE WATER FOR IRRIGATION IR SOLN
Status: DC | PRN
  Administered 2014-01-04: 1500 mL

## 2014-01-04 MED ORDER — SODIUM CHLORIDE 0.9 % IJ SOLN
INTRAMUSCULAR | Status: DC | PRN
  Administered 2014-01-04: 20 mL

## 2014-01-04 MED ORDER — SODIUM CHLORIDE 0.9 % IR SOLN
Status: DC | PRN
  Administered 2014-01-04: 500 mL

## 2014-01-04 MED ORDER — MIDAZOLAM HCL 5 MG/5ML IJ SOLN
INTRAMUSCULAR | Status: DC | PRN
  Administered 2014-01-04 (×2): 1 mg via INTRAVENOUS

## 2014-01-04 SURGICAL SUPPLY — 65 items
BAG ZIPLOCK 12X15 (MISCELLANEOUS) IMPLANT
BANDAGE ELASTIC 4 VELCRO ST LF (GAUZE/BANDAGES/DRESSINGS) ×2 IMPLANT
BANDAGE ELASTIC 6 VELCRO ST LF (GAUZE/BANDAGES/DRESSINGS) ×2 IMPLANT
BANDAGE ESMARK 6X9 LF (GAUZE/BANDAGES/DRESSINGS) ×1 IMPLANT
BLADE SAG 18X100X1.27 (BLADE) ×2 IMPLANT
BLADE SAW SGTL 11.0X1.19X90.0M (BLADE) ×2 IMPLANT
BNDG ESMARK 6X9 LF (GAUZE/BANDAGES/DRESSINGS) ×2
BONE CEMENT GENTAMICIN (Cement) ×4 IMPLANT
CAPT RP KNEE ×2 IMPLANT
CEMENT BONE GENTAMICIN 40 (Cement) ×2 IMPLANT
CUFF TOURN SGL QUICK 34 (TOURNIQUET CUFF) ×1
CUFF TRNQT CYL 34X4X40X1 (TOURNIQUET CUFF) ×1 IMPLANT
DRAPE EXTREMITY TIBURON (DRAPES) ×2 IMPLANT
DRAPE INCISE IOBAN 66X45 STRL (DRAPES) IMPLANT
DRAPE POUCH INSTRU U-SHP 10X18 (DRAPES) ×2 IMPLANT
DRAPE U-SHAPE 47X51 STRL (DRAPES) ×2 IMPLANT
DRSG AQUACEL AG ADV 3.5X10 (GAUZE/BANDAGES/DRESSINGS) ×2 IMPLANT
DRSG PAD ABDOMINAL 8X10 ST (GAUZE/BANDAGES/DRESSINGS) IMPLANT
DRSG TEGADERM 4X4.75 (GAUZE/BANDAGES/DRESSINGS) ×2 IMPLANT
DURAPREP 26ML APPLICATOR (WOUND CARE) ×2 IMPLANT
ELECT REM PT RETURN 9FT ADLT (ELECTROSURGICAL) ×2
ELECTRODE REM PT RTRN 9FT ADLT (ELECTROSURGICAL) ×1 IMPLANT
EVACUATOR 1/8 PVC DRAIN (DRAIN) ×2 IMPLANT
FACESHIELD WRAPAROUND (MASK) ×10 IMPLANT
GAUZE SPONGE 2X2 8PLY STRL LF (GAUZE/BANDAGES/DRESSINGS) ×1 IMPLANT
GLOVE BIOGEL PI IND STRL 6.5 (GLOVE) ×1 IMPLANT
GLOVE BIOGEL PI IND STRL 8 (GLOVE) ×1 IMPLANT
GLOVE BIOGEL PI INDICATOR 6.5 (GLOVE) ×1
GLOVE BIOGEL PI INDICATOR 8 (GLOVE) ×1
GLOVE ECLIPSE 8.0 STRL XLNG CF (GLOVE) ×4 IMPLANT
GLOVE SURG SS PI 6.5 STRL IVOR (GLOVE) ×2 IMPLANT
GOWN STRL REUS W/TWL LRG LVL3 (GOWN DISPOSABLE) ×2 IMPLANT
GOWN STRL REUS W/TWL XL LVL3 (GOWN DISPOSABLE) ×2 IMPLANT
HANDPIECE INTERPULSE COAX TIP (DISPOSABLE) ×1
IMMOBILIZER KNEE 20 (SOFTGOODS) ×2
IMMOBILIZER KNEE 20 THIGH 36 (SOFTGOODS) ×1 IMPLANT
KIT BASIN OR (CUSTOM PROCEDURE TRAY) ×2 IMPLANT
LIQUID BAND (GAUZE/BANDAGES/DRESSINGS) ×2 IMPLANT
MANIFOLD NEPTUNE II (INSTRUMENTS) ×2 IMPLANT
NEEDLE HYPO 22GX1.5 SAFETY (NEEDLE) ×4 IMPLANT
NS IRRIG 1000ML POUR BTL (IV SOLUTION) IMPLANT
PACK TOTAL JOINT (CUSTOM PROCEDURE TRAY) ×2 IMPLANT
PADDING CAST COTTON 6X4 STRL (CAST SUPPLIES) IMPLANT
POSITIONER SURGICAL ARM (MISCELLANEOUS) ×2 IMPLANT
SET HNDPC FAN SPRY TIP SCT (DISPOSABLE) ×1 IMPLANT
SET PAD KNEE POSITIONER (MISCELLANEOUS) ×2 IMPLANT
SPONGE GAUZE 2X2 STER 10/PKG (GAUZE/BANDAGES/DRESSINGS) ×1
SPONGE LAP 18X18 X RAY DECT (DISPOSABLE) IMPLANT
SPONGE SURGIFOAM ABS GEL 100 (HEMOSTASIS) ×2 IMPLANT
STAPLER VISISTAT 35W (STAPLE) IMPLANT
SUCTION FRAZIER 12FR DISP (SUCTIONS) ×2 IMPLANT
SUT BONE WAX W31G (SUTURE) ×2 IMPLANT
SUT MNCRL AB 4-0 PS2 18 (SUTURE) ×2 IMPLANT
SUT VIC AB 1 CT1 27 (SUTURE) ×2
SUT VIC AB 1 CT1 27XBRD ANTBC (SUTURE) ×2 IMPLANT
SUT VIC AB 2-0 CT1 27 (SUTURE) ×3
SUT VIC AB 2-0 CT1 TAPERPNT 27 (SUTURE) ×3 IMPLANT
SUT VLOC 180 0 24IN GS25 (SUTURE) ×2 IMPLANT
SYR 20CC LL (SYRINGE) ×6 IMPLANT
TOWEL OR 17X26 10 PK STRL BLUE (TOWEL DISPOSABLE) ×2 IMPLANT
TOWEL OR NON WOVEN STRL DISP B (DISPOSABLE) IMPLANT
TOWER CARTRIDGE SMART MIX (DISPOSABLE) ×2 IMPLANT
TRAY FOLEY CATH 14FRSI W/METER (CATHETERS) ×2 IMPLANT
WATER STERILE IRR 1500ML POUR (IV SOLUTION) ×2 IMPLANT
WRAP KNEE MAXI GEL POST OP (GAUZE/BANDAGES/DRESSINGS) ×2 IMPLANT

## 2014-01-04 NOTE — Anesthesia Preprocedure Evaluation (Addendum)
Anesthesia Evaluation  Patient identified by MRN, date of birth, ID band Patient awake    Reviewed: Allergy & Precautions, H&P , NPO status , Patient's Chart, lab work & pertinent test results  Airway Mallampati: III  TM Distance: >3 FB Neck ROM: Full    Dental  (+) Edentulous Upper, Edentulous Lower   Pulmonary neg shortness of breath, former smoker,          Cardiovascular METS: 5 - 7 Mets hypertension, Pt. on medications + Valvular Problems/Murmurs MR Rhythm:Regular Rate:Normal + Systolic murmurs 13082011 TTE: EF 65%55%. Mild P2 flail with moderate eccentric MR. Pt is asymptomatic and until recently able to walk up 2 flights of stairs carrying vacuum cleaner without SOB or CP. Pt independently performs ADL.   Neuro/Psych negative neurological ROS  negative psych ROS   GI/Hepatic Neg liver ROS, GERD-  ,  Endo/Other  negative endocrine ROS  Renal/GU negative Renal ROS     Musculoskeletal  (+) Arthritis -,   Abdominal   Peds  Hematology  (+) anemia ,   Anesthesia Other Findings   Reproductive/Obstetrics                           Anesthesia Physical Anesthesia Plan  ASA: III  Anesthesia Plan: Spinal   Post-op Pain Management:    Induction: Intravenous  Airway Management Planned: Natural Airway and Simple Face Mask  Additional Equipment:   Intra-op Plan:   Post-operative Plan:   Informed Consent: I have reviewed the patients History and Physical, chart, labs and discussed the procedure including the risks, benefits and alternatives for the proposed anesthesia with the patient or authorized representative who has indicated his/her understanding and acceptance.     Plan Discussed with: CRNA and Surgeon  Anesthesia Plan Comments:        Anesthesia Quick Evaluation

## 2014-01-04 NOTE — Anesthesia Postprocedure Evaluation (Signed)
Anesthesia Post Note  Patient: [name] [name]  Procedure(s) Performed: Procedure(s) (LRB): RIGHT TOTAL KNEE ARTHROPLASTY (Right)  Anesthesia type: Spinal  Patient location: PACU  Post pain: Pain level controlled  Post assessment: Post-op Vital signs reviewed  Last Vitals: BP 123/67 mmHg  Pulse 77  Temp(Src) 36.3 C (Oral)  Resp 15  Ht 5\' 3"  (1.6 m)  Wt 189 lb (85.73 kg)  BMI 33.49 kg/m2  SpO2 100%  Post vital signs: Reviewed  Level of consciousness: sedated  Complications: No apparent anesthesia complications

## 2014-01-04 NOTE — Interval H&P Note (Signed)
History and Physical Interval Note:  01/04/2014 12:35 PM  [name] CatholicBetty W Mancil  has presented today for surgery, with the diagnosis of OA RIGHT KNEE  The various methods of treatment have been discussed with the patient and family. After consideration of risks, benefits and other options for treatment, the patient has consented to  Procedure(s): RIGHT TOTAL KNEE ARTHROPLASTY (Right) as a surgical intervention .  The patient's history has been reviewed, patient examined, no change in status, stable for surgery.  I have reviewed the patient's chart and labs.  Questions were answered to the patient's satisfaction.     Amman Bartel A

## 2014-01-04 NOTE — Plan of Care (Signed)
Problem: Consults Goal: Total Joint Replacement Patient Education See Patient Education Module for education specifics. Outcome: Progressing Goal: Diagnosis- Total Joint Replacement Primary Total Knee Goal: Skin Care Protocol Initiated - if Braden Score 18 or less If consults are not indicated, leave blank or document N/A Outcome: Not Applicable Date Met:  38/88/28 Goal: Nutrition Consult-if indicated Outcome: Progressing Goal: Diabetes Guidelines if Diabetic/Glucose > 140 If diabetic or lab glucose is > 140 mg/dl - Initiate Diabetes/Hyperglycemia Guidelines & Document Interventions  Outcome: Not Applicable Date Met:  00/34/91  Problem: Phase I Progression Outcomes Goal: CMS/Neurovascular status WDL Outcome: Progressing Goal: Pain controlled with appropriate interventions Outcome: Progressing

## 2014-01-04 NOTE — Brief Op Note (Signed)
01/04/2014  2:49 PM  PATIENT:  [name] [name]  73 y.o. female  PRE-OPERATIVE DIAGNOSIS:  OA RIGHT KNEE,Obese  POST-OPERATIVE DIAGNOSIS:  OA RIGHT KNEE,Obese  PROCEDURE:  Procedure(s): RIGHT TOTAL KNEE ARTHROPLASTY (Right) and release of Flexion Contracture  SURGEON:  Surgeon(s) and Role:    * Jacki Conesonald A Jeyden Coffelt, MD - Primary  PHYSICIAN ASSISTANT: Dimitri PedAmber Constable PA  ASSISTANTS:Amber Brooksonstable PA  ANESTHESIA:   spinal  EBL:  Total I/O In: 1000 [I.V.:1000] Out: 150 [Urine:150]  BLOOD ADMINISTERED:none  DRAINS: (One) Hemovact drain(s) in the Right Knee with  Suction Open   LOCAL MEDICATIONS USED:  MARCAINE 20cc of 0.25% with Epinephrine and at end of case,20cc of Exparel mixed with 20cc of Normal Saline.    SPECIMEN:  No Specimen  DISPOSITION OF SPECIMEN:  N/A  COUNTS:  YES  TOURNIQUET:  * Missing tourniquet times found for documented tourniquets in log:  [id number] *  DICTATION: .Other Dictation: Dictation Number [id number]  PLAN OF CARE: Admit to inpatient   PATIENT DISPOSITION:  Stable in OR   Delay start of Pharmacological VTE agent (>24hrs) due to surgical blood loss or risk of bleeding: yes

## 2014-01-04 NOTE — Transfer of Care (Signed)
Immediate Anesthesia Transfer of Care Note  Patient: [name] [name]  Procedure(s) Performed: Procedure(s) (LRB): RIGHT TOTAL KNEE ARTHROPLASTY (Right)  Patient Location: PACU  Anesthesia Type: Spinal  Level of Consciousness: sedated, patient cooperative and responds to stimulation  Airway & Oxygen Therapy: Patient Spontanous Breathing and Patient connected to face mask oxgen  Post-op Assessment: Report given to PACU RN and Post -op Vital signs reviewed and stable  Post vital signs: Reviewed and stable  Complications: No apparent anesthesia complications

## 2014-01-04 NOTE — Anesthesia Procedure Notes (Signed)
Spinal Patient location during procedure: OR Start time: 01/04/2014 1:05 PM Staffing Resident/CRNA: Dion Saucier E Performed by: resident/CRNA  Preanesthetic Checklist Completed: patient identified, site marked, surgical consent, pre-op evaluation, timeout performed, IV checked, risks and benefits discussed and monitors and equipment checked Spinal Block Patient position: sitting Prep: Betadine Patient monitoring: heart rate, continuous pulse ox and blood pressure Approach: midline Location: L2-3 Injection technique: single-shot Needle Needle type: Spinocan  Needle gauge: 22 G Needle length: 9 cm Additional Notes Kit expiration date checked.  Clear csf good flow, negative heme, paresthesias.  Tolerated well.

## 2014-01-04 NOTE — Progress Notes (Signed)
Utilization review completed.  

## 2014-01-04 NOTE — Op Note (Signed)
NAMMarcene [name]:  [name], [name]                ACCOUNT NO.:  [id number]  MEDICAL RECORD NO.:  [id number]  LOCATION:  1603                         [facility]  PHYSICIAN:  Georges Lynchonald A. Pam Vanalstine, M.D.DATE OF BIRTH:  September 25, 1940  DATE OF PROCEDURE: DATE OF DISCHARGE:                              OPERATIVE REPORT   SURGEON:  Enola Siebers A. Darrelyn HillockGioffre, MD  ASSISTANT:  Dimitri PedAmber Constable, PA  PREOPERATIVE DIAGNOSES: 1. 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 2. Flexion contracture, right knee.  POSTOPERATIVE DIAGNOSES: 1. 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 2. Flexion contracture, right knee.  OPERATION:  Right total knee arthroplasty utilizing the DePuy system, all 3 components were cemented and gentamicin was used in the cement. The sizes used was a size 3 right femoral component, posterior cruciate sacrificing type, the tray was a size 2.5, the insert was a polyethylene rotating platform size 12.5 mm thickness.  The patella was a size 35 resurfacing patella.  DESCRIPTION OF PROCEDURE:  Under general anesthesia, routine orthopedic prep and draping of the right lower extremity was carried out.  At this time, the appropriate time-out was carried out first, also marked the appropriate right leg in the holding area.  The leg now was exsanguinated and Esmarch tourniquet was elevated to 350 mmHg.  The leg then was placed in a DeMayo knee holder.  The knee was flexed.  Note, she had 2 g IV Ancef preop.  I then made an incision over the anterior aspect of the right knee.  Bleeders were identified and cauterized.  Two flaps were created.  I then carried out a median parapatellar incision.  I reflected patella laterally, flexed the knee, and did medial and lateral meniscectomies and excised the anterior posterior cruciate ligaments.  Following that, a drill hole was made in the intercondylar notch in usual fashion.  The canal  finder was inserted up the canal and thoroughly irrigated out the femoral canal and then removed 14 mm thickness off the distal femur because of the severe contracture.  Following that, I then measured the femur to be a size 3 right.  I did my anterior, posterior, and chamfering cuts for a size 3 right femur.  Next attention was directed to the tibial plateau.  Tibia base plate was measured to be a size 2.5.  We then made our initial drill hole in the metaphyseal region of the plateau.  I then inserted my guide rod and removed 6 mm thickness off the affected lateral side of the knee.  Once this was done, we then inserted our lamina spreaders and removed the posterior spurs.  We then inserted our spacer blocks and elected to use a 12.5 mm spacer block.  We then utilized our spacer blocks to check in flexion, extension, and collateral ligament stability.  Spacer block was removed.  We then continued to prepare our tibia, the keel cut was made in the tibia in usual fashion.  After the tibia was prepared, we then went on and did our notch cut out of the distal femur.  Trial components were inserted, checked the range of motion, we felt 12.5  mm thickness insert was the best fit.  We then identified a resurfacing procedure on the patella for a size 35 patella. Three drill holes were made in the articular surface of the patella.  We then inserted the trial patellar component, had an excellent fit.  All trial components were removed.  We thoroughly water picked out the knee, cemented all 3 components in simultaneously, utilizing gentamicin in the cement.  Once cement was hardened, we then removed all loose pieces of cement.  We then water picked out the knee again to make sure there are no loose fragments present.  We then inserted our permanent rotating platform, 12.5 mm thickness size 3.  The knee was reduced.  We had excellent function and excellent stability.  Hemovac drain was inserted after  we first injected 20 mL of 0.25% Marcaine with epinephrine, that we did earlier.  We afterwards reduced the knee and inserted a Hemovac drain.  The wound was closed in layers in usual fashion.  We then later injected 20 mL of Exparel mix with 20 mL of normal saline.  Standard wound closure was done.  As I mentioned, the patient had 2 g IV Ancef.          ______________________________ Georges Lynchonald A. Darrelyn HillockGioffre, M.D.     RAG/MEDQ  D:  01/04/2014  T:  01/04/2014  Job:  [id number]

## 2014-01-04 NOTE — Plan of Care (Signed)
Problem: Phase I Progression Outcomes Goal: Hemodynamically stable Outcome: Completed/Met Date Met:  01/04/14

## 2014-01-05 ENCOUNTER — Encounter (HOSPITAL_COMMUNITY): Payer: Self-pay | Admitting: Orthopedic Surgery

## 2014-01-05 LAB — BASIC METABOLIC PANEL
Anion gap: 10 (ref 5–15)
BUN: 16 mg/dL (ref 6–23)
CHLORIDE: 103 meq/L (ref 96–112)
CO2: 25 mEq/L (ref 19–32)
Calcium: 8.7 mg/dL (ref 8.4–10.5)
Creatinine, Ser: 0.95 mg/dL (ref 0.50–1.10)
GFR calc non Af Amer: 59 mL/min — ABNORMAL LOW (ref >90)
GFR, EST AFRICAN AMERICAN: 68 mL/min — AB (ref >90)
Glucose, Bld: 162 mg/dL — ABNORMAL HIGH (ref 70–99)
POTASSIUM: 4.2 meq/L (ref 3.7–5.3)
SODIUM: 138 meq/L (ref 137–147)

## 2014-01-05 LAB — CBC
HEMATOCRIT: 33.3 % — AB (ref 36.0–46.0)
Hemoglobin: 10.5 g/dL — ABNORMAL LOW (ref 12.0–15.0)
MCH: 24.5 pg — ABNORMAL LOW (ref 26.0–34.0)
MCHC: 31.5 g/dL (ref 30.0–36.0)
MCV: 77.6 fL — ABNORMAL LOW (ref 78.0–100.0)
Platelets: 236 10*3/uL (ref 150–400)
RBC: 4.29 MIL/uL (ref 3.87–5.11)
RDW: 15.2 % (ref 11.5–15.5)
WBC: 14.2 10*3/uL — AB (ref 4.0–10.5)

## 2014-01-05 NOTE — Evaluation (Signed)
Occupational Therapy Evaluation Patient Details Name: [name] [name] MRN: [id number] DOB: 08/26/1940 Today's Date: 01/05/2014    History of Present Illness Pt is sp R TKA   Clinical Impression   Pt doing well and is super motivated. She needs cues to go a bit slower with activity for safety and for hand placement and LE management. Pt planning SNF.    Follow Up Recommendations  SNF;Supervision/Assistance - 24 hour    Equipment Recommendations  3 in 1 bedside comode    Recommendations for Other Services       Precautions / Restrictions Precautions Precautions: Knee Required Braces or Orthoses: Knee Immobilizer - Right Knee Immobilizer - Right: Discontinue once straight leg raise with < 10 degree lag Restrictions Weight Bearing Restrictions: No      Mobility Bed Mobility Overal bed mobility: Needs Assistance Bed Mobility: Supine to Sit     Supine to sit: Min assist;HOB elevated     General bed mobility comments: support R LE off the bed.  Transfers Overall transfer level: Needs assistance Equipment used: Rolling walker (2 wheeled) Transfers: Sit to/from Stand Sit to Stand: Mod assist         General transfer comment: verbal cues for hand placement and LE management.    Balance                                            ADL Overall ADL's : Needs assistance/impaired Eating/Feeding: Independent;Sitting   Grooming: Wash/dry face;Set up;Sitting   Upper Body Bathing: Set up;Sitting   Lower Body Bathing: Moderate assistance;Sit to/from stand   Upper Body Dressing : Set up;Sitting   Lower Body Dressing: Maximal assistance;Sit to/from stand   Toilet Transfer: Moderate assistance;RW;Stand-pivot   Toileting- Clothing Manipulation and Hygiene: Minimal assistance;Sit to/from stand         General ADL Comments: Pt performed sponge bath at EOB and did well with standing and washing periarea while holding to walker with one hand. Pt is very  motivated but needs cues for safety to go slower and for hand placement, etc. Pt planning SNF     Vision                     Perception     Praxis      Pertinent Vitals/Pain Pain Assessment: No/denies pain (pt states sore but not pain)     Hand Dominance     Extremity/Trunk Assessment Upper Extremity Assessment Upper Extremity Assessment: Overall WFL for tasks assessed           Communication     Cognition Arousal/Alertness: Awake/alert Behavior During Therapy: WFL for tasks assessed/performed Overall Cognitive Status: Within Functional Limits for tasks assessed                     General Comments       Exercises       Shoulder Instructions      Home Living Family/patient expects to be discharged to:: Skilled nursing facility Living Arrangements: Spouse/significant other                                      Prior Functioning/Environment Level of Independence: Independent             OT Diagnosis: Generalized weakness  OT Problem List: Decreased strength;Decreased knowledge of use of DME or AE   OT Treatment/Interventions: Self-care/ADL training;Patient/family education;Therapeutic activities;DME and/or AE instruction    OT Goals(Current goals can be found in the care plan section) Acute Rehab OT Goals Patient Stated Goal: return home after rehab. OT Goal Formulation: With patient Time For Goal Achievement: 01/12/14 Potential to Achieve Goals: Good  OT Frequency: Min 2X/week   Barriers to D/C:            Co-evaluation              End of Session Equipment Utilized During Treatment: Gait belt;Right knee immobilizer;Rolling walker  Activity Tolerance: Patient tolerated treatment well Patient left: in chair;with call bell/phone within reach   Time: 0900-0935 OT Time Calculation (min): 35 min Charges:  OT General Charges $OT Visit: 1 Procedure OT Evaluation $Initial OT Evaluation Tier I: 1  Procedure OT Treatments $Self Care/Home Management : 8-22 mins $Therapeutic Activity: 8-22 mins G-Codes:    Lennox LaityStone, Jagdeep Ancheta Stafford  [phone] 01/05/2014, 12:16 PM

## 2014-01-05 NOTE — Progress Notes (Signed)
Clinical Social Work Department CLINICAL SOCIAL WORK PLACEMENT NOTE 01/05/2014  Patient:  Dellie CatholicLSTON,Delma W  Account Number:  [id number] Admit date:  01/04/2014  Clinical Social Worker:  Cori RazorJAMIE Minetta Krisher, LCSW  Date/time:  01/05/2014 02:31 PM  Clinical Social Work is seeking post-discharge placement for this patient at the following level of care:   SKILLED NURSING   (*CSW will update this form in Epic as items are completed)     Patient/family provided with [facility] of Clinical [facility] Work's list of facilities offering this level of care within the geographic area requested by the patient (or if unable, by the patient's family).  01/05/2014  Patient/family informed of their freedom to choose among providers that offer the needed level of care, that participate in Medicare, Medicaid or managed care program needed by the patient, have an available bed and are willing to accept the patient.    Patient/family informed of [facility]' ownership interest in [facility], as well as of the fact that they are under no obligation to receive care at this facility.  PASARR submitted to EDS on 01/05/2014 PASARR number received on 01/05/2014  FL2 transmitted to all facilities in geographic area requested by pt/family on  01/05/2014 FL2 transmitted to all facilities within larger geographic area on   Patient informed that his/her managed care company has contracts with or will negotiate with  certain facilities, including the following:     Patient/family informed of bed offers received:  01/05/2014 [facility] Physician recommends and patient chooses bed at    Patient to be transferred to  on   Patient to be transferred to facility by  Patient and family notified of transfer on  Name of family member notified:    The following physician request were entered in Epic:   Additional Comments:  Cori RazorJamie Brooklynne Pereida LCSW [phone]

## 2014-01-05 NOTE — Evaluation (Signed)
Physical Therapy Evaluation Patient Details Name: [name] [name] MRN: [id number] DOB: 08/16/1940 Today's Date: 01/05/2014   History of Present Illness  Pt is sp R TKA  Clinical Impression  Pt s/p R TKR presents with decreased R LE strength/ROM and post op pain limiting functional mobility.  Pt will benefit from follow up rehab at SNF level to maximize IND and safety prior to return home    Follow Up Recommendations SNF    Equipment Recommendations  None recommended by PT    Recommendations for Other Services OT consult     Precautions / Restrictions Precautions Precautions: Knee Required Braces or Orthoses: Knee Immobilizer - Right Knee Immobilizer - Right: Discontinue once straight leg raise with < 10 degree lag Restrictions Weight Bearing Restrictions: No Other Position/Activity Restrictions: WBAT      Mobility  Bed Mobility Overal bed mobility: Needs Assistance Bed Mobility: Supine to Sit     Supine to sit: Min assist;HOB elevated     General bed mobility comments: support R LE off the bed.  Transfers Overall transfer level: Needs assistance Equipment used: Rolling walker (2 wheeled) Transfers: Sit to/from Stand Sit to Stand: Mod assist         General transfer comment: verbal cues for hand placement and LE management.  Ambulation/Gait Ambulation/Gait assistance: Mod assist;+2 physical assistance Ambulation Distance (Feet): 34 Feet Assistive device: Rolling walker (2 wheeled) Gait Pattern/deviations: Step-to pattern;Decreased step length - right;Decreased step length - left;Shuffle;Antalgic;Trunk flexed Gait velocity: decr   General Gait Details: Cues for posture, position from RW, sequence, stride length and increased UE WB  Stairs            Wheelchair Mobility    Modified Rankin (Stroke Patients Only)       Balance                                             Pertinent Vitals/Pain Pain Assessment: 0-10 Pain Score:  6  Pain Location: R knee Pain Descriptors / Indicators: Aching;Sore Pain Intervention(s): Limited activity within patient's tolerance;Monitored during session;Premedicated before session;Ice applied    Home Living Family/patient expects to be discharged to:: Skilled nursing facility Living Arrangements: Spouse/significant other                    Prior Function Level of Independence: Independent               Hand Dominance        Extremity/Trunk Assessment   Upper Extremity Assessment: Overall WFL for tasks assessed           Lower Extremity Assessment: RLE deficits/detail RLE Deficits / Details: 2/5 quads with AAROM at knee -10 - 30 with ++ muscle guarding    Cervical / Trunk Assessment: Normal  Communication   Communication: No difficulties  Cognition Arousal/Alertness: Awake/alert Behavior During Therapy: WFL for tasks assessed/performed Overall Cognitive Status: Within Functional Limits for tasks assessed                      General Comments      Exercises Total Joint Exercises Ankle Circles/Pumps: 10 reps;Supine;Both;AAROM Quad Sets: Both;10 reps;Supine;AAROM Heel Slides: AAROM;Right;10 reps;Supine Straight Leg Raises: AAROM;Right;10 reps;Supine      Assessment/Plan    PT Assessment Patient needs continued PT services  PT Diagnosis Difficulty walking   PT Problem List Decreased strength;Decreased  range of motion;Decreased activity tolerance;Decreased mobility;Decreased knowledge of use of DME;Pain;Obesity  PT Treatment Interventions DME instruction;Gait training;Stair training;Functional mobility training;Therapeutic activities;Therapeutic exercise;Patient/family education   PT Goals (Current goals can be found in the Care Plan section) Acute Rehab PT Goals Patient Stated Goal: return home after rehab. PT Goal Formulation: With patient Time For Goal Achievement: 01/12/14 Potential to Achieve Goals: Good    Frequency 7X/week    Barriers to discharge        Co-evaluation               End of Session Equipment Utilized During Treatment: Gait belt;Right knee immobilizer Activity Tolerance: Patient tolerated treatment well Patient left: in bed;with call bell/phone within reach Nurse Communication: Mobility status         Time: 1025-1055 PT Time Calculation (min) (ACUTE ONLY): 30 min   Charges:   PT Evaluation $Initial PT Evaluation Tier I: 1 Procedure PT Treatments $Gait Training: 8-22 mins $Therapeutic Exercise: 8-22 mins   PT G Codes:          Sumaya Riedesel 01/05/2014, 12:37 PM

## 2014-01-05 NOTE — Progress Notes (Signed)
Clinical Social Work Department BRIEF PSYCHOSOCIAL ASSESSMENT 01/05/2014  Patient:  [name] [name], [name] [name]     Account Number:  [id number]     Admit date:  01/04/2014  Clinical Social Worker:  Lacie Scotts  Date/Time:  01/05/2014 01:39 PM  Referred by:  Physician  Date Referred:  01/05/2014 Referred for  SNF Placement   Other Referral:   Interview type:  Patient Other interview type:    PSYCHOSOCIAL DATA Living Status:  HUSBAND Admitted from facility:   Level of care:   Primary support name:  Herbie Baltimore Primary support relationship to patient:  SPOUSE Degree of support available:   supportive    CURRENT CONCERNS Current Concerns  Post-Acute Placement   Other Concerns:    SOCIAL WORK ASSESSMENT / PLAN Pt is an 73 yr old female  living at home prior to hospitalization. CSW met with pt / spouse to assist with d/c planning. Pt has made prior arrangements to have ST Rehab at [facility] following hospital d/c. CSW has contacted SNF and d/c plans have been confirmed. Pt has [employer] which requires prior authorization. CSW has contacted insurance and authorization has been provided.   Assessment/plan status:  Psychosocial Support/Ongoing Assessment of Needs Other assessment/ plan:   Information/referral to community resources:   Insurance coverage for SNF and ambulance transport reviewed.    PATIENT'S/FAMILY'S RESPONSE TO PLAN OF CARE: Pt's mood is bright. She is motivated to work with therapy and looking forward to having rehab at [facility].    Werner Lean LCSW [phone]

## 2014-01-05 NOTE — Care Management Note (Signed)
    Page 1 of 1   01/05/2014     8:24:04 AM CARE MANAGEMENT NOTE 01/05/2014  Patient:  Dellie CatholicLSTON,Tempestt W   Account Number:  [id number]  Date Initiated:  01/05/2014  Documentation initiated by:  [facility] Healthcare CorporationJEFFRIES,Asheton Scheffler  Subjective/Objective Assessment:   ZOX:WRUEAadm:RIGHT TOTAL KNEE ARTHROPLASTY (Right)     Action/Plan:   discharge to SNF for rehab   Anticipated DC Date:  01/06/2014   Anticipated DC Plan:  SKILLED NURSING FACILITY      DC Planning Services  CM consult      Choice offered to / List presented to:             Status of service:  Completed, signed off Medicare Important Message given?   (If response is "NO", the following Medicare IM given date fields will be blank) Date Medicare IM given:   Medicare IM given by:   Date Additional Medicare IM given:   Additional Medicare IM given by:    Discharge Disposition:  SKILLED NURSING FACILITY  Per UR Regulation:    If discussed at Long Length of Stay Meetings, dates discussed:    Comments:  01/05/14 07:45 CM notes pt to go to SNF for rehab; CSW arranging.  No other CM needs were communicated.  Freddy JakschSarah Amore Ackman, BSN, CM [phone].

## 2014-01-05 NOTE — Progress Notes (Signed)
Physical Therapy Treatment Patient Details Name: [name] [name] MRN: [id number] DOB: 04/13/1940 Today's Date: 01/05/2014    History of Present Illness Pt is sp R TKA    PT Comments      Follow Up Recommendations  SNF     Equipment Recommendations  None recommended by PT    Recommendations for Other Services OT consult     Precautions / Restrictions Precautions Precautions: Knee Required Braces or Orthoses: Knee Immobilizer - Right Knee Immobilizer - Right: Discontinue once straight leg raise with < 10 degree lag Restrictions Weight Bearing Restrictions: No Other Position/Activity Restrictions: WBAT    Mobility  Bed Mobility Overal bed mobility: Needs Assistance Bed Mobility: Sit to Supine;Supine to Sit     Supine to sit: Min assist;HOB elevated Sit to supine: Min assist   General bed mobility comments: support R LE off the bed.  Transfers Overall transfer level: Needs assistance Equipment used: Rolling walker (2 wheeled) Transfers: Sit to/from Stand Sit to Stand: Mod assist         General transfer comment: verbal cues for hand placement and LE management.  Ambulation/Gait Ambulation/Gait assistance: Min assist;Mod assist Ambulation Distance (Feet): 45 Feet Assistive device: Rolling walker (2 wheeled) Gait Pattern/deviations: Step-to pattern;Decreased step length - right;Decreased step length - left;Shuffle;Trunk flexed Gait velocity: decr   General Gait Details: Cues for posture, position from RW, sequence, stride length and increased UE WB   Stairs            Wheelchair Mobility    Modified Rankin (Stroke Patients Only)       Balance                                    Cognition Arousal/Alertness: Awake/alert Behavior During Therapy: WFL for tasks assessed/performed Overall Cognitive Status: Within Functional Limits for tasks assessed                      Exercises      General Comments         Pertinent Vitals/Pain Pain Assessment: 0-10 Pain Score: 5  Pain Location: R knee Pain Descriptors / Indicators: Sore Pain Intervention(s): Limited activity within patient's tolerance;Monitored during session;Premedicated before session;Ice applied    Home Living Family/patient expects to be discharged to:: Skilled nursing facility Living Arrangements: Spouse/significant other                  Prior Function Level of Independence: Independent          PT Goals (current goals can now be found in the care plan section) Acute Rehab PT Goals Patient Stated Goal: return home after rehab. PT Goal Formulation: With patient Time For Goal Achievement: 01/12/14 Potential to Achieve Goals: Good Progress towards PT goals: Progressing toward goals    Frequency  7X/week    PT Plan Current plan remains appropriate    Co-evaluation             End of Session Equipment Utilized During Treatment: Gait belt;Right knee immobilizer Activity Tolerance: Patient tolerated treatment well Patient left: in bed;with call bell/phone within reach     Time: 1418-1436 PT Time Calculation (min) (ACUTE ONLY): 18 min  Charges:  $Gait Training: 8-22 mins                    G Codes:      [name] [name] 01/05/2014, 3:48 PM

## 2014-01-05 NOTE — Discharge Instructions (Addendum)
Walk with your walker. Weight bearing as tolerated Do not remove aquacel dressing unless it appears compromised. Shower only, no tub bath. Call if any temperatures greater than 101 or any wound complications: [phone] during the day and ask for Dr. Jeannetta EllisGioffre's nurse, Mackey Birchwoodammy Johnson.  Information on my medicine - XARELTO (Rivaroxaban)  This medication education was reviewed with me or my healthcare representative as part of my discharge preparation.  The pharmacist that spoke with me during my hospital stay was:  Jamse MeadGadhia, Jigna M, [facility]  Why was Xarelto prescribed for you? Xarelto was prescribed for you to reduce the risk of blood clots forming after orthopedic surgery. The medical term for these abnormal blood clots is venous thromboembolism (VTE).  What do you need to know about xarelto ? Take your Xarelto ONCE DAILY at the same time every day. You may take it either with or without food.  If you have difficulty swallowing the tablet whole, you may crush it and mix in applesauce just prior to taking your dose.  Take Xarelto exactly as prescribed by your doctor and DO NOT stop taking Xarelto without talking to the doctor who prescribed the medication.  Stopping without other VTE prevention medication to take the place of Xarelto may increase your risk of developing a clot.  After discharge, you should have regular check-up appointments with your healthcare provider that is prescribing your Xarelto.    What do you do if you miss a dose? If you miss a dose, take it as soon as you remember on the same day then continue your regularly scheduled once daily regimen the next day. Do not take two doses of Xarelto on the same day.   Important Safety Information A possible side effect of Xarelto is bleeding. You should call your healthcare provider right away if you experience any of the following: ? Bleeding from an injury or your nose that does not stop. ? Unusual colored urine (red or dark  brown) or unusual colored stools (red or black). ? Unusual bruising for unknown reasons. ? A serious fall or if you hit your head (even if there is no bleeding).  Some medicines may interact with Xarelto and might increase your risk of bleeding while on Xarelto. To help avoid this, consult your healthcare provider or pharmacist prior to using any new prescription or non-prescription medications, including herbals, vitamins, non-steroidal anti-inflammatory drugs (NSAIDs) and supplements.  This website has more information on Xarelto: [email or web address].

## 2014-01-05 NOTE — Progress Notes (Signed)
Subjective: 1 Day Post-Op Procedure(s) (LRB): RIGHT TOTAL KNEE ARTHROPLASTY (Right) Patient reports pain as 4 on 0-10 scale. Doing very well. Hemovac DCd. Will ambulate today.  Objective: Vital signs in last 24 hours: Temp:  [97.4 F (36.3 C)-97.8 F (36.6 C)] 97.8 F (36.6 C) (11/11 0606) Pulse Rate:  [53-92] 92 (11/11 0606) Resp:  [12-20] 16 (11/11 0606) BP: (108-140)/(44-78) 114/44 mmHg (11/11 0606) SpO2:  [96 %-100 %] 97 % (11/11 0606) Weight:  [85.73 kg (189 lb)] 85.73 kg (189 lb) (11/10 1103)  Intake/Output from previous day: 11/10 0701 - 11/11 0700 In: 4821.7 [P.O.:1920; I.V.:2901.7] Out: 1325 [Urine:1265; Drains:60] Intake/Output this shift:     Recent Labs  01/05/14 0406  HGB 10.5*    Recent Labs  01/05/14 0406  WBC 14.2*  RBC 4.29  HCT 33.3*  PLT 236    Recent Labs  01/05/14 0406  NA 138  K 4.2  CL 103  CO2 25  BUN 16  CREATININE 0.95  GLUCOSE 162*  CALCIUM 8.7   No results for input(s): LABPT, INR in the last 72 hours.  Dorsiflexion/Plantar flexion intact Compartment soft  Assessment/Plan: 1 Day Post-Op Procedure(s) (LRB): RIGHT TOTAL KNEE ARTHROPLASTY (Right) Up with therapy skilled nursing facility Thursday or Friday.  Nelida Mandarino A 01/05/2014, 7:41 AM

## 2014-01-06 LAB — TYPE AND SCREEN
ABO/RH(D): A POS
Antibody Screen: POSITIVE
DONOR AG TYPE: NEGATIVE
DONOR AG TYPE: NEGATIVE
Unit division: 0
Unit division: 0

## 2014-01-06 LAB — BASIC METABOLIC PANEL
ANION GAP: 11 (ref 5–15)
BUN: 19 mg/dL (ref 6–23)
CALCIUM: 8.6 mg/dL (ref 8.4–10.5)
CO2: 25 meq/L (ref 19–32)
Chloride: 98 mEq/L (ref 96–112)
Creatinine, Ser: 1.05 mg/dL (ref 0.50–1.10)
GFR calc Af Amer: 60 mL/min — ABNORMAL LOW (ref >90)
GFR calc non Af Amer: 52 mL/min — ABNORMAL LOW (ref >90)
Glucose, Bld: 119 mg/dL — ABNORMAL HIGH (ref 70–99)
POTASSIUM: 3.9 meq/L (ref 3.7–5.3)
SODIUM: 134 meq/L — AB (ref 137–147)

## 2014-01-06 LAB — CBC
HCT: 32.1 % — ABNORMAL LOW (ref 36.0–46.0)
Hemoglobin: 10 g/dL — ABNORMAL LOW (ref 12.0–15.0)
MCH: 24.3 pg — AB (ref 26.0–34.0)
MCHC: 31.2 g/dL (ref 30.0–36.0)
MCV: 78.1 fL (ref 78.0–100.0)
PLATELETS: 185 10*3/uL (ref 150–400)
RBC: 4.11 MIL/uL (ref 3.87–5.11)
RDW: 15.4 % (ref 11.5–15.5)
WBC: 13.2 10*3/uL — ABNORMAL HIGH (ref 4.0–10.5)

## 2014-01-06 MED ORDER — POLYETHYLENE GLYCOL 3350 17 G PO PACK: 17.0000 g | PACK | Freq: Every day | ORAL | Status: DC | PRN

## 2014-01-06 MED ORDER — RIVAROXABAN 10 MG PO TABS: 10.0000 mg | ORAL_TABLET | Freq: Every day | ORAL | Status: DC

## 2014-01-06 MED ORDER — DOCUSATE SODIUM 100 MG PO CAPS: 200.0000 mg | ORAL_CAPSULE | Freq: Two times a day (BID) | ORAL | Status: DC

## 2014-01-06 MED ORDER — OXYCODONE-ACETAMINOPHEN 5-325 MG PO TABS: 1.0000 | ORAL_TABLET | ORAL | Status: DC | PRN

## 2014-01-06 MED ORDER — METHOCARBAMOL 500 MG PO TABS: 500.0000 mg | ORAL_TABLET | Freq: Four times a day (QID) | ORAL | Status: DC | PRN

## 2014-01-06 MED ORDER — FERROUS SULFATE 325 (65 FE) MG PO TABS: 325.0000 mg | ORAL_TABLET | Freq: Three times a day (TID) | ORAL | Status: DC

## 2014-01-06 NOTE — Discharge Summary (Signed)
Physician Discharge Summary   Patient ID: [name] [name] MRN: [id number] DOB/AGE: 08/08/1940 73 y.o.  Admit date: 01/04/2014 Discharge date: 01/06/2014  Primary Diagnosis: Primary osteoarthritis, left knee   Admission Diagnoses:  Past Medical History  Diagnosis Date  . Hypertension   . Anemia     Fe def  . Postmenopausal status   . Arthritis   . GERD (gastroesophageal reflux disease)    Discharge Diagnoses:   Active Problems:   Total knee replacement status  Estimated body mass index is 33.49 kg/(m^2) as calculated from the following:   Height as of this encounter: 5' 3" (1.6 m).   Weight as of this encounter: 85.73 kg (189 lb).  Procedure:  Procedure(s) (LRB): RIGHT TOTAL KNEE ARTHROPLASTY (Right)   Consults: None  HPI: [name] [name], 73 y.o. female, has a history of pain and functional disability in the right knee due to arthritis and has failed non-surgical conservative treatments for greater than 12 weeks to includeNSAID's and/or analgesics, corticosteriod injections, viscosupplementation injections and activity modification. Onset of symptoms was gradual, starting 8 years ago with gradually worsening course since that time. The patient noted no past surgery on the right knee(s). Patient currently rates pain in the right knee(s) at 7 out of 10 with activity. Patient has night pain, worsening of pain with activity and weight bearing, pain that interferes with activities of daily living, pain with passive range of motion, crepitus and joint swelling. Patient has evidence of periarticular osteophytes and joint space narrowing by imaging studies.There is no active infection.  Laboratory Data: Admission on 01/04/2014  Component Date Value Ref Range Status  . ABO/RH(D) 01/04/2014 A POS   Final  . Antibody Screen 01/04/2014 POS   Final  . Sample Expiration 01/04/2014 01/07/2014   Final  . Unit Number 01/04/2014 [id number]   Final  . Blood Component Type 01/04/2014 RED  CELLS,LR   Final  . Unit division 01/04/2014 00   Final  . Status of Unit 01/04/2014 ALLOCATED   Final  . Donor AG Type 01/04/2014 NEGATIVE FOR KELL ANTIGEN   Final  . Transfusion Status 01/04/2014 OK TO TRANSFUSE   Final  . Crossmatch Result 01/04/2014 COMPATIBLE   Final  . Unit Number 01/04/2014 [id number]   Final  . Blood Component Type 01/04/2014 RED CELLS,LR   Final  . Unit division 01/04/2014 00   Final  . Status of Unit 01/04/2014 ALLOCATED   Final  . Donor AG Type 01/04/2014 NEGATIVE FOR KELL ANTIGEN   Final  . Transfusion Status 01/04/2014 OK TO TRANSFUSE   Final  . Crossmatch Result 01/04/2014 COMPATIBLE   Final  . WBC 01/05/2014 14.2* 4.0 - 10.5 K/uL Final  . RBC 01/05/2014 4.29  3.87 - 5.11 MIL/uL Final  . Hemoglobin 01/05/2014 10.5* 12.0 - 15.0 g/dL Final  . HCT 01/05/2014 33.3* 36.0 - 46.0 % Final  . MCV 01/05/2014 77.6* 78.0 - 100.0 fL Final  . MCH 01/05/2014 24.5* 26.0 - 34.0 pg Final  . MCHC 01/05/2014 31.5  30.0 - 36.0 g/dL Final  . RDW 01/05/2014 15.2  11.5 - 15.5 % Final  . Platelets 01/05/2014 236  150 - 400 K/uL Final  . Sodium 01/05/2014 138  137 - 147 mEq/L Final  . Potassium 01/05/2014 4.2  3.7 - 5.3 mEq/L Final  . Chloride 01/05/2014 103  96 - 112 mEq/L Final  . CO2 01/05/2014 25  19 - 32 mEq/L Final  . Glucose, Bld 01/05/2014 162* 70 - 99 mg/dL Final  .   BUN 01/05/2014 16  6 - 23 mg/dL Final  . Creatinine, Ser 01/05/2014 0.95  0.50 - 1.10 mg/dL Final  . Calcium 01/05/2014 8.7  8.4 - 10.5 mg/dL Final  . GFR calc non Af Amer 01/05/2014 59* >90 mL/min Final  . GFR calc Af Amer 01/05/2014 68* >90 mL/min Final   Comment: (NOTE) The eGFR has been calculated using the CKD EPI equation. This calculation has not been validated in all clinical situations. eGFR's persistently <90 mL/min signify possible Chronic Kidney Disease.   . Anion gap 01/05/2014 10  5 - 15 Final  . WBC 01/06/2014 13.2* 4.0 - 10.5 K/uL Final  . RBC 01/06/2014 4.11  3.87 - 5.11 MIL/uL  Final  . Hemoglobin 01/06/2014 10.0* 12.0 - 15.0 g/dL Final  . HCT 01/06/2014 32.1* 36.0 - 46.0 % Final  . MCV 01/06/2014 78.1  78.0 - 100.0 fL Final  . MCH 01/06/2014 24.3* 26.0 - 34.0 pg Final  . MCHC 01/06/2014 31.2  30.0 - 36.0 g/dL Final  . RDW 01/06/2014 15.4  11.5 - 15.5 % Final  . Platelets 01/06/2014 185  150 - 400 K/uL Final  . Sodium 01/06/2014 134* 137 - 147 mEq/L Final  . Potassium 01/06/2014 3.9  3.7 - 5.3 mEq/L Final  . Chloride 01/06/2014 98  96 - 112 mEq/L Final  . CO2 01/06/2014 25  19 - 32 mEq/L Final  . Glucose, Bld 01/06/2014 119* 70 - 99 mg/dL Final  . BUN 01/06/2014 19  6 - 23 mg/dL Final  . Creatinine, Ser 01/06/2014 1.05  0.50 - 1.10 mg/dL Final  . Calcium 01/06/2014 8.6  8.4 - 10.5 mg/dL Final  . GFR calc non Af Amer 01/06/2014 52* >90 mL/min Final  . GFR calc Af Amer 01/06/2014 60* >90 mL/min Final   Comment: (NOTE) The eGFR has been calculated using the CKD EPI equation. This calculation has not been validated in all clinical situations. eGFR's persistently <90 mL/min signify possible Chronic Kidney Disease.   . Anion gap 01/06/2014 11  5 - 15 Final  Hospital Outpatient Visit on 12/28/2013  Component Date Value Ref Range Status  . aPTT 12/28/2013 33  24 - 37 seconds Final  . Sodium 12/28/2013 141  137 - 147 mEq/L Final  . Potassium 12/28/2013 4.6  3.7 - 5.3 mEq/L Final  . Chloride 12/28/2013 103  96 - 112 mEq/L Final  . CO2 12/28/2013 27  19 - 32 mEq/L Final  . Glucose, Bld 12/28/2013 88  70 - 99 mg/dL Final  . BUN 12/28/2013 13  6 - 23 mg/dL Final  . Creatinine, Ser 12/28/2013 0.95  0.50 - 1.10 mg/dL Final  . Calcium 12/28/2013 9.5  8.4 - 10.5 mg/dL Final  . Total Protein 12/28/2013 7.8  6.0 - 8.3 g/dL Final  . Albumin 12/28/2013 3.6  3.5 - 5.2 g/dL Final  . AST 12/28/2013 19  0 - 37 U/L Final   Comment: SLIGHT HEMOLYSIS HEMOLYSIS AT THIS LEVEL MAY AFFECT RESULT   . ALT 12/28/2013 13  0 - 35 U/L Final  . Alkaline Phosphatase 12/28/2013 76  39 -  117 U/L Final  . Total Bilirubin 12/28/2013 <0.2* 0.3 - 1.2 mg/dL Final  . GFR calc non Af Amer 12/28/2013 59* >90 mL/min Final  . GFR calc Af Amer 12/28/2013 68* >90 mL/min Final   Comment: (NOTE) The eGFR has been calculated using the CKD EPI equation. This calculation has not been validated in all clinical situations. eGFR's persistently <90 mL/min signify   possible Chronic Kidney Disease.   . Anion gap 12/28/2013 11  5 - 15 Final  . Prothrombin Time 12/28/2013 13.6  11.6 - 15.2 seconds Final  . INR 12/28/2013 1.03  0.00 - 1.49 Final  . Color, Urine 12/28/2013 YELLOW  YELLOW Final  . APPearance 12/28/2013 CLOUDY* CLEAR Final  . Specific Gravity, Urine 12/28/2013 1.013  1.005 - 1.030 Final  . pH 12/28/2013 7.0  5.0 - 8.0 Final  . Glucose, UA 12/28/2013 NEGATIVE  NEGATIVE mg/dL Final  . Hgb urine dipstick 12/28/2013 NEGATIVE  NEGATIVE Final  . Bilirubin Urine 12/28/2013 NEGATIVE  NEGATIVE Final  . Ketones, ur 12/28/2013 NEGATIVE  NEGATIVE mg/dL Final  . Protein, ur 12/28/2013 NEGATIVE  NEGATIVE mg/dL Final  . Urobilinogen, UA 12/28/2013 0.2  0.0 - 1.0 mg/dL Final  . Nitrite 12/28/2013 NEGATIVE  NEGATIVE Final  . Leukocytes, UA 12/28/2013 NEGATIVE  NEGATIVE Final   MICROSCOPIC NOT DONE ON URINES WITH NEGATIVE PROTEIN, BLOOD, LEUKOCYTES, NITRITE, OR GLUCOSE <1000 mg/dL.  . ABO/RH(D) 12/28/2013 A POS   Final  . Antibody Screen 12/28/2013 POS   Final  . Sample Expiration 12/28/2013 01/03/2014   Final  . DAT, IgG 12/28/2013 NEG   Final  . Antibody Identification 93/26/7124 ANTI K   Final  . PT AG Type 12/28/2013 NEGATIVE FOR KELL ANTIGEN   Final  . WBC 12/28/2013 7.0  4.0 - 10.5 K/uL Final  . RBC 12/28/2013 4.74  3.87 - 5.11 MIL/uL Final  . Hemoglobin 12/28/2013 11.4* 12.0 - 15.0 g/dL Final  . HCT 12/28/2013 37.1  36.0 - 46.0 % Final  . MCV 12/28/2013 78.3  78.0 - 100.0 fL Final  . MCH 12/28/2013 24.1* 26.0 - 34.0 pg Final  . MCHC 12/28/2013 30.7  30.0 - 36.0 g/dL Final  . RDW  12/28/2013 15.3  11.5 - 15.5 % Final  . Platelets 12/28/2013 279  150 - 400 K/uL Final  . MRSA, PCR 12/28/2013 NEGATIVE  NEGATIVE Final  . Staphylococcus aureus 12/28/2013 POSITIVE* NEGATIVE Final   Comment:        The Xpert SA Assay (FDA approved for NASAL specimens in patients over 69 years of age), is one component of a comprehensive surveillance program.  Test performance has been validated by [employer] for patients greater than or equal to 73 year old. It is not intended to diagnose infection nor to guide or monitor treatment.   Office Visit on 11/16/2013  Component Date Value Ref Range Status  . CYTOLOGY - PAP 11/16/2013 PAP RESULT   Final  . Direct LDL 11/16/2013 100*  Final   Comment: ATP III Classification (LDL):                                < 100        mg/dL         Optimal                               100 - 129     mg/dL         Near or Above Optimal                               130 - 159     mg/dL         Borderline High  160 - 189     mg/dL         High                                > 190        mg/dL         Very High                             . WBC 11/16/2013 9.4  4.0 - 10.5 K/uL Final  . RBC 11/16/2013 4.89  3.87 - 5.11 MIL/uL Final  . Hemoglobin 11/16/2013 12.0  12.0 - 15.0 g/dL Final  . HCT 11/16/2013 37.3  36.0 - 46.0 % Final  . MCV 11/16/2013 76.3* 78.0 - 100.0 fL Final  . MCH 11/16/2013 24.5* 26.0 - 34.0 pg Final  . MCHC 11/16/2013 32.2  30.0 - 36.0 g/dL Final  . RDW 11/16/2013 16.1* 11.5 - 15.5 % Final  . Platelets 11/16/2013 266  150 - 400 K/uL Final      EKG: Orders placed or performed during the hospital encounter of 12/28/13  . EKG  . EKG     Hospital Course: [name] [name] is a 73 y.o. who was admitted to [facility]. They were brought to the operating room on 01/04/2014 and underwent Procedure(s): RIGHT TOTAL KNEE ARTHROPLASTY.  Patient tolerated the procedure well and was later transferred to  the recovery room and then to the orthopaedic floor for postoperative care.  They were given PO and IV analgesics for pain control following their surgery.  They were given 24 hours of postoperative antibiotics of  Anti-infectives    Start     Dose/Rate Route Frequency Ordered Stop   01/04/14 2000  ceFAZolin (ANCEF) IVPB 1 g/50 mL premix     1 g100 mL/hr over 30 Minutes Intravenous Every 6 hours 01/04/14 1813 01/05/14 0125   01/04/14 1345  polymyxin B 500,000 Units, bacitracin 50,000 Units in sodium chloride irrigation 0.9 % 500 mL irrigation  Status:  Discontinued       As needed 01/04/14 1345 01/04/14 1520   01/04/14 1047  ceFAZolin (ANCEF) IVPB 2 g/50 mL premix     2 g100 mL/hr over 30 Minutes Intravenous On call to O.R. 01/04/14 1047 01/04/14 1300     and started on DVT prophylaxis in the form of Xarelto.   PT and OT were ordered for total joint protocol.  Discharge planning consulted to help with postop disposition and equipment needs.  Patient had a difficult night on the evening of surgery due to pain.  They started to get up OOB with therapy on day one. Hemovac drain was pulled without difficulty.  Continued to work with therapy into day two. Patient progressing slowly with PT.   Patient was seen in rounds and was ready to go home.   Diet: Cardiac diet Activity:WBAT Follow-up:in 2 weeks Disposition - Skilled nursing facility Discharged Condition: stable   Discharge Instructions    Call MD / Call 911    Complete by:  As directed   If you experience chest pain or shortness of breath, CALL 911 and be transported to the hospital emergency room.  If you develope a fever above 101 F, pus (white drainage) or increased drainage or redness at the wound, or calf pain, call your surgeon's office.     Constipation Prevention      Complete by:  As directed   Drink plenty of fluids.  Prune juice may be helpful.  You may use a stool softener, such as Colace (over the counter) 100 mg twice a day.  Use  MiraLax (over the counter) for constipation as needed.     Diet - low sodium heart healthy    Complete by:  As directed      Discharge instructions    Complete by:  As directed   Walk with your walker. Weight bearing as tolerated Do not remove aquacel dressing unless it appears compromised. Shower only, no tub bath. Call if any temperatures greater than 101 or any wound complications: [phone] during the day and ask for Dr. Charlestine Night nurse, Brunilda Payor.     Do not put a pillow under the knee. Place it under the heel.    Complete by:  As directed      Driving restrictions    Complete by:  As directed   No driving     Increase activity slowly as tolerated    Complete by:  As directed             Medication List    STOP taking these medications        aspirin 81 MG tablet     cholecalciferol 1000 UNITS tablet  Commonly known as:  VITAMIN D     vitamin B-12 100 MCG tablet  Commonly known as:  CYANOCOBALAMIN     VIVARIN 200 MG Tabs tablet  Generic drug:  caffeine      TAKE these medications        docusate sodium 100 MG capsule  Commonly known as:  COLACE  Take 2 capsules (200 mg total) by mouth 2 (two) times daily.     ferrous fumarate 325 (106 FE) MG Tabs tablet  Commonly known as:  HEMOCYTE - 106 mg FE  Take 1 tablet by mouth daily.     ferrous sulfate 325 (65 FE) MG tablet  Take 1 tablet (325 mg total) by mouth 3 (three) times daily after meals.     losartan-hydrochlorothiazide 50-12.5 MG per tablet  Commonly known as:  HYZAAR  Take 1 tablet by mouth every other day.     methocarbamol 500 MG tablet  Commonly known as:  ROBAXIN  Take 1 tablet (500 mg total) by mouth every 6 (six) hours as needed for muscle spasms.     oxyCODONE-acetaminophen 5-325 MG per tablet  Commonly known as:  PERCOCET/ROXICET  Take 1-2 tablets by mouth every 4 (four) hours as needed for moderate pain.     polyethylene glycol packet  Commonly known as:  MIRALAX / GLYCOLAX  Take 17  g by mouth daily as needed for mild constipation.     rivaroxaban 10 MG Tabs tablet  Commonly known as:  XARELTO  Take 1 tablet (10 mg total) by mouth daily with breakfast.           Follow-up Information    Follow up with GIOFFRE,RONALD A, MD. Schedule an appointment as soon as possible for a visit in 2 weeks.   Specialty:  Orthopedic Surgery   Contact information:   [address] [place] [address] [phone]       Signed: Ardeen Jourdain, PA-C Orthopaedic Surgery 01/06/2014, 7:26 AM

## 2014-01-06 NOTE — Progress Notes (Signed)
Physical Therapy Treatment Patient Details Name: [name] CatholicBetty W Murad MRN: [id number] DOB: 04/02/1940 Today's Date: 01/06/2014    History of Present Illness Pt is sp R TKA    PT Comments    Pt for d/c to SNF this date.  Pt would benefit from nonemergency transport based on presence of knee brace and pt's difficulty rising sit to stand.  Pt also states husbands vehicle is large pickup that she had difficulty getting into even before surgery.  Follow Up Recommendations  SNF     Equipment Recommendations  None recommended by PT    Recommendations for Other Services OT consult     Precautions / Restrictions Precautions Precautions: Knee Required Braces or Orthoses: Knee Immobilizer - Right Knee Immobilizer - Right: Discontinue once straight leg raise with < 10 degree lag Restrictions Weight Bearing Restrictions: No Other Position/Activity Restrictions: WBAT    Mobility  Bed Mobility Overal bed mobility: Needs Assistance Bed Mobility: Supine to Sit     Supine to sit: Min assist     General bed mobility comments: support R LE off the bed.  Transfers Overall transfer level: Needs assistance Equipment used: Rolling walker (2 wheeled) Transfers: Sit to/from Stand Sit to Stand: Mod assist         General transfer comment: verbal cues for hand placement and LE management.  Ambulation/Gait Ambulation/Gait assistance: Min assist Ambulation Distance (Feet): 74 Feet Assistive device: Rolling walker (2 wheeled) Gait Pattern/deviations: Step-to pattern;Decreased step length - right;Decreased step length - left;Shuffle;Trunk flexed Gait velocity: decr   General Gait Details: Cues for posture, position from RW, sequence, stride length and increased UE WB   Stairs            Wheelchair Mobility    Modified Rankin (Stroke Patients Only)       Balance                                    Cognition Arousal/Alertness: Awake/alert Behavior During  Therapy: WFL for tasks assessed/performed Overall Cognitive Status: Within Functional Limits for tasks assessed                      Exercises Total Joint Exercises Ankle Circles/Pumps: Supine;Both;AAROM;15 reps Quad Sets: Both;Supine;AAROM;15 reps Heel Slides: AAROM;Right;Supine;20 reps Straight Leg Raises: AAROM;Right;Supine;20 reps Goniometric ROM: AAROM at R knee -10 - 40    General Comments        Pertinent Vitals/Pain Pain Assessment: 0-10 Pain Score: 5  Pain Location: R knee Pain Descriptors / Indicators: Aching;Sore Pain Intervention(s): Limited activity within patient's tolerance;Monitored during session;Premedicated before session;Ice applied    Home Living                      Prior Function            PT Goals (current goals can now be found in the care plan section) Acute Rehab PT Goals Patient Stated Goal: return home after rehab. PT Goal Formulation: With patient Time For Goal Achievement: 01/12/14 Potential to Achieve Goals: Good Progress towards PT goals: Progressing toward goals    Frequency  7X/week    PT Plan Current plan remains appropriate    Co-evaluation             End of Session Equipment Utilized During Treatment: Gait belt;Right knee immobilizer Activity Tolerance: Patient tolerated treatment well Patient left: with call bell/phone within reach;in chair  Time: [id number] PT Time Calculation (min) (ACUTE ONLY): 33 min  Charges:  $Gait Training: 8-22 mins $Therapeutic Exercise: 8-22 mins                    G Codes:      Carys Malina 01/06/2014, 12:36 PM

## 2014-01-06 NOTE — Progress Notes (Signed)
Clinical Social Work Department CLINICAL SOCIAL WORK PLACEMENT NOTE 01/06/2014  Patient:  [name] [name],[name] [name]  Account Number:  [id number] Admit date:  01/04/2014  Clinical Social Worker:  Cori RazorJAMIE Venson Ferencz, LCSW  Date/time:  01/05/2014 02:31 PM  Clinical Social Work is seeking post-discharge placement for this patient at the following level of care:   SKILLED NURSING   (*CSW will update this form in Epic as items are completed)     Patient/family provided with [facility] Department of Clinical Social Work's list of facilities offering this level of care within the geographic area requested by the patient (or if unable, by the patient's family).  01/05/2014  Patient/family informed of their freedom to choose among providers that offer the needed level of care, that participate in Medicare, Medicaid or managed care program needed by the patient, have an available bed and are willing to accept the patient.    Patient/family informed of [facility]' ownership interest in [facility], as well as of the fact that they are under no obligation to receive care at this facility.  PASARR submitted to EDS on 01/05/2014 PASARR number received on 01/05/2014  FL2 transmitted to all facilities in geographic area requested by pt/family on  01/05/2014 FL2 transmitted to all facilities within larger geographic area on   Patient informed that his/her managed care company has contracts with or will negotiate with  certain facilities, including the following:     Patient/family informed of bed offers received:  01[facility]05/2014 [facility] Physician recommends and patient chooses bed at    [facility]  01/06/2014 Patient to be transferred to facility by [facility] Patient and family notified of transfer on 01/06/2014 Name of family member notified:  SPOUSE  The following physician request were entered in Epic:   Additional Comments: Pt / spouse  are in agreement with d/c plan to SNF. [facility] transport required. [employer] provided authorization for SNF placement. NSG reviewed d/c summary, scripts, avs. Scripts are included in d/c packet.  Cori RazorJamie Marieanne Marxen LCSW [phone]

## 2014-01-06 NOTE — Progress Notes (Signed)
   Subjective: 2 Days Post-Op Procedure(s) (LRB): RIGHT TOTAL KNEE ARTHROPLASTY (Right) Patient reports pain as moderate.   Patient seen in rounds without Dr. Darrelyn HillockGioffre. Patient is well, and has had no acute complaints or problems other than soreness in the right knee. She denies SOB or chest pain. No issues overnight.  Plan is to go Skilled nursing facility after hospital stay.  Objective: Vital signs in last 24 hours: Temp:  [97 F (36.1 C)-99.2 F (37.3 C)] 99.2 F (37.3 C) (11/12 0515) Pulse Rate:  [75-85] 80 (11/12 0515) Resp:  [16-20] 20 (11/12 0515) BP: (115-127)/(46-63) 121/63 mmHg (11/12 0515) SpO2:  [96 %-99 %] 99 % (11/12 0515)  Intake/Output from previous day:  Intake/Output Summary (Last 24 hours) at 01/06/14 0722 Last data filed at 01/06/14 0515  Gross per 24 hour  Intake    480 ml  Output    300 ml  Net    180 ml     Labs:  Recent Labs  01/05/14 0406 01/06/14 0408  HGB 10.5* 10.0*    Recent Labs  01/05/14 0406 01/06/14 0408  WBC 14.2* 13.2*  RBC 4.29 4.11  HCT 33.3* 32.1*  PLT 236 185    Recent Labs  01/05/14 0406 01/06/14 0408  NA 138 134*  K 4.2 3.9  CL 103 98  CO2 25 25  BUN 16 19  CREATININE 0.95 1.05  GLUCOSE 162* 119*  CALCIUM 8.7 8.6   EXAM General - Patient is Alert and Oriented Extremity - Neurologically intact Intact pulses distally Dorsiflexion/Plantar flexion intact Compartment soft Dressing/Incision - clean, dry, no drainage Motor Function - intact, moving foot and toes well on exam.   Past Medical History  Diagnosis Date  . Hypertension   . Anemia     Fe def  . Postmenopausal status   . Arthritis   . GERD (gastroesophageal reflux disease)     Assessment/Plan: 2 Days Post-Op Procedure(s) (LRB): RIGHT TOTAL KNEE ARTHROPLASTY (Right) Active Problems:   Total knee replacement status  Estimated body mass index is 33.49 kg/(m^2) as calculated from the following:   Height as of this encounter: 5\' 3"  (1.6 m).   Weight as of this encounter: 85.73 kg (189 lb). Advance diet Up with therapy D/C IV fluids Discharge to SNF  DVT Prophylaxis - Xarelto Weight-Bearing as tolerated   Will continue PT in house today. Plan [facility] this afternoon.   Dimitri PedAmber Evangaline Jou, PA-C Orthopaedic Surgery 01/06/2014, 7:22 AM

## 2014-01-06 NOTE — Progress Notes (Signed)
Occupational Therapy Treatment Patient Details Name: [name] [name] MRN: [id number] DOB: 03/03/1940 Today's Date: 01/06/2014    History of present illness Pt is sp R TKA   OT comments  Pt limited by pain this session. She reports she had just recently had pain meds before OT arrived but did want to transfer to bathroom to void. Pt supposed to d/c SNF later today.     Follow Up Recommendations  SNF;Supervision/Assistance - 24 hour    Equipment Recommendations  3 in 1 bedside comode    Recommendations for Other Services      Precautions / Restrictions Precautions Precautions: Knee Required Braces or Orthoses: Knee Immobilizer - Right Knee Immobilizer - Right: Discontinue once straight leg raise with < 10 degree lag Restrictions Weight Bearing Restrictions: No Other Position/Activity Restrictions: WBAT       Mobility Bed Mobility            Transfers Overall transfer level: Needs assistance Equipment used: Rolling walker (2 wheeled) Transfers: Sit to/from Stand Sit to Stand: Mod assist         General transfer comment: verbal cues for hand placement and LE management.    Balance                                   ADL       Grooming: Wash/dry hands;Minimal assistance;Standing                   Toilet Transfer: Moderate assistance;RW;Ambulation;BSC             General ADL Comments: Pt reports just having had pain meds shortly before OT arrived but pt needing to go to the restroom. pt was stiff gettng up and down from the chair and 3in1 with OT and required increased time to mobilize into the bathroom. She needs constant cues for hand placement on walker as she tends to let go of walker often. She also attempts to sit before fully backing to surface. Plan for SNF today.       Vision                     Perception     Praxis      Cognition   Behavior During Therapy: WFL for tasks assessed/performed Overall  Cognitive Status: Within Functional Limits for tasks assessed                       Extremity/Trunk Assessment               Exercises    Shoulder Instructions       General Comments      Pertinent Vitals/ Pain       Pain Assessment: 0-10 Pain Score: 10-Worst pain ever Pain Location: R knee Pain Descriptors / Indicators: Sore Pain Intervention(s): Repositioned;Ice applied;Premedicated before session  Home Living                                          Prior Functioning/Environment              Frequency Min 2X/week     Progress Toward Goals  OT Goals(current goals can now be found in the care plan section)  Progress towards OT goals: Progressing toward goals  Acute Rehab OT  Goals Patient Stated Goal: return home after rehab.  Plan Discharge plan remains appropriate    Co-evaluation                 End of Session Equipment Utilized During Treatment: Rolling walker;Right knee immobilizer   Activity Tolerance Patient limited by pain   Patient Left in chair;with call bell/phone within reach   Nurse Communication          Time: [id number] OT Time Calculation (min): 25 min  Charges: OT General Charges $OT Visit: 1 Procedure OT Treatments $Self Care/Home Management : 8-22 mins $Therapeutic Activity: 8-22 mins  Lennox LaityStone, Catlin Doria Stafford  [phone] 01/06/2014, 12:48 PM

## 2014-01-06 NOTE — Plan of Care (Signed)
Problem: Phase II Progression Outcomes Goal: Ambulates Outcome: Completed/Met Date Met:  01/06/14 Goal: Tolerating diet Outcome: Completed/Met Date Met:  01/06/14 Goal: Discharge plan established Outcome: Progressing

## 2014-01-07 ENCOUNTER — Non-Acute Institutional Stay (SKILLED_NURSING_FACILITY): Payer: Commercial Managed Care - HMO | Admitting: Internal Medicine

## 2014-01-07 ENCOUNTER — Encounter: Payer: Self-pay | Admitting: Internal Medicine

## 2014-01-07 DIAGNOSIS — K5900 Constipation, unspecified: Secondary | ICD-10-CM

## 2014-01-07 DIAGNOSIS — I10 Essential (primary) hypertension: Secondary | ICD-10-CM

## 2014-01-07 DIAGNOSIS — M1711 Unilateral primary osteoarthritis, right knee: Secondary | ICD-10-CM

## 2014-01-07 DIAGNOSIS — D509 Iron deficiency anemia, unspecified: Secondary | ICD-10-CM

## 2014-01-07 NOTE — Progress Notes (Signed)
Patient ID: [name] [name], female   DOB: 09/22/1940, 73 y.o.   MRN: [id number]     [facility]   PCP: Beverely LowAdamo, Elena, MD  Code Status: full code  No Known Allergies  Chief Complaint  Patient presents with  . New Admit To SNF     HPI:  73 y/o female pt is here for STR post hospital admission from 01/04/14-01/06/14 with right knee OA. She underwent right knee arthroplasty. She is seen in her room today. She mentions that her pain is under control with current regimen. Her last bowel movement was 3 days back. Denies abdominal pain. Is passing flatus. No other concerns from pt or staff.  Review of Systems:  Constitutional: Negative for fever, chills, diaphoresis.  HENT: Negative for congestion Respiratory: Negative for cough and wheezing.   Cardiovascular: Negative for chest pain, palpitations, orthopnea and leg swelling.  Gastrointestinal: Negative for heartburn, nausea, vomiting, abdominal pain Genitourinary: Negative for dysuria Musculoskeletal: Negative for back pain, falls Skin: Negative for itching, sores and rash.  Neurological: Negative for weakness,headaches.  Psychiatric/Behavioral: Negative for depression  Past Medical History  Diagnosis Date  . Hypertension   . Anemia     Fe def  . Postmenopausal status   . Arthritis   . GERD (gastroesophageal reflux disease)    Past Surgical History  Procedure Laterality Date  . Knee arthroplasty  04/19/10    Dr Luiz BlareGraves  . Cesarean section      x2  . Internal hemorrhoids    . Sigmoid diverticulosis    . Joint replacement  2011    left knee replacement  . Tubal ligation  1978  . Colonscopy       times 2  . Total knee arthroplasty Right 01/04/2014    Procedure: RIGHT TOTAL KNEE ARTHROPLASTY;  Surgeon: Jacki Conesonald A Gioffre, MD;  Location: [facility];  Service: Orthopedics;  Laterality: Right;   Social History:   reports that she quit smoking about 4 years ago. Her smoking use included Cigarettes.  She has a 33 pack-year smoking history. She has never used smokeless tobacco. She reports that she does not drink alcohol or use illicit drugs.  Family History  Problem Relation Age of Onset  . Hypertension Father   . Stroke Father   . Prostate cancer Brother     Medications: Patient's Medications  New Prescriptions   No medications on file  Previous Medications   DOCUSATE SODIUM (COLACE) 100 MG CAPSULE    Take 2 capsules (200 mg total) by mouth 2 (two) times daily.   FERROUS FUMARATE (HEMOCYTE - 106 MG FE) 325 (106 FE) MG TABS TABLET    Take 1 tablet by mouth daily.   FERROUS SULFATE 325 (65 FE) MG TABLET    Take 1 tablet (325 mg total) by mouth 3 (three) times daily after meals.   LOSARTAN-HYDROCHLOROTHIAZIDE (HYZAAR) 50-12.5 MG PER TABLET    Take 1 tablet by mouth every other day.   METHOCARBAMOL (ROBAXIN) 500 MG TABLET    Take 1 tablet (500 mg total) by mouth every 6 (six) hours as needed for muscle spasms.   OXYCODONE-ACETAMINOPHEN (PERCOCET/ROXICET) 5-325 MG PER TABLET    Take 1-2 tablets by mouth every 4 (four) hours as needed for moderate pain.   POLYETHYLENE GLYCOL (MIRALAX / GLYCOLAX) PACKET    Take 17 g by mouth daily as needed for mild constipation.   RIVAROXABAN (XARELTO) 10 MG TABS TABLET    Take 1 tablet (10 mg  total) by mouth daily with breakfast.  Modified Medications   No medications on file  Discontinued Medications   No medications on file     Physical Exam: Filed Vitals:   01/07/14 1425  BP: 119/77  Pulse: 77  Temp: 98.3 F (36.8 C)  Resp: 18  SpO2: 96%    General- elderly female in no acute distress Head- atraumatic, normocephalic Eyes- no pallor, no icterus, no discharge Neck- no cervical lymphadenopathy Cardiovascular- normal s1,s2, no murmurs Respiratory- bilateral clear to auscultation, no wheeze, no rhonchi, no crackles, no use of accessory muscles Abdomen- bowel sounds present, soft, non tender Musculoskeletal- able to move all 4 extremities,  limited ROM on right knee area, no leg edema Neurological- no focal deficit Skin- warm and dry, has aquacel dressing to right knee with scant drainage Psychiatry- alert and oriented to person, place and time, normal mood and affect    Labs reviewed: Basic Metabolic Panel:  Recent Labs  04/54/909/05/09 0950 01/05/14 0406 01/06/14 0408  NA 141 138 134*  K 4.6 4.2 3.9  CL 103 103 98  CO2 27 25 25   GLUCOSE 88 162* 119*  BUN 13 16 19   CREATININE 0.95 0.95 1.05  CALCIUM 9.5 8.7 8.6   Liver Function Tests:  Recent Labs  12/28/13 0950  AST 19  ALT 13  ALKPHOS 76  BILITOT <0.2*  PROT 7.8  ALBUMIN 3.6   No results for input(s): LIPASE, AMYLASE in the last 8760 hours. No results for input(s): AMMONIA in the last 8760 hours. CBC:  Recent Labs  12/28/13 0950 01/05/14 0406 01/06/14 0408  WBC 7.0 14.2* 13.2*  HGB 11.4* 10.5* 10.0*  HCT 37.1 33.3* 32.1*  MCV 78.3 77.6* 78.1  PLT 279 236 185    Assessment/Plan  Right knee OA S/p left knee arthroplasty. Will have patient work with PT/OT as tolerated to regain strength and restore function.  Fall precautions are in place. Has f/u with orthopedics. Continue xarelto for dvt prophylaxis. Continue prn robaxin and percocet, no changes made  Constipation On colace 200 mg bid. Also on miralax, change to bid for now. No signs of bowel obstruction. Monitor  Iron def Anemia Continue iron supplement  HTN bp stable. Continue hyzaar current regimen   Family/ staff Communication: reviewed care plan with patient and nursing supervisor   Goals of care: short term rehabilitation    Labs/tests ordered- cbc, bmp in 1 week    Oneal GroutMAHIMA Cashtyn Pouliot, MD  [facility] [phone] (Monday-Friday 8 am - 5 pm) [phone] (afterhours)

## 2014-01-18 ENCOUNTER — Non-Acute Institutional Stay (SKILLED_NURSING_FACILITY): Payer: Commercial Managed Care - HMO | Admitting: Adult Health

## 2014-01-18 ENCOUNTER — Encounter: Payer: Self-pay | Admitting: Adult Health

## 2014-01-18 DIAGNOSIS — M1711 Unilateral primary osteoarthritis, right knee: Secondary | ICD-10-CM

## 2014-01-18 DIAGNOSIS — D509 Iron deficiency anemia, unspecified: Secondary | ICD-10-CM

## 2014-01-18 DIAGNOSIS — I10 Essential (primary) hypertension: Secondary | ICD-10-CM

## 2014-01-18 DIAGNOSIS — K5900 Constipation, unspecified: Secondary | ICD-10-CM

## 2014-01-18 NOTE — Progress Notes (Addendum)
Patient ID: [name] [name], female   DOB: 05/25/1940, 73 y.o.   MRN: [id number]   01/18/2014  Facility:  Nursing Home Location:  [facility] Room Number: 105-P LEVEL OF CARE:  SNF (31)   Chief Complaint  Patient presents with  . Discharge Note    Osteoarthritis S/P right total knee arthroplasty, constipation, anemia and hypertension    HISTORY OF PRESENT ILLNESS:  This is a 73 year old female who is for discharge home with home health PT and OT. DME: Rolling walker. She has been [facility]06[facility] with osteoarthritis status post right total knee arthroplasty. Patient was admitted to this facility for short-term rehabilitation after the patient's recent hospitalization.  Patient has completed SNF rehabilitation and therapy has cleared the patient for discharge.  REASSESSMENT OF ONGOING PROBLEMS:  HTN: Pt 's HTN remains stable.  Denies CP, sob, DOE, headaches, dizziness or visual disturbances.  No complications from the medications currently being used.  Last BP : 110/63  ANEMIA: The anemia has been stable. The patient denies fatigue, melena or hematochezia. No complications from the medications currently being used. 11/15 hgb 10.1  CONSTIPATION: The constipation remains stable. No complications from the medications presently being used. Patient denies ongoing constipation, abdominal pain, nausea or vomiting.  PAST MEDICAL HISTORY:  Past Medical History  Diagnosis Date  . Hypertension   . Anemia     Fe def  . Postmenopausal status   . Arthritis   . GERD (gastroesophageal reflux disease)     CURRENT MEDICATIONS: Reviewed per MAR/see medication list  No Known Allergies   REVIEW OF SYSTEMS:  GENERAL: no change in appetite, no fatigue, no weight changes, no fever, chills or weakness RESPIRATORY: no cough, SOB, DOE, wheezing, hemoptysis CARDIAC: no chest pain, edema or palpitations GI: no abdominal pain,  diarrhea, constipation, heart burn, nausea or vomiting  PHYSICAL EXAMINATION  GENERAL: no acute distress, obese EYES: conjunctivae normal, sclerae normal, normal eye lids NECK: supple, trachea midline, no neck masses, no thyroid tenderness, no thyromegaly LYMPHATICS: no LAN in the neck, no supraclavicular LAN RESPIRATORY: breathing is even & unlabored, BS CTAB CARDIAC: RRR, no murmur,no extra heart sounds, no edema GI: abdomen soft, normal BS, no masses, no tenderness, no hepatomegaly, no splenomegaly EXTREMITIES:  PSYCHIATRIC: the patient is alert & oriented to person, affect & behavior appropriate  LABS/RADIOLOGY: 01/07/14  WBC 10.7 hemoglobin 10.1 hematocrit 33.5 MCV 79.8 sodium 136 potassium 4.4  glucose 104 BUN 18 creatinine 0.8 calcium 9.4 Labs reviewed: Basic Metabolic Panel:  Recent Labs  78/29/5609/05/09 0950 01/05/14 0406 01/06/14 0408  NA 141 138 134*  K 4.6 4.2 3.9  CL 103 103 98  CO2 27 25 25   GLUCOSE 88 162* 119*  BUN 13 16 19   CREATININE 0.95 0.95 1.05  CALCIUM 9.5 8.7 8.6   Liver Function Tests:  Recent Labs  12/28/13 0950  AST 19  ALT 13  ALKPHOS 76  BILITOT <0.2*  PROT 7.8  ALBUMIN 3.6   CBC:  Recent Labs  12/28/13 0950 01/05/14 0406 01/06/14 0408  WBC 7.0 14.2* 13.2*  HGB 11.4* 10.5* 10.0*  HCT 37.1 33.3* 32.1*  MCV 78.3 77.6* 78.1  PLT 279 236 185    ASSESSMENT/PLAN:  Osteoarthritis status post right total knee arthroplasty - for home health PT and OT; continues Xarelto 10 mg 1 tab by mouth daily 1 more week then aspirin 325 mg 1 month; continue Robaxin 500 mg by  mouth every 6 hours when necessary and Percocet 5/325 mg 1-2 tabs by mouth every 4 hours when necessary for pain management Hypertension - well controlled; continue Hyzaar 50/12.5 mg by mouth every other day Anemia, iron deficiency - stable; hemoglobin 10.1 ; discontinue hemocyte and decrease ferrous sulfate to 325 mg 1 tab by mouth daily Constipation - stable; continue MiraLAX  17 g by mouth twice a day and Colace 200 mg by mouth twice a day   I have filled out patient's discharge paperwork and written prescriptions.  Patient will receive home health PT and OT.  DME provided: Rolling walker  Total discharge time: Greater than 30 minutes  Discharge time involved coordination of the discharge process with Child psychotherapistsocial worker, nursing staff and therapy department. Medical justification for home health services/DME verified.  CPT CODE: [id number]    [facility] ClearlakeMEDINA-VARGAS,MONINA, NP [facility] [phone]

## 2014-01-20 DIAGNOSIS — Z7901 Long term (current) use of anticoagulants: Secondary | ICD-10-CM

## 2014-01-20 DIAGNOSIS — Z96651 Presence of right artificial knee joint: Secondary | ICD-10-CM

## 2014-01-20 DIAGNOSIS — M1711 Unilateral primary osteoarthritis, right knee: Secondary | ICD-10-CM

## 2014-01-20 DIAGNOSIS — Z471 Aftercare following joint replacement surgery: Secondary | ICD-10-CM

## 2014-03-02 DIAGNOSIS — M1711 Unilateral primary osteoarthritis, right knee: Secondary | ICD-10-CM | POA: Diagnosis not present

## 2014-03-04 DIAGNOSIS — M1711 Unilateral primary osteoarthritis, right knee: Secondary | ICD-10-CM | POA: Diagnosis not present

## 2014-03-07 DIAGNOSIS — M1711 Unilateral primary osteoarthritis, right knee: Secondary | ICD-10-CM | POA: Diagnosis not present

## 2014-03-10 DIAGNOSIS — M1711 Unilateral primary osteoarthritis, right knee: Secondary | ICD-10-CM | POA: Diagnosis not present

## 2014-03-17 DIAGNOSIS — M1711 Unilateral primary osteoarthritis, right knee: Secondary | ICD-10-CM | POA: Diagnosis not present

## 2014-03-23 DIAGNOSIS — M1711 Unilateral primary osteoarthritis, right knee: Secondary | ICD-10-CM | POA: Diagnosis not present

## 2014-03-30 DIAGNOSIS — M1711 Unilateral primary osteoarthritis, right knee: Secondary | ICD-10-CM | POA: Diagnosis not present

## 2014-04-11 ENCOUNTER — Other Ambulatory Visit: Payer: Self-pay | Admitting: Adult Health

## 2014-04-18 DIAGNOSIS — Z96651 Presence of right artificial knee joint: Secondary | ICD-10-CM | POA: Diagnosis not present

## 2014-04-18 DIAGNOSIS — Z471 Aftercare following joint replacement surgery: Secondary | ICD-10-CM | POA: Diagnosis not present

## 2014-04-20 ENCOUNTER — Other Ambulatory Visit: Payer: Self-pay | Admitting: Adult Health

## 2014-05-07 ENCOUNTER — Other Ambulatory Visit: Payer: Self-pay | Admitting: Adult Health

## 2014-05-09 ENCOUNTER — Other Ambulatory Visit: Payer: Self-pay | Admitting: *Deleted

## 2014-05-09 DIAGNOSIS — D508 Other iron deficiency anemias: Secondary | ICD-10-CM

## 2014-05-09 DIAGNOSIS — I10 Essential (primary) hypertension: Secondary | ICD-10-CM

## 2014-05-09 MED ORDER — LOSARTAN POTASSIUM-HCTZ 50-12.5 MG PO TABS: 1.0000 | ORAL_TABLET | ORAL | Status: DC

## 2014-05-09 MED ORDER — FERROUS SULFATE 325 (65 FE) MG PO TABS: 325.0000 mg | ORAL_TABLET | Freq: Three times a day (TID) | ORAL | Status: DC

## 2014-06-23 ENCOUNTER — Encounter: Payer: Self-pay | Admitting: Family Medicine

## 2014-06-23 NOTE — Progress Notes (Signed)
Placed in MDs box. Deseree Blount, CMA  

## 2014-06-23 NOTE — Progress Notes (Signed)
Patient dropped off form to be completed for her to start working.  Please call her when completed.

## 2014-06-25 NOTE — Progress Notes (Signed)
Forms completed and return to [place], [place]

## 2014-06-27 NOTE — Progress Notes (Signed)
Left voice message for patient that form is completed and ready for pick up.  Clovis PuMartin, Tamika L, RN

## 2014-07-04 ENCOUNTER — Ambulatory Visit (INDEPENDENT_AMBULATORY_CARE_PROVIDER_SITE_OTHER): Payer: Commercial Managed Care - HMO | Admitting: *Deleted

## 2014-07-04 DIAGNOSIS — Z111 Encounter for screening for respiratory tuberculosis: Secondary | ICD-10-CM | POA: Diagnosis not present

## 2014-07-04 NOTE — Progress Notes (Signed)
   PPD placed Left Forearm.  Pt to return 07/06/2014 for reading.  Pt tolerated intradermal injection. Clovis PuMartin, Tamika L, RN

## 2014-07-13 ENCOUNTER — Other Ambulatory Visit: Payer: Self-pay

## 2014-07-13 ENCOUNTER — Ambulatory Visit (INDEPENDENT_AMBULATORY_CARE_PROVIDER_SITE_OTHER): Payer: Commercial Managed Care - HMO | Admitting: *Deleted

## 2014-07-13 ENCOUNTER — Other Ambulatory Visit: Payer: Self-pay | Admitting: Family Medicine

## 2014-07-13 DIAGNOSIS — Z1231 Encounter for screening mammogram for malignant neoplasm of breast: Secondary | ICD-10-CM

## 2014-07-13 DIAGNOSIS — Z111 Encounter for screening for respiratory tuberculosis: Secondary | ICD-10-CM | POA: Diagnosis not present

## 2014-07-13 LAB — TB SKIN TEST

## 2014-07-13 NOTE — Progress Notes (Signed)
   PPD placed Right Forearm.  Pt to return 07/15/14 for reading.  Pt tolerated intradermal injection. Clovis PuMartin, Tamika L, RN

## 2014-07-15 ENCOUNTER — Encounter: Payer: Self-pay | Admitting: *Deleted

## 2014-07-15 ENCOUNTER — Ambulatory Visit (INDEPENDENT_AMBULATORY_CARE_PROVIDER_SITE_OTHER): Payer: Commercial Managed Care - HMO | Admitting: *Deleted

## 2014-07-15 DIAGNOSIS — Z111 Encounter for screening for respiratory tuberculosis: Secondary | ICD-10-CM

## 2014-07-15 DIAGNOSIS — Z7689 Persons encountering health services in other specified circumstances: Secondary | ICD-10-CM

## 2014-07-15 LAB — TB SKIN TEST
INDURATION: 0 mm
TB Skin Test: NEGATIVE

## 2014-07-15 NOTE — Progress Notes (Signed)
   PPD Reading Note PPD read and results entered in EpicCare. Result: 0 mm induration. Interpretation: Negative If test not read within 48-72 hours of initial placement, patient advised to repeat in other arm 1-3 weeks after this test. Allergic reaction: no  Martin, Tamika L, RN  

## 2014-08-04 ENCOUNTER — Ambulatory Visit (HOSPITAL_COMMUNITY)
Admission: RE | Admit: 2014-08-04 | Discharge: 2014-08-04 | Disposition: A | Discharge status: Home / Self Care | Payer: Commercial Managed Care - HMO | Source: Ambulatory Visit | Attending: Family Medicine | Admitting: Family Medicine

## 2014-08-04 DIAGNOSIS — Z1231 Encounter for screening mammogram for malignant neoplasm of breast: Secondary | ICD-10-CM

## 2014-12-12 ENCOUNTER — Other Ambulatory Visit: Payer: Self-pay | Admitting: Family Medicine

## 2015-04-16 ENCOUNTER — Other Ambulatory Visit: Payer: Self-pay | Admitting: Family Medicine

## 2015-04-16 DIAGNOSIS — I10 Essential (primary) hypertension: Secondary | ICD-10-CM

## 2015-04-17 ENCOUNTER — Telehealth: Payer: Self-pay | Admitting: Family Medicine

## 2015-04-17 DIAGNOSIS — H547 Unspecified visual loss: Secondary | ICD-10-CM

## 2015-04-17 NOTE — Telephone Encounter (Signed)
Referral entered, please notify pt when complete. Thanks!

## 2015-04-17 NOTE — Telephone Encounter (Signed)
Pt is a requesting a referral to [facility] for an eye exam. The MD she is seeing is Dr. Viviann Spare Dingledein and the appt date is on 06/23/2015 @ 9:30. Please advise. Sadie Reynolds, ASA

## 2015-04-17 NOTE — Telephone Encounter (Signed)
Done

## 2015-05-03 ENCOUNTER — Ambulatory Visit (INDEPENDENT_AMBULATORY_CARE_PROVIDER_SITE_OTHER): Payer: Commercial Managed Care - HMO | Admitting: Family Medicine

## 2015-05-03 VITALS — BP 139/75 | HR 78 | Temp 97.7°F | Ht 63.0 in | Wt 185.9 lb

## 2015-05-03 DIAGNOSIS — Z23 Encounter for immunization: Secondary | ICD-10-CM | POA: Diagnosis not present

## 2015-05-03 DIAGNOSIS — I340 Nonrheumatic mitral (valve) insufficiency: Secondary | ICD-10-CM | POA: Diagnosis not present

## 2015-05-03 DIAGNOSIS — R29898 Other symptoms and signs involving the musculoskeletal system: Secondary | ICD-10-CM

## 2015-05-03 DIAGNOSIS — Z0000 Encounter for general adult medical examination without abnormal findings: Secondary | ICD-10-CM | POA: Diagnosis not present

## 2015-05-03 DIAGNOSIS — Z01419 Encounter for gynecological examination (general) (routine) without abnormal findings: Secondary | ICD-10-CM

## 2015-05-03 NOTE — Patient Instructions (Signed)
Mitral Valve Regurgitation Mitral valve regurgitation, also called mitral regurgitation, is when blood leaks from the mitral valve. The mitral valve is located between the upper left chamber of the heart (left atrium) and the lower left chamber of the heart (left ventricle). Normally, this valve opens when the atrium pumps blood into the ventricle, and it closes when the ventricle pumps blood out to the body. Mitral valve regurgitation happens when the mitral valve does not close properly. As a result, blood in the ventricle leaks back into the atrium. Mitral valve regurgitation causes the heart to work harder to pump blood. Over time, this can lead to heart failure. CAUSES  Causes of mitral valve regurgitation include:  Damage to the mitral valve, such as from a birth defect or heart attack.  Infection.  Heart disease.  Mitral valve prolapse. RISK FACTORS You are more likely to develop mitral valve regurgitation if you have:  Mitral valve prolapse.  A heart valve infection.  High blood pressure.  Coronary or rheumatic heart disease.  Swelling in your left ventricle.  Marfan syndrome.  Untreated syphilis. You are also more likely to develop the condition if you have taken certain diet pills in the past. SIGNS AND SYMPTOMS If the condition is mild, you may not have symptoms. If you do have symptoms, they can include:  Shortness of breath with physical activity, like climbing stairs.  Fast or irregular heartbeat.  Cough.  Excessive urination, especially at night.  Heavy breathing.  Extreme tiredness.  Lightheadedness. DIAGNOSIS To diagnose mitral valve regurgitation, your health care provider will listen to your heart for an abnormal heart sound (murmur). Your health care provider may also order tests, such as:  An echocardiogram.  A CT scan.  An MRI. TREATMENT  Treatment may include:  Medicines. These may be given to treat symptoms and prevent  complications.  Surgery to repair or replace the mitral valve. This may be done if:  Your heart becomes larger than normal.  Your heart cannot pump blood well.  Your symptoms get worse. HOME CARE INSTRUCTIONS   Take medicines only as directed by your health care provider.  Eat a heart-healthy diet. A dietitian can help you to plan meals.  Do not use any tobacco products, including cigarettes, chewing tobacco, and electronic cigarettes. If you need help quitting, ask your health care provider.  Work closely with your health care provider to manage lasting conditions, such as diabetes and high blood pressure.  Maintain a healthy weight and stay physically active. Ask your health care provider to recommend some activities that are safe for you to do.  Limit alcohol intake to no more than 1 drink per day for nonpregnant women and 2 drinks per day for men. One drink equals 12 ounces of beer, 5 ounces of wine, or 1 ounces of hard liquor.  Try to get at least 7 hours of sleep each night.  Find ways to manage stress. SEEK IMMEDIATE MEDICAL CARE IF:  You have shortness of breath.  You develop chest pain.  You sweat.  You feel nauseous.  You have swelling in your hands, feet, ankles, or abdomen that is getting worse.  You have a feeling of fullness in your abdomen.  You lose your appetite.  You feel dizzy or unsteady.  You have blurred vision or a headache.  Any of your symptoms begin to get worse.  You develop muscle aches, chills, or fever.  You feel ill.   This information is not intended to   replace advice given to you by your health care provider. Make sure you discuss any questions you have with your health care provider.   Document Released: 05/01/2004 Document Revised: 03/04/2014 Document Reviewed: 07/14/2013 Elsevier Interactive Patient Education 2016 [employer]  

## 2015-05-05 NOTE — Assessment & Plan Note (Signed)
Bilateral arm weakness without pain for several months, no weakness on exam, no numbness, tingling or neck pain. Suspect deconditions vs normal aging - pt for strengthening of upper extremities

## 2015-05-05 NOTE — Assessment & Plan Note (Signed)
Fairly healthy elderly woman. HTN well controlled. Still quite active, employed in childcare - flu and pna vaccines today - otherwise health maintenance utd - discussed diet and exercise recs to decrease cvd risk

## 2015-05-05 NOTE — Progress Notes (Signed)
   Subjective:   [name] [name] is a 75 y.o. female with a history of htn, arthritis here for well woman exam  Overall doing quite well. Active and working at daycare. Eats well with lots of veggies and watches her sugar and fried foods. Only concern currently is that she feels that her arms are weak when she lifts toddlers at work. The weakness is equal in both arms and not associated with any pain, numbness or tingling. She has no neck pain.  She has been told aout her murmur in the past but as never seen a cardiologist or had further work-up that she recalls. No chest pain, SOB, palpitations, dizziness or lightheadedness.  Review of Systems:  Per HPI. All other systems reviewed and are negative.   PMH, PSH, Medications, Allergies, and FmHx reviewed and updated in EMR.  Social History: former smoker  Objective:  BP 139/75 mmHg  Pulse 78  Temp(Src) 97.7 F (36.5 C) (Oral)  Ht 5\' 3"  (1.6 m)  Wt 185 lb 14.4 oz (84.324 kg)  BMI 32.94 kg/m2  Gen:  75 y.o. female in NAD HEENT: NCAT, MMM, EOMI, PERRL, anicteric sclerae CV: RRR, 3/6 holosystolic murmur, no RG, no JVD Resp: Non-labored, CTAB, no wheezes noted Abd: Soft, NTND, BS present, no guarding or organomegaly Ext: WWP, no edema MSK: Full ROM, strength, reflexes and sensation intact in bilateral upper extremities Neuro: Alert and oriented, speech normal      Chemistry      Component Value Date/Time   NA 134* 01/06/2014 0408   K 3.9 01/06/2014 0408   CL 98 01/06/2014 0408   CO2 25 01/06/2014 0408   BUN 19 01/06/2014 0408   CREATININE 1.05 01/06/2014 0408   CREATININE 0.95 10/12/2012 1038      Component Value Date/Time   CALCIUM 8.6 01/06/2014 0408   ALKPHOS 76 12/28/2013 0950   AST 19 12/28/2013 0950   ALT 13 12/28/2013 0950   BILITOT <0.2* 12/28/2013 0950      Lab Results  Component Value Date   WBC 13.2* 01/06/2014   HGB 10.0* 01/06/2014   HCT 32.1* 01/06/2014   MCV 78.1 01/06/2014   PLT 185 01/06/2014    Lab Results  Component Value Date   TSH 1.519 06/03/2006   No results found for: HGBA1C Assessment & Plan:     [name] CatholicBetty W Heimsoth is a 75 y.o. female here for well woman  Well woman exam Fairly healthy elderly woman. HTN well controlled. Still quite active, employed in childcare - flu and pna vaccines today - otherwise health maintenance utd - discussed diet and exercise recs to decrease cvd risk  Bilateral arm weakness Bilateral arm weakness without pain for several months, no weakness on exam, no numbness, tingling or neck pain. Suspect deconditions vs normal aging - pt for strengthening of upper extremities  Mitral regurgitation Loud holosystolic murmur, asymptomatic, echo from 2011 showing mitral regurg that "may be severe" - refer to cardiology for further evaluation and likely repeat echo at the least   Beverely LowElena Presleigh Feldstein, MD, MPH [facility] PGY-3 05/05/2015 3:12 PM

## 2015-05-05 NOTE — Assessment & Plan Note (Signed)
Loud holosystolic murmur, asymptomatic, echo from 2011 showing mitral regurg that "may be severe" - refer to cardiology for further evaluation and likely repeat echo at the least

## 2015-05-06 ENCOUNTER — Other Ambulatory Visit: Payer: Self-pay | Admitting: Family Medicine

## 2015-05-09 ENCOUNTER — Encounter: Payer: Self-pay | Admitting: Cardiovascular Disease

## 2015-05-11 ENCOUNTER — Other Ambulatory Visit: Payer: Self-pay | Admitting: *Deleted

## 2015-05-11 DIAGNOSIS — D509 Iron deficiency anemia, unspecified: Secondary | ICD-10-CM

## 2015-05-11 NOTE — Telephone Encounter (Signed)
Second request from [facility] for refill on ferrous sulfate. Fredderick SeveranceUCATTE, Keonna Raether L, RN

## 2015-05-12 MED ORDER — FERROUS SULFATE 325 (65 FE) MG PO TABS: ORAL_TABLET | ORAL | Status: DC

## 2015-05-24 ENCOUNTER — Telehealth: Payer: Self-pay | Admitting: Family Medicine

## 2015-05-24 NOTE — Telephone Encounter (Signed)
[facility] of right breast.  Patient having pain to area.

## 2015-05-25 NOTE — Telephone Encounter (Signed)
Pt had normal mammogram <1 year ago. She should come in to have the area evaluated as she may require other test or treatment besides mammogram. Thanks!

## 2015-05-25 NOTE — Telephone Encounter (Signed)
Left message on voicemail with message from MD, asked that she call back to schedule an appointment. 

## 2015-06-14 ENCOUNTER — Ambulatory Visit (INDEPENDENT_AMBULATORY_CARE_PROVIDER_SITE_OTHER): Payer: Commercial Managed Care - HMO | Admitting: Cardiovascular Disease

## 2015-06-14 ENCOUNTER — Encounter: Payer: Self-pay | Admitting: Cardiovascular Disease

## 2015-06-14 VITALS — BP 136/88 | HR 86 | Ht 63.0 in | Wt 189.8 lb

## 2015-06-14 DIAGNOSIS — I10 Essential (primary) hypertension: Secondary | ICD-10-CM

## 2015-06-14 DIAGNOSIS — R011 Cardiac murmur, unspecified: Secondary | ICD-10-CM | POA: Diagnosis not present

## 2015-06-14 HISTORY — DX: Cardiac murmur, unspecified: R01.1

## 2015-06-14 NOTE — Progress Notes (Signed)
Cardiology Office Note   Date:  06/14/2015   ID:  [name] [name], DOB 08/16/1940, MRN [id number]  PCP:  Beverely LowElena Adamo, MD  Cardiologist:   Vesta MixerNahser, [place] Kirchner J, MD   Chief Complaint  Patient presents with  . Heart Murmur   Problem List:  1. Heart murmur  2. Essential Hypertension    History of Present Illness: [name] [name] is a 75 y.o. female who presents for evaluation of a heart murmur. Was told about this by her primary MD  Has Known about this heart murmur for about 5 years. She denies any chest pain or shortness of breath. She remains very active. 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 She does her  and cleaning. She does her own shopping. She's never had any limitations.  [place]  [place]     Past Medical History  Diagnosis Date  . Hypertension   . Anemia     Fe def  . Postmenopausal status   . Arthritis   . GERD (gastroesophageal reflux disease)     Past Surgical History  Procedure Laterality Date  . Knee arthroplasty  04/19/10    Dr Luiz BlareGraves  . Cesarean section      x2  . Internal hemorrhoids    . Sigmoid diverticulosis    . Joint replacement  2011    left knee replacement  . Tubal ligation  1978  . Colonscopy       times 2  . 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 docusate sodium (COLACE) 100 MG capsule Take 2 capsules (200 mg total) by mouth 2 (two) times daily. 10 capsule 0  . ferrous fumarate (HEMOCYTE - 106 MG FE) 325 (106 FE) MG TABS tablet Take 1 tablet by mouth daily.    . ferrous sulfate 325 (65 FE) MG tablet TAKE 1 TABLET (325 MG TOTAL) BY MOUTH 3 (THREE) TIMES DAILY AFTER MEALS. 90 tablet 3  . losartan-hydrochlorothiazide (HYZAAR) 50-12.5 MG tablet TAKE 1 TABLET BY MOUTH  EVERY OTHER DAY. 45 tablet 3  . methocarbamol (ROBAXIN) 500 MG tablet Take 1 tablet (500 mg total) by mouth every 6 (six) hours as needed for muscle spasms. 40 tablet 1  . oxyCODONE-acetaminophen (PERCOCET/ROXICET) 5-325 MG per tablet Take 1-2 tablets by mouth every 4 (four) hours as needed for moderate pain. 60 tablet 0  . polyethylene glycol (MIRALAX / GLYCOLAX) packet Take 17 g by mouth daily as needed for mild constipation. (Patient taking differently: Take 17 g by mouth 2 (two) times daily. ) 14 each 0   No current facility-administered medications for this visit.   Facility-Administered Medications Ordered in Other Visits  Medication Dose Route Frequency Provider Last Rate Last Dose  . bupivacaine liposome (EXPAREL) 1.3 % injection 266 mg  20 mL Infiltration Once Marriottmber Constable, PA-C      . dexamethasone (DECADRON) injection 10 mg  10 mg Intravenous Once Marriottmber Constable, PA-C      . tranexamic acid (CYKLOKAPRON) 2,000 mg in sodium chloride 0.9 % 50 mL Topical Application  2,000 mg Topical Once Marriottmber Constable, PA-C        Allergies:   Review of patient's allergies indicates no known allergies.    Social History:  The patient  reports that she quit smoking about 6 years ago. Her smoking use included Cigarettes. She has a 33 pack-year smoking history. She has never used smokeless tobacco. She reports that she does not drink alcohol or use illicit drugs.   Family History:  The patient's family history includes Hypertension in her father; Prostate cancer in her brother; Stroke in her father.    ROS:  Please see the history of present illness.    Review of Systems: Constitutional:  denies fever, chills, diaphoresis, appetite change and fatigue.  HEENT: denies photophobia, eye pain, redness, hearing loss, ear pain, congestion, sore throat, rhinorrhea, sneezing, neck pain, neck stiffness and tinnitus.  Respiratory: denies SOB, DOE, cough, chest tightness, and wheezing.  Cardiovascular:  denies chest pain, palpitations and leg swelling.  Gastrointestinal: denies nausea, vomiting, abdominal pain, diarrhea, constipation, blood in stool.  Genitourinary: denies dysuria, urgency, frequency, hematuria, flank pain and difficulty urinating.  Musculoskeletal: denies  myalgias, back pain, joint swelling, arthralgias and gait problem.   Skin: denies pallor, rash and wound.  Neurological: denies dizziness, seizures, syncope, weakness, light-headedness, numbness and headaches.   Hematological: denies adenopathy, easy bruising, personal or family bleeding history.  Psychiatric/ Behavioral: denies suicidal ideation, mood changes, confusion, nervousness, sleep disturbance and agitation.       All other systems are reviewed and negative.    PHYSICAL EXAM: VS:  BP 136/88 mmHg  Pulse 86  Ht  (1.6 m)  Wt 189 lb 12.8 oz (86.093 kg)  BMI 33.63 kg/m2 , BMI Body mass index is 33.63 kg/(m^2). GEN: Well nourished, well developed, in no acute distress HEENT: normal Neck: no JVD, carotid bruits, or masses Cardiac: RRR; 1-2 /6 systolic  Murmur at left sternal border.   , rubs, or gallops,no edema  Respiratory:  clear to auscultation bilaterally, normal work of breathing GI: soft, nontender, nondistended, + BS MS: no deformity or atrophy Skin: warm and dry, no rash Neuro:  Strength and sensation are intact Psych: normal   EKG:  EKG is ordered today. The ekg ordered today demonstrates  NSR at 86.  Sinus arrhythmia.  No ST or T wave changes.    Recent Labs: No results found for requested labs within last 365 days.    Lipid Panel    Component Value Date/Time   CHOL 173 10/12/2012 1038   TRIG 96 10/12/2012 1038   HDL 43 10/12/2012 1038   CHOLHDL 4.0 10/12/2012 1038   VLDL 19 10/12/2012 1038   LDLCALC 111* 10/12/2012 1038   LDLDIRECT 100* 11/16/2013 1556      Wt Readings from Last 3 Encounters:  06/14/15 189 lb 12.8 oz (86.093 kg)  05/03/15 185 lb 14.4 oz (84.324 kg)    01/18/14 195 lb 6.4 oz (88.633 kg)      Other studies Reviewed: Additional studies/ records that were reviewed today include: . Review of the above records demonstrates:    ASSESSMENT AND PLAN:  1.  Heart murmur: Zarriah presents with systolic heart murmur that she is known about for the past 4 or 5 years. She has no symptoms. The murmurs consistent with either mitral regurgitation or tricuspid regurgitation. There is only slight radiation to the axilla. There is not any radiation to the upper right sternal border to suggest aortic stenosis.  We'll get an echocardiogram for further evaluation. At this point she is completely symptom-free. I will plan on seeing her in one year.  Current medicines are reviewed at length with the patient today.  The  patient does not have concerns regarding medicines.  The following changes have been made:  no change  Labs/ tests ordered today include:  No orders of the defined types were placed in this encounter.     Disposition:   FU with me in 1 year.      Gudelia Eugene, Deloris Ping, MD  06/14/2015 10:18 AM    [facility] [address] [place], [place], [place]  [address] Phone: [phone]; Fax: [phone]   [facility]  [address] [place], [place]  [address] [phone]   Fax [phone]

## 2015-06-14 NOTE — Patient Instructions (Signed)
Medication Instructions:  Your physician recommends that you continue on your current medications as directed. Please refer to the Current Medication list given to you today.   Labwork: None Ordered   Testing/Procedures: Your physician has requested that you have an echocardiogram. Echocardiography is a painless test that uses sound waves to create images of your heart. It provides your doctor with information about the size and shape of your heart and how well your heart's chambers and valves are working. This procedure takes approximately one hour. There are no restrictions for this procedure.   Follow-Up: Your physician wants you to follow-up in: 1 year with Dr. Nahser. You will receive a reminder letter in the mail two months in advance. If you don't receive a letter, please call our office to schedule the follow-up appointment.   If you need a refill on your cardiac medications before your next appointment, please call your pharmacy.   Thank you for choosing [facility]! Kalyn Dimattia, RN [phone]    

## 2015-06-16 ENCOUNTER — Ambulatory Visit (INDEPENDENT_AMBULATORY_CARE_PROVIDER_SITE_OTHER): Payer: Commercial Managed Care - HMO | Admitting: Family Medicine

## 2015-06-16 ENCOUNTER — Encounter: Payer: Self-pay | Admitting: Family Medicine

## 2015-06-16 VITALS — BP 130/65 | HR 83 | Temp 98.0°F | Ht 63.0 in | Wt 187.1 lb

## 2015-06-16 DIAGNOSIS — N644 Mastodynia: Secondary | ICD-10-CM | POA: Diagnosis not present

## 2015-06-20 ENCOUNTER — Other Ambulatory Visit: Payer: Self-pay | Admitting: Family Medicine

## 2015-06-20 DIAGNOSIS — N644 Mastodynia: Secondary | ICD-10-CM

## 2015-06-20 NOTE — Progress Notes (Signed)
   Subjective:   [name] [name] is a 75 y.o. female with a history of HTN and mitral regurg, here for breast pain  Pt reports about 2 months of sharp pain in her breasts that started after she was given flu and pneumonia vaccines at her last visit. The pains radiate from under her arms through her breast to the nipples and are more common on her right but happen in both. She denies and lumps, skin changes, nipple discharge. The pain is occasional and only lasts a few minutes but she has not identified any triggers. She does ask whether the shots could have caused it.  Review of Systems:  Per HPI. All other systems reviewed and are negative.   PMH, PSH, Medications, Allergies, and FmHx reviewed and updated in EMR.  Social History: former smoker  Objective:  BP 130/65 mmHg  Pulse 83  Temp(Src) 98 F (36.7 C) (Oral)  Ht 5\' 3"  (1.6 m)  Wt 187 lb 1.6 oz (84.868 kg)  BMI 33.15 kg/m2  Gen:  75 y.o. female in NAD HEENT: NCAT, MMM, anicteric sclerae CV: RRR, no MRG Resp: Non-labored, CTAB, no wheezes noted Abd: Soft, NTND, BS present, no guarding or organomegaly Ext: WWP, no edema Breast: Normal breast and axillary tissue without masses or lymphadenopathy, no tenderness or nipple discharge, normal overlying skin  Neuro: Alert and oriented, speech normal      Chemistry      Component Value Date/Time   NA 134* 01/06/2014 0408   K 3.9 01/06/2014 0408   CL 98 01/06/2014 0408   CO2 25 01/06/2014 0408   BUN 19 01/06/2014 0408   CREATININE 1.05 01/06/2014 0408   CREATININE 0.95 10/12/2012 1038      Component Value Date/Time   CALCIUM 8.6 01/06/2014 0408   ALKPHOS 76 12/28/2013 0950   AST 19 12/28/2013 0950   ALT 13 12/28/2013 0950   BILITOT <0.2* 12/28/2013 0950      Lab Results  Component Value Date   WBC 13.2* 01/06/2014   HGB 10.0* 01/06/2014   HCT 32.1* 01/06/2014   MCV 78.1 01/06/2014   PLT 185 01/06/2014   Lab Results  Component Value Date   TSH 1.519 06/03/2006    No results found for: HGBA1C Assessment & Plan:     [name] [name] is a 75 y.o. female here for breast pain  Breast pain in female Shooting pains from underarm to nipple, R>L, no palpable mass - unclear etiology, will refer for diagnostic mammo as this is patients primary concern, suspect it is MSK pain    Beverely LowElena Manolito Jurewicz, MD, MPH [facility] PGY-3 06/20/2015 7:52 AM

## 2015-06-20 NOTE — Assessment & Plan Note (Signed)
Shooting pains from underarm to nipple, R>L, no palpable mass - unclear etiology, will refer for diagnostic mammo as this is patients primary concern, suspect it is MSK pain

## 2015-06-23 DIAGNOSIS — H401131 Primary open-angle glaucoma, bilateral, mild stage: Secondary | ICD-10-CM | POA: Diagnosis not present

## 2015-06-26 ENCOUNTER — Ambulatory Visit
Admission: RE | Admit: 2015-06-26 | Discharge: 2015-06-26 | Disposition: A | Discharge status: Home / Self Care | Payer: Commercial Managed Care - HMO | Source: Ambulatory Visit | Attending: Family Medicine | Admitting: Family Medicine

## 2015-06-26 DIAGNOSIS — N644 Mastodynia: Secondary | ICD-10-CM

## 2015-06-28 ENCOUNTER — Ambulatory Visit (HOSPITAL_COMMUNITY): Payer: Commercial Managed Care - HMO | Attending: Internal Medicine

## 2015-06-30 DIAGNOSIS — H401131 Primary open-angle glaucoma, bilateral, mild stage: Secondary | ICD-10-CM | POA: Diagnosis not present

## 2015-07-21 DIAGNOSIS — H401131 Primary open-angle glaucoma, bilateral, mild stage: Secondary | ICD-10-CM | POA: Diagnosis not present

## 2015-09-01 DIAGNOSIS — H401131 Primary open-angle glaucoma, bilateral, mild stage: Secondary | ICD-10-CM | POA: Diagnosis not present

## 2015-09-06 ENCOUNTER — Telehealth: Payer: Self-pay | Admitting: Internal Medicine

## 2015-09-06 NOTE — Telephone Encounter (Signed)
Was seen at ENT this morning. Per dr,  she needs a referral sent to Dr Narda Bondshris Newman [phone] (phone )

## 2015-09-07 NOTE — Telephone Encounter (Signed)
Called ENT office about patient needing referral. Patient had shown up to their office wanting to be seen for a problem with her ears. No other information was given. Called patient to ask about her symptoms. Left message asking her to call [facility] to describe her symptoms more fully and that we may be able to address her concerns with a clinic appointment. If she ultimately needs an ENT referral, Dr. Allene PyoNewman's office at [phone] will need prior authorization insurance information.

## 2015-10-04 ENCOUNTER — Other Ambulatory Visit: Payer: Self-pay | Admitting: *Deleted

## 2015-10-04 DIAGNOSIS — D509 Iron deficiency anemia, unspecified: Secondary | ICD-10-CM

## 2015-10-04 MED ORDER — FERROUS SULFATE 325 (65 FE) MG PO TABS: ORAL_TABLET | ORAL | 3 refills | Status: DC

## 2016-01-02 ENCOUNTER — Telehealth: Payer: Self-pay | Admitting: Internal Medicine

## 2016-01-02 DIAGNOSIS — H401131 Primary open-angle glaucoma, bilateral, mild stage: Secondary | ICD-10-CM | POA: Diagnosis not present

## 2016-01-02 NOTE — Telephone Encounter (Signed)
Done. Faxed to the office.

## 2016-01-02 NOTE — Telephone Encounter (Signed)
Pt has an appointment with [facility] today at 10:45 AM. They need an referral.   Dr. Dellie Burnsingeldein  NPI [id number]  The office didn't know the diagnose code. Their fax number is [phone].  Annice PihJackie

## 2016-01-03 ENCOUNTER — Encounter: Payer: Self-pay | Admitting: Internal Medicine

## 2016-01-03 ENCOUNTER — Ambulatory Visit (INDEPENDENT_AMBULATORY_CARE_PROVIDER_SITE_OTHER): Payer: Commercial Managed Care - HMO | Admitting: Internal Medicine

## 2016-01-03 VITALS — BP 135/78 | HR 73 | Temp 97.4°F | Ht 63.0 in | Wt 186.6 lb

## 2016-01-03 DIAGNOSIS — R490 Dysphonia: Secondary | ICD-10-CM

## 2016-01-03 DIAGNOSIS — Z23 Encounter for immunization: Secondary | ICD-10-CM | POA: Diagnosis not present

## 2016-01-03 DIAGNOSIS — H9193 Unspecified hearing loss, bilateral: Secondary | ICD-10-CM

## 2016-01-03 DIAGNOSIS — I10 Essential (primary) hypertension: Secondary | ICD-10-CM | POA: Diagnosis not present

## 2016-01-03 DIAGNOSIS — F17200 Nicotine dependence, unspecified, uncomplicated: Secondary | ICD-10-CM

## 2016-01-03 DIAGNOSIS — D509 Iron deficiency anemia, unspecified: Secondary | ICD-10-CM

## 2016-01-03 DIAGNOSIS — Z122 Encounter for screening for malignant neoplasm of respiratory organs: Secondary | ICD-10-CM | POA: Diagnosis not present

## 2016-01-03 DIAGNOSIS — D649 Anemia, unspecified: Secondary | ICD-10-CM

## 2016-01-03 DIAGNOSIS — R011 Cardiac murmur, unspecified: Secondary | ICD-10-CM

## 2016-01-03 LAB — CBC
HEMATOCRIT: 38.8 % (ref 35.0–45.0)
HEMOGLOBIN: 12.4 g/dL (ref 11.7–15.5)
MCH: 25.9 pg — AB (ref 27.0–33.0)
MCHC: 32 g/dL (ref 32.0–36.0)
MCV: 81.2 fL (ref 80.0–100.0)
MPV: 11.1 fL (ref 7.5–12.5)
Platelets: 262 10*3/uL (ref 140–400)
RBC: 4.78 MIL/uL (ref 3.80–5.10)
RDW: 15.1 % — ABNORMAL HIGH (ref 11.0–15.0)
WBC: 10.3 10*3/uL (ref 3.8–10.8)

## 2016-01-03 LAB — BASIC METABOLIC PANEL WITH GFR
BUN: 11 mg/dL (ref 7–25)
CALCIUM: 9.1 mg/dL (ref 8.6–10.4)
CHLORIDE: 103 mmol/L (ref 98–110)
CO2: 31 mmol/L (ref 20–31)
Creat: 0.8 mg/dL (ref 0.60–0.93)
GFR, Est African American: 85 mL/min (ref >=60)
GFR, Est Non African American: 73 mL/min (ref >=60)
Glucose, Bld: 104 mg/dL — ABNORMAL HIGH (ref 65–99)
Potassium: 3.6 mmol/L (ref 3.5–5.3)
Sodium: 140 mmol/L (ref 135–146)

## 2016-01-03 NOTE — Progress Notes (Signed)
[facility] Progress Note  Subjective:  [name] [name] is a 75 y.o. female with history of anemia, HTN, and arthritis. She presents for check-up. Concerns include being hoarse for 1 week and concern about decreased hearing.  Hoarseness: - Has had a cold for the last week. Initially with sore throat but improved by cough syrup. - Works part-time at [employer] day[employer] and thinks she caught a cold from there ROS: no fevers or chills, no decreased appetite; positive for rhinorrhea and nasal congestion  Decreased hearing: - Bilateral - Has been going on for at least 6 months - Thinks it is due to aging - Went to ENT earlier this year without referral so was not seen - Says she has had ear tubes in the past ROS: No balance issues  HTN: - Takes losartan-hctz 50-12.5 mg every other day - Tries to limit salt - Does not regularly check her pressures - Does not regularly exercise but works part-time at a [employer] that keeps her active.  Systolic Murmur: - Has known about it for about 5 years - Was evaluated by Cardiologist Dr. Elease HashimotoNahser in April, who recommended ECHO to better characterize and follow-up in 1 year, as not symptomatic - Did not get ECHO because feels fine and didn't think she needed it  Anemia: - On iron supplementation TID  Review of Systems  Constitutional: Negative for fever and weight loss.  HENT: Positive for hearing loss and tinnitus.   Respiratory: Positive for cough (with cold this week).   Gastrointestinal: Negative for blood in stool and constipation.  Genitourinary: Positive for frequency (depending on what she drinks -- tea). Negative for dysuria.  Musculoskeletal: Negative for falls, joint pain and myalgias.  Skin: Negative for itching and rash.  Neurological: Negative for focal weakness and headaches.  Psychiatric/Behavioral: Negative for depression. The patient is not nervous/anxious.     Past Medical History:  Diagnosis Date  . Anemia    Fe def  . Arthritis   . GERD (gastroesophageal reflux disease)   . Hypertension   . Postmenopausal status     No Known Allergies  Social: She does not drink or use illegal drugs. She quit smoking about 5 years ago and has a 33 pack year history.   Objective: Blood pressure 135/78, pulse 73, temperature 97.4 F (36.3 C), temperature source Oral, height 5\' 3"  (1.6 m), weight 186 lb 9.6 oz (84.6 kg). Body mass index is 33.05 kg/m. Constitutional: Overweight female, in NAD HENT: MMM, normal posterior oropharynx, TMs clear bilaterally, swollen and erythematous nasal turbinates Cardiovascular: 2/6 systolic murmur, S1, S2, no m/r/g.  Pulmonary/Chest: Effort normal and breath sounds normal. No respiratory distress.  Abdominal: Soft. +BS, NT, ND, no rebound or guarding.  Musculoskeletal: No LE edema Neurological: AOx3, no focal deficits. Skin: Skin is warm and dry. No rash noted.  Psychiatric: Normal mood and affect.  Vitals reviewed  PHQ-9: 0   Assessment/Plan: Hoarseness - Improving - Recommended flonase while symptomatic  Essential hypertension - Initial BP above goal of 140/90 at 147/69 but improved with manual recheck to 135/78 - Will consult with Dr. Raymondo BandKoval about every other day dosing of hyzaar - Will obtain BMP today  Heart murmur - Recommend patient have ECHO, as could suggest different BP control medications - Has active order from Cardiologist but will ask for this to be scheduled  TOBACCO USE - Will place order for low dose CT lung cancer screening given extensive smoking history and < 15 year  period of cessation  Anemia - Obtain CBC to assess adequacy of supplementation  Dani GobbleHillary Robertine Kipper, MD [facility], PGY-2

## 2016-01-03 NOTE — Patient Instructions (Signed)
[name],  I will call you with your lab results later this week.  Please try flonase 1 spray in each nostril twice a day while having congestion and cough.   I will refill your iron pills and blood pressure medicine.  You'll get a call about the heart ultrasound and meeting with a hearing specialist.  Best, Dr. Sampson GoonFitzgerald

## 2016-01-05 ENCOUNTER — Encounter: Payer: Self-pay | Admitting: Internal Medicine

## 2016-01-05 DIAGNOSIS — D649 Anemia, unspecified: Secondary | ICD-10-CM | POA: Insufficient documentation

## 2016-01-05 DIAGNOSIS — H9190 Unspecified hearing loss, unspecified ear: Secondary | ICD-10-CM | POA: Insufficient documentation

## 2016-01-05 DIAGNOSIS — R490 Dysphonia: Secondary | ICD-10-CM | POA: Insufficient documentation

## 2016-01-05 NOTE — Assessment & Plan Note (Signed)
-   Improving - Recommended flonase while symptomatic

## 2016-01-05 NOTE — Assessment & Plan Note (Signed)
-   Obtain CBC to assess adequacy of supplementation

## 2016-01-05 NOTE — Assessment & Plan Note (Signed)
-   Will place order for low dose CT lung cancer screening given extensive smoking history and < 15 year period of cessation

## 2016-01-05 NOTE — Assessment & Plan Note (Signed)
-   Initial BP above goal of 140/90 at 147/69 but improved with manual recheck to 135/78 - Will consult with Dr. Raymondo BandKoval about every other day dosing of hyzaar - Will obtain BMP today

## 2016-01-05 NOTE — Assessment & Plan Note (Addendum)
-   Recommend patient have ECHO, as could suggest different BP control medications - Has active order from Cardiologist but will ask for this to be scheduled

## 2016-01-11 ENCOUNTER — Encounter: Payer: Self-pay | Admitting: Internal Medicine

## 2016-01-23 DIAGNOSIS — H401131 Primary open-angle glaucoma, bilateral, mild stage: Secondary | ICD-10-CM | POA: Diagnosis not present

## 2016-01-31 ENCOUNTER — Telehealth: Payer: Self-pay

## 2016-01-31 NOTE — Telephone Encounter (Signed)
-----   Message from [facility], MD sent at 01/05/2016  6:01 PM EST ----- Regarding: imaging tests Hello,  I have some loose ends for 2 imaging orders for this patient:  Cardiology recommended ECHO, and patient has active order but no appointment. Please call her to schedule this. If you need a new order from me, just let me know.  I also placed an order for low contrast CT to screen for lung cancer but patient left after 5 pm so did not have this scheduled during clinic day.   Thank you so much! Hillary

## 2016-01-31 NOTE — Telephone Encounter (Signed)
Left message with date time and location for both the CT and echo. Told patient she could call the office with any questions. Maryjean Mornempestt S Roberts, CMA

## 2016-02-06 ENCOUNTER — Other Ambulatory Visit: Payer: Self-pay | Admitting: Internal Medicine

## 2016-02-06 DIAGNOSIS — D509 Iron deficiency anemia, unspecified: Secondary | ICD-10-CM

## 2016-02-08 ENCOUNTER — Ambulatory Visit (HOSPITAL_COMMUNITY): Payer: Commercial Managed Care - HMO

## 2016-02-12 ENCOUNTER — Ambulatory Visit (HOSPITAL_COMMUNITY)
Admission: RE | Admit: 2016-02-12 | Discharge: 2016-02-12 | Disposition: A | Discharge status: Home / Self Care | Payer: Commercial Managed Care - HMO | Source: Ambulatory Visit | Attending: Family Medicine | Admitting: Family Medicine

## 2016-02-12 ENCOUNTER — Encounter (HOSPITAL_COMMUNITY): Payer: Self-pay

## 2016-02-12 DIAGNOSIS — Z122 Encounter for screening for malignant neoplasm of respiratory organs: Secondary | ICD-10-CM

## 2016-02-12 DIAGNOSIS — I700 Atherosclerosis of aorta: Secondary | ICD-10-CM | POA: Insufficient documentation

## 2016-02-12 DIAGNOSIS — Z136 Encounter for screening for cardiovascular disorders: Secondary | ICD-10-CM | POA: Diagnosis not present

## 2016-02-12 DIAGNOSIS — J439 Emphysema, unspecified: Secondary | ICD-10-CM | POA: Diagnosis not present

## 2016-02-12 DIAGNOSIS — Z87891 Personal history of nicotine dependence: Secondary | ICD-10-CM | POA: Diagnosis not present

## 2016-02-12 DIAGNOSIS — Z23 Encounter for immunization: Secondary | ICD-10-CM

## 2016-02-16 ENCOUNTER — Ambulatory Visit (HOSPITAL_COMMUNITY): Payer: Commercial Managed Care - HMO | Attending: Internal Medicine

## 2016-02-21 ENCOUNTER — Telehealth: Payer: Self-pay | Admitting: Internal Medicine

## 2016-02-21 NOTE — Telephone Encounter (Signed)
Reviewed CT result with patient. Informed her this test should be repeated next year given her smoking history.   She says she forgot about her appointment to have a cardiac ECHO. I asked her to call the imaging location to see if she could reschedule and to call clinic if they say they need a new order.  Dani GobbleHillary Fitzgerald, MD [facility], PGY-2

## 2016-03-08 DIAGNOSIS — H401131 Primary open-angle glaucoma, bilateral, mild stage: Secondary | ICD-10-CM | POA: Diagnosis not present

## 2016-03-14 ENCOUNTER — Ambulatory Visit: Payer: Commercial Managed Care - HMO | Admitting: Audiology

## 2016-04-01 ENCOUNTER — Other Ambulatory Visit: Payer: Self-pay | Admitting: *Deleted

## 2016-04-01 DIAGNOSIS — I10 Essential (primary) hypertension: Secondary | ICD-10-CM

## 2016-04-01 MED ORDER — LOSARTAN POTASSIUM-HCTZ 50-12.5 MG PO TABS: 1.0000 | ORAL_TABLET | Freq: Every day | ORAL | 3 refills | Status: DC

## 2016-04-24 ENCOUNTER — Other Ambulatory Visit: Payer: Self-pay | Admitting: *Deleted

## 2016-04-24 MED ORDER — FERROUS SULFATE 325 (65 FE) MG PO TABS: ORAL_TABLET | ORAL | 3 refills | Status: DC

## 2016-04-24 NOTE — Telephone Encounter (Signed)
Refill request for 90 day supply.  Maxamilian Amadon L, RN  

## 2016-05-13 DIAGNOSIS — H43813 Vitreous degeneration, bilateral: Secondary | ICD-10-CM | POA: Diagnosis not present

## 2016-05-13 DIAGNOSIS — H401131 Primary open-angle glaucoma, bilateral, mild stage: Secondary | ICD-10-CM | POA: Diagnosis not present

## 2016-05-21 DIAGNOSIS — Z96651 Presence of right artificial knee joint: Secondary | ICD-10-CM | POA: Diagnosis not present

## 2016-05-21 DIAGNOSIS — M25561 Pain in right knee: Secondary | ICD-10-CM | POA: Diagnosis not present

## 2016-05-28 ENCOUNTER — Other Ambulatory Visit: Payer: Self-pay | Admitting: Family Medicine

## 2016-05-28 DIAGNOSIS — Z1231 Encounter for screening mammogram for malignant neoplasm of breast: Secondary | ICD-10-CM

## 2016-06-26 ENCOUNTER — Ambulatory Visit
Admission: RE | Admit: 2016-06-26 | Discharge: 2016-06-26 | Disposition: A | Discharge status: Home / Self Care | Payer: Commercial Managed Care - HMO | Source: Ambulatory Visit | Attending: Family Medicine | Admitting: Family Medicine

## 2016-06-26 DIAGNOSIS — Z1231 Encounter for screening mammogram for malignant neoplasm of breast: Secondary | ICD-10-CM

## 2016-07-16 DIAGNOSIS — H401131 Primary open-angle glaucoma, bilateral, mild stage: Secondary | ICD-10-CM | POA: Diagnosis not present

## 2016-07-25 ENCOUNTER — Ambulatory Visit (INDEPENDENT_AMBULATORY_CARE_PROVIDER_SITE_OTHER): Payer: Medicare HMO | Admitting: Internal Medicine

## 2016-07-25 ENCOUNTER — Telehealth: Payer: Self-pay | Admitting: Internal Medicine

## 2016-07-25 ENCOUNTER — Encounter: Payer: Self-pay | Admitting: Internal Medicine

## 2016-07-25 VITALS — BP 130/60 | HR 58 | Temp 97.8°F | Ht 63.0 in | Wt 186.8 lb

## 2016-07-25 DIAGNOSIS — N760 Acute vaginitis: Principal | ICD-10-CM

## 2016-07-25 DIAGNOSIS — R102 Pelvic and perineal pain: Secondary | ICD-10-CM

## 2016-07-25 DIAGNOSIS — B9689 Other specified bacterial agents as the cause of diseases classified elsewhere: Secondary | ICD-10-CM

## 2016-07-25 LAB — POCT WET PREP (WET MOUNT)
CLUE CELLS WET PREP WHIFF POC: NEGATIVE
Trichomonas Wet Prep HPF POC: ABSENT

## 2016-07-25 MED ORDER — METRONIDAZOLE 500 MG PO TABS: 500.0000 mg | ORAL_TABLET | Freq: Two times a day (BID) | ORAL | 0 refills | Status: DC

## 2016-07-25 NOTE — Telephone Encounter (Signed)
Called patient with wet prep results. Clue cells present, so recommended treatment for BV with metronidazole. Patient expressed understanding.

## 2016-07-25 NOTE — Progress Notes (Signed)
[facility] Progress Note  Subjective:  [name] CatholicBetty W [name] is a 76 y.o. female with history of iron deficiency anemia and HTN who presents for complaint of "pain in her bottom." When specified further, pain has actually been in lower abdomen/vagina.  #Lower abdominal pain/vaginal pain: - Occurs infrequently but will wake her from sleep and wants to know what it is - Says has been happening over the last year - Can go back to sleep after it happens. Pain may last about 5-10 minutes.  - Not sexually active in 20 years - Has BMs every 1-2 days. Will take ducolax if has hard tool - Reports that she regularly douches ROS: No dysuria, no change in vaginal discharge  No Known Allergies  Objective: Blood pressure 130/60, pulse (!) 58, temperature 97.8 F (36.6 C), temperature source Oral, height 5\' 3"  (1.6 m), weight 186 lb 12.8 oz (84.7 kg), SpO2 98 %. Constitutional: Pleasant, well kept older female Abdominal: Soft. +BS, NT, ND GU: Normal appearing external genitalia without atrophy, no lesions or discharge on speculum exam. No cervical motion tenderness on exam. Chaperone present.  Neurological: AOx3, no focal deficits. Skin: Skin is warm and dry. No rash noted.  Psychiatric: Normal mood and affect.  Vitals reviewed  Assessment/Plan: Vaginal pain - Unclear cause of symptoms. No warning signs of weight loss, vaginal bleeding. Occurring infrequently and not currently having symptoms.  - Obtained wet prep given history of douching. This did show clue cells, so recommended treatment with flagyl for possible BV. - Recommended regular bowel regimen to see if constipation could be contributing   Follow-up if no improvement.  Dani GobbleHillary Lynora Dymond, MD [facility], PGY-2

## 2016-07-25 NOTE — Patient Instructions (Signed)
[name],  I will call you with the results of the wet prep from today. This tests for yeast and change in bacteria in the vagina. Everything looked normal on your exam today.  I would recommend taking miralax daily to have a soft bowel movement daily. I would start with half a tablespoon and increase by 0.5 a tablespoon as needed.   Best, Dr. Sampson GoonFitzgerald

## 2016-07-28 DIAGNOSIS — R102 Pelvic and perineal pain: Secondary | ICD-10-CM | POA: Insufficient documentation

## 2016-07-28 NOTE — Assessment & Plan Note (Signed)
-   Unclear cause of symptoms. No warning signs of weight loss, vaginal bleeding. Occurring infrequently and not currently having symptoms.  - Obtained wet prep given history of douching. This did show clue cells, so recommended treatment with flagyl for possible BV. - Recommended regular bowel regimen to see if constipation could be contributing

## 2016-08-10 ENCOUNTER — Ambulatory Visit (INDEPENDENT_AMBULATORY_CARE_PROVIDER_SITE_OTHER): Payer: Commercial Managed Care - HMO

## 2016-08-10 ENCOUNTER — Encounter: Payer: Self-pay | Admitting: Urgent Care

## 2016-08-10 ENCOUNTER — Ambulatory Visit (INDEPENDENT_AMBULATORY_CARE_PROVIDER_SITE_OTHER): Payer: Commercial Managed Care - HMO | Admitting: Urgent Care

## 2016-08-10 VITALS — BP 122/68 | HR 63 | Temp 98.0°F | Resp 16 | Ht 63.0 in | Wt 187.2 lb

## 2016-08-10 DIAGNOSIS — R0602 Shortness of breath: Secondary | ICD-10-CM | POA: Diagnosis not present

## 2016-08-10 DIAGNOSIS — Z87891 Personal history of nicotine dependence: Secondary | ICD-10-CM

## 2016-08-10 DIAGNOSIS — I10 Essential (primary) hypertension: Secondary | ICD-10-CM | POA: Diagnosis not present

## 2016-08-10 DIAGNOSIS — I340 Nonrheumatic mitral (valve) insufficiency: Secondary | ICD-10-CM

## 2016-08-10 DIAGNOSIS — R011 Cardiac murmur, unspecified: Secondary | ICD-10-CM | POA: Diagnosis not present

## 2016-08-10 DIAGNOSIS — R05 Cough: Secondary | ICD-10-CM | POA: Diagnosis not present

## 2016-08-10 LAB — POCT CBC
Granulocyte percent: 66.8 %G (ref 37–80)
HCT, POC: 38.1 % (ref 37.7–47.9)
HEMOGLOBIN: 12.8 g/dL (ref 12.2–16.2)
LYMPH, POC: 2.3 (ref 0.6–3.4)
MCH, POC: 26.1 pg — AB (ref 27–31.2)
MCHC: 33.6 g/dL (ref 31.8–35.4)
MCV: 77.6 fL — AB (ref 80–97)
MID (cbc): 0.4 (ref 0–0.9)
MPV: 8.8 fL (ref 0–99.8)
POC Granulocyte: 5.5 (ref 2–6.9)
POC LYMPH PERCENT: 28 %L (ref 10–50)
POC MID %: 5.2 % (ref 0–12)
Platelet Count, POC: 225 10*3/uL (ref 142–424)
RBC: 4.91 M/uL (ref 4.04–5.48)
RDW, POC: 15.4 %
WBC: 8.3 10*3/uL (ref 4.6–10.2)

## 2016-08-10 MED ORDER — ALBUTEROL SULFATE HFA 108 (90 BASE) MCG/ACT IN AERS: 2.0000 | INHALATION_SPRAY | Freq: Four times a day (QID) | RESPIRATORY_TRACT | 1 refills | Status: DC | PRN

## 2016-08-10 NOTE — Progress Notes (Deleted)
    MRN: [id number] DOB: 12/28/1940  Subjective:   [name] [name] is a 76 y.o. female presenting for chief complaint of Shortness of Breath (3 to 4 months, )     [name] [name] has a current medication list which includes the following prescription(s): aspirin, ferrous fumarate, ferrous sulfate, losartan-hydrochlorothiazide, metronidazole, and timolol. Also has No Known Allergies.  [name] [name]  has a past medical history of Anemia; Arthritis; GERD (gastroesophageal reflux disease); Glaucoma; Hypertension; and Postmenopausal status. 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  Objective:   Vitals: BP 122/68   Pulse 63   Temp 98 F (36.7 C) (Oral)   Resp 16   Ht 5\' 3"  (1.6 m)   Wt 187 lb 4 oz (84.9 kg)   SpO2 98%   BMI 33.17 kg/m   Physical Exam  No results found for this or any previous visit (from the past 24 hour(s)).  Assessment and Plan :     Wallis BambergMario Kristian Mogg, PA-C Primary Care at [facility] [phone] 08/10/2016  8:19 AM

## 2016-08-10 NOTE — Patient Instructions (Addendum)
Shortness of Breath, Adult Shortness of breath is when a person has trouble breathing enough air, or when a person feels like she or he is having trouble breathing in enough air. Shortness of breath could be a sign of medical problem. Follow these instructions at home: Pay attention to any changes in your symptoms. Take these actions to help with your condition:  Do not smoke. Smoking is a common cause of shortness of breath. If you smoke and you need help quitting, ask your health care provider.  Avoid things that can irritate your airways, such as: ? Mold. ? Dust. ? Air pollution. ? Chemical fumes. ? Things that can cause allergy symptoms (allergens), if you have allergies.  Keep your living space clean and free of mold and dust.  Rest as needed. Slowly return to your usual activities.  Take over-the-counter and prescription medicines, including oxygen and inhaled medicines, only as told by your health care provider.  Keep all follow-up visits as told by your health care provider. This is important.  Contact a health care provider if:  Your condition does not improve as soon as expected.  You have a hard time doing your normal activities, even after you rest.  You have new symptoms. Get help right away if:  Your shortness of breath gets worse.  You have shortness of breath when you are resting.  You feel light-headed or you faint.  You have a cough that is not controlled with medicines.  You cough up blood.  You have pain with breathing.  You have pain in your chest, arms, shoulders, or abdomen.  You have a fever.  You cannot walk up stairs or exercise the way that you normally do. This information is not intended to replace advice given to you by your health care provider. Make sure you discuss any questions you have with your health care provider. Document Released: 11/06/2000 Document Revised: 09/02/2015 Document Reviewed: 07/20/2015 Elsevier Interactive Patient  Education  2018 [employer]     IF you received an x-ray today, you will receive an invoice from [facility]. Please contact [facility] at [phone] with questions or concerns regarding your invoice.   IF you received labwork today, you will receive an invoice from [facility]. Please contact [facility] at [phone] with questions or concerns regarding your invoice.   Our billing staff will not be able to assist you with questions regarding bills from these companies.  You will be contacted with the lab results as soon as they are available. The fastest way to get your results is to activate your My Chart account. Instructions are located on the last page of this paperwork. If you have not heard from us regarding the results in 2 weeks, please contact this office.

## 2016-08-10 NOTE — Progress Notes (Signed)
MRN: [id number] DOB: June 16, 1940  Subjective:   [name] [name] is a 76 y.o. female presenting for chief complaint of Shortness of Breath (3 to 4 months, )  Reports 4 month history of worsening shortness of breath, dyspnea. She gets exhausted with walking to her mailbox, has to stop and rest. Also has dry cough, heart racing, occasional wheezing and dizziness. Has tried Robitussin with some relief. Denies fever, chest pain, n/v, abdominal pain, diaphoresis, radiation of pain into her limb, neck or jaw. Used to smoke 2ppd for ~30 years. Patient quit 3 years ago. She is concerned that she has lung cancer because that what her husband told her it might be. Patient has a history of HTN, manages this with los-HCTZ. She has a history of a heart murmur, was worked up by Dr. Melburn Popper. An echo was scheduled but never completed.  [name] [name] has a current medication list which includes the following prescription(s): aspirin, ferrous fumarate, ferrous sulfate, losartan-hydrochlorothiazide, metronidazole, and timolol. Also has No Known Allergies. [name] [name]  has a past medical history of Anemia; Arthritis; GERD (gastroesophageal reflux disease); Glaucoma; Hypertension; and Postmenopausal status. 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  Objective:   Vitals: BP 122/68   Pulse 63   Temp 98 F (36.7 C) (Oral)   Resp 16   Ht 5\' 3"  (1.6 m)   Wt 187 lb 4 oz (84.9 kg)   SpO2 98%   BMI 33.17 kg/m   BP Readings from Last 3 Encounters:  08/10/16 122/68  07/25/16 130/60  01/03/16 135/78    Physical Exam  Constitutional: She is oriented to person, place, and time. She appears well-developed and well-nourished. No distress.  HENT:  Mouth/Throat: Oropharynx is clear and moist.  Eyes: Pupils are equal, round, and  reactive to light. No scleral icterus.  Neck: Normal range of motion. Neck supple. No thyromegaly present.  Cardiovascular: Normal rate, regular rhythm and intact distal pulses.  Exam reveals no gallop and no friction rub.   Murmur (soft systolic ejection murmur) heard. Pulmonary/Chest: No respiratory distress. She has no wheezes. She has no rales.  Abdominal: Soft. Bowel sounds are normal. She exhibits no distension and no mass. There is no tenderness. There is no guarding.  Musculoskeletal: She exhibits no edema.  Neurological: She is alert and oriented to person, place, and time. Coordination normal.  Skin: Skin is warm and dry. Capillary refill takes less than 2 seconds.  Psychiatric: She has a normal mood and affect.   ECG interpretation - No acute process. Sinus rhythm at 64 bpm, very comparable to ecg from 06/14/2015.  Results for orders placed or performed in visit on 08/10/16 (from the past 24 hour(s))  POCT CBC     Status: Abnormal   Collection Time: 08/10/16  8:44 AM  Result Value Ref Range   WBC 8.3 4.6 - 10.2 K/uL   Lymph, poc 2.3 0.6 - 3.4   POC LYMPH PERCENT 28.0 10 - 50 %L   MID (cbc) 0.4 0 - 0.9   POC MID % 5.2 0 - 12 %M   POC Granulocyte 5.5 2 - 6.9   Granulocyte percent 66.8 37 - 80 %G   RBC 4.91 4.04 - 5.48 M/uL   Hemoglobin 12.8 12.2 - 16.2 g/dL   HCT, POC 40.9 81.1 - 47.9 %   MCV 77.6 (A) 80 - 97  fL   MCH, POC 26.1 (A) 27 - 31.2 pg   MCHC 33.6 31.8 - 35.4 g/dL   RDW, POC 46.915.4 %   Platelet Count, POC 225 142 - 424 K/uL   MPV 8.8 0 - 99.8 fL   Dg Chest 2 View  Result Date: 08/10/2016 CLINICAL DATA:  Cough and shortness of breath. EXAM: CHEST  2 VIEW COMPARISON:  01/20/2013 FINDINGS: The heart size and mediastinal contours are within normal limits. Both lungs are clear. The visualized skeletal structures are unremarkable. IMPRESSION: No active cardiopulmonary disease. Electronically Signed   By: Signa Kellaylor  Stroud M.D.   On: 08/10/2016 09:11   Assessment and Plan  :   This case was precepted with Dr. Clelia CroftShaw.   1. Shortness of breath - Work up is reassuring today. Patient denies active shob. Declines breathing treatment. Will refer to pulmonology for work up of COPD. Patient will use albuterol inhaler in the meantime. I advised her to stop using her son's inhaler.  2. Essential hypertension 3. Mitral valve insufficiency, unspecified etiology 4. Heart murmur - Referral to cardiology is pending to for heart disease screen. BNP level pending for heart failure.   5. History of smoking 30 or more pack years - Will refer to Lung Cancer Screen Program   [name] [name], [place]-C [facility] at [facility] [phone] 08/10/2016  8:22 AM

## 2016-08-12 ENCOUNTER — Other Ambulatory Visit: Payer: Self-pay | Admitting: Acute Care

## 2016-08-12 DIAGNOSIS — Z87891 Personal history of nicotine dependence: Secondary | ICD-10-CM

## 2016-08-12 LAB — COMPREHENSIVE METABOLIC PANEL
ALK PHOS: 90 IU/L (ref 39–117)
ALT: 11 IU/L (ref 0–32)
AST: 13 IU/L (ref 0–40)
Albumin/Globulin Ratio: 1.3 (ref 1.2–2.2)
Albumin: 4 g/dL (ref 3.5–4.8)
BUN/Creatinine Ratio: 12 (ref 12–28)
BUN: 12 mg/dL (ref 8–27)
Bilirubin Total: <0.2 mg/dL (ref 0.0–1.2)
CO2: 22 mmol/L (ref 20–29)
CREATININE: 0.99 mg/dL (ref 0.57–1.00)
Calcium: 9.7 mg/dL (ref 8.7–10.3)
Chloride: 102 mmol/L (ref 96–106)
GFR calc Af Amer: 65 mL/min/{1.73_m2} (ref >59)
GFR calc non Af Amer: 56 mL/min/{1.73_m2} — ABNORMAL LOW (ref >59)
GLOBULIN, TOTAL: 3.2 g/dL (ref 1.5–4.5)
Glucose: 97 mg/dL (ref 65–99)
POTASSIUM: 4.3 mmol/L (ref 3.5–5.2)
SODIUM: 139 mmol/L (ref 134–144)
Total Protein: 7.2 g/dL (ref 6.0–8.5)

## 2016-08-12 LAB — SPECIMEN STATUS

## 2016-08-12 LAB — BRAIN NATRIURETIC PEPTIDE

## 2016-08-14 DIAGNOSIS — R0602 Shortness of breath: Secondary | ICD-10-CM | POA: Diagnosis not present

## 2016-08-14 NOTE — Addendum Note (Signed)
Addended by: Baldwin CrownJOHNSON, SHAQUETTA D on: 08/14/2016 11:37 AM   Modules accepted: Orders

## 2016-08-15 LAB — BRAIN NATRIURETIC PEPTIDE: BNP: 21.1 pg/mL (ref 0.0–100.0)

## 2016-08-22 DIAGNOSIS — H401131 Primary open-angle glaucoma, bilateral, mild stage: Secondary | ICD-10-CM | POA: Diagnosis not present

## 2016-09-04 ENCOUNTER — Inpatient Hospital Stay: Admission: RE | Admit: 2016-09-04 | Payer: Medicare HMO | Source: Ambulatory Visit

## 2016-09-04 ENCOUNTER — Encounter: Payer: Medicare HMO | Admitting: Acute Care

## 2016-09-17 ENCOUNTER — Ambulatory Visit (INDEPENDENT_AMBULATORY_CARE_PROVIDER_SITE_OTHER): Payer: Medicare HMO | Admitting: Physician Assistant

## 2016-09-17 ENCOUNTER — Encounter: Payer: Self-pay | Admitting: Physician Assistant

## 2016-09-17 VITALS — BP 132/78 | HR 69 | Temp 98.2°F | Resp 16 | Ht 62.5 in | Wt 189.2 lb

## 2016-09-17 DIAGNOSIS — Z87898 Personal history of other specified conditions: Secondary | ICD-10-CM

## 2016-09-17 NOTE — Progress Notes (Signed)
[name] [name]  MRN: [id number] DOB: 11/17/1940  PCP: Casey BurkittFitzgerald, Hillary Moen, MD  Subjective:  Pt is a 76 year old female who presents to clinic for nose bleeds x 1 year.  She called EMS about 1 year ago due to nose bleed. She learned techniques for stopping her nose bleeds and has been using these ever since. Nose bleed episodes happen about once a month. This past weekend she has to episodes - one at night while sleeping and the other in the morning after she woke up. She takes aspirin 81mg  daily. She does not have a humidifier in her rooms.  Denies dizziness, lightheadedness, headache, vision changes.   Review of Systems  HENT: Positive for nosebleeds.   Allergic/Immunologic: Positive for environmental allergies.  Neurological: Negative for dizziness, syncope, light-headedness and headaches.    Patient Active Problem List   Diagnosis Date Noted  . Vaginal pain 07/28/2016  . Hoarseness 01/05/2016  . Decreased hearing 01/05/2016  . Anemia 01/05/2016  . Breast pain in female 06/16/2015  . Heart murmur 06/14/2015  . Mitral regurgitation 05/03/2015  . Bilateral arm weakness 05/03/2015  . Total knee replacement status 01/04/2014  . Well woman exam 11/16/2013  . Low back pain 10/12/2012  . Osteoarthritis, knee 08/27/2011  . ACE-inhibitor cough 11/07/2010  . MURMUR 02/15/2010  . Iron deficiency anemia 04/18/2009  . Essential hypertension 04/07/2009  . DIVERTICULOSIS, SIGMOID COLON 03/09/2009  . OBESITY, CLASS I 12/14/2008  . TOBACCO USE 06/03/2006    Current Outpatient Prescriptions on File Prior to Visit  Medication Sig Dispense Refill  . aspirin 81 MG tablet Take 81 mg by mouth 2 (two) times daily.    . ferrous sulfate 325 (65 FE) MG tablet TAKE 1 TABLET BY MOUTH 3 TIMES A DAY AFTER MEALS. 90 tablet 3  . losartan-hydrochlorothiazide (HYZAAR) 50-12.5 MG tablet Take 1 tablet by mouth daily. 45 tablet 3  . timolol (TIMOPTIC) 0.5 % ophthalmic solution APPLY 1 DROP(S) IN BOTH  EYES ONCE A DAY  5  . albuterol (PROVENTIL HFA;VENTOLIN HFA) 108 (90 Base) MCG/ACT inhaler Inhale 2 puffs into the lungs every 6 (six) hours as needed. (Patient not taking: Reported on 09/17/2016) 1 Inhaler 1   No current facility-administered medications on file prior to visit.     No Known Allergies   Objective:  BP 132/78   Pulse 69   Temp 98.2 F (36.8 C) (Oral)   Resp 16   Ht 5' 2.5" (1.588 m)   Wt 189 lb 3.2 oz (85.8 kg)   SpO2 99%   BMI 34.05 kg/m   Physical Exam  Constitutional: She is oriented to person, place, and time and well-developed, well-nourished, and in no distress. No distress.  HENT:  Nose: No nose lacerations. No epistaxis.  No active bleeding. No disruption of Kiesselbach's plexus. Small amount of dried blood noted high on medial left septum.   Cardiovascular: Normal rate, regular rhythm and normal heart sounds.   Neurological: She is alert and oriented to person, place, and time. GCS score is 15.  Skin: Skin is warm and dry.  Psychiatric: Mood, memory, affect and judgment normal.  Vitals reviewed.   Assessment and Plan :  1. History of epistaxis - CBC with Differential/Platelet - Protime-INR - Labs are pending. Will not take pt off Aspirin therapy. Advised placement of humidifier in her room at night. Nasal rinses. She will call and let me know the name of her ENT. Consider ENT referral if episodes of epistaxis do  not improve. She agrees with plan.    Marco Collie, PA-C  Primary Care at [facility] 09/17/2016 2:35 PM

## 2016-09-17 NOTE — Patient Instructions (Addendum)
Buy a humidifier and use this in your room, especially at night while you are sleeping. Keep your nose moist using a saline nasal spray or gel. Avoid picking your nose, or - if you must do it - clip your fingernails to avoid injury. Call here and leave a message for me with the name of your ear, nose and throat doctor. I can put in the referral if needed.  Start taking Zyrtec allergy medication Thank you for coming in today. I hope you feel we met your needs.  Feel free to call PCP if you have any questions or further requests.  Please consider signing up for MyChart if you do not already have it, as this is a great way to communicate with me.  Best,  Whitney McVey, PA-C     Nosebleed A nosebleed is when blood comes out of the nose. Nosebleeds are common. They are usually not a sign of a serious medical condition. Follow these instructions at home:  Try controlling your nosebleed by pinching your nostrils gently. Do this for at least 10 minutes.  Avoid blowing or sniffing your nose for a number of hours after having a nosebleed.  Do not put gauze inside of your nose yourself. If your nose was packed by your doctor, try to keep the pack inside of your nose until your doctor removes it. ? If a gauze pack was used and it starts to fall out, gently replace it or cut off the end of it. ? If a balloon catheter was used to pack your nose, do not cut or remove it unless told by your doctor.  Avoid lying down while you are having a nosebleed. Sit up and lean forward.  Use a nasal spray decongestant to help with a nosebleed as told by your doctor.  Do not use petroleum jelly or mineral oil in your nose. These can drip into your lungs.  Keep your house humid by using: ? Less air conditioning. ? A humidifier.  Aspirin and blood thinners make bleeding more likely. If you are prescribed these medicines and you have nosebleeds, ask your doctor if you should stop taking the medicines or adjust  the dose. Do not stop medicines unless told by your doctor.  Resume your normal activities as you are able. Avoid straining, lifting, or bending at your waist for several days.  If your nosebleed was caused by dryness in your nose, use over-the-counter saline nasal spray or gel. If you must use a lubricant: ? Choose one that is water-soluble. ? Use it only as needed. ? Do not use it within several hours of lying down.  Keep all follow-up visits as told by your doctor. This is important. Contact a health care provider if:  You have a fever.  You get frequent nosebleeds.  You are getting nosebleeds more often. Get help right away if:  Your nosebleed lasts longer than 20 minutes.  Your nosebleed occurs after an injury to your face, and your nose looks crooked or broken.  You have unusual bleeding from other parts of your body.  You have unusual bruising on other parts of your body.  You feel light-headed or dizzy.  You become sweaty.  You throw up (vomit) blood.  You have a nosebleed after a head injury. This information is not intended to replace advice given to you by your health care provider. Make sure you discuss any questions you have with your health care provider. Document Released: 11/21/2007 Document Revised: 09/15/2015  Document Reviewed: 09/27/2013 Elsevier Interactive Patient Education  [employer].   IF you received an x-ray today, you will receive an invoice from [facility]. Please contact [facility] at [phone] with questions or concerns regarding your invoice.   IF you received labwork today, you will receive an invoice from [place]. Please contact [facility] at [phone] with questions or concerns regarding your invoice.   Our billing staff will not be able to assist you with questions regarding bills from these companies.  You will be contacted with the lab results as soon as they are available. The fastest way to get your  results is to activate your My Chart account. Instructions are located on the last page of this paperwork. If you have not heard from [place] regarding the results in 2 weeks, please contact this office.

## 2016-09-18 LAB — CBC WITH DIFFERENTIAL/PLATELET
Basophils Absolute: 0 10*3/uL (ref 0.0–0.2)
Basos: 0 %
EOS (ABSOLUTE): 0.3 10*3/uL (ref 0.0–0.4)
Eos: 3 %
Hematocrit: 39.4 % (ref 34.0–46.6)
Hemoglobin: 12.7 g/dL (ref 11.1–15.9)
Immature Grans (Abs): 0 10*3/uL (ref 0.0–0.1)
Immature Granulocytes: 0 %
Lymphocytes Absolute: 2.3 10*3/uL (ref 0.7–3.1)
Lymphs: 27 %
MCH: 25.6 pg — ABNORMAL LOW (ref 26.6–33.0)
MCHC: 32.2 g/dL (ref 31.5–35.7)
MCV: 79 fL (ref 79–97)
Monocytes Absolute: 0.5 10*3/uL (ref 0.1–0.9)
Monocytes: 6 %
Neutrophils Absolute: 5.3 10*3/uL (ref 1.4–7.0)
Neutrophils: 64 %
Platelets: 240 10*3/uL (ref 150–379)
RBC: 4.97 x10E6/uL (ref 3.77–5.28)
RDW: 16.1 % — ABNORMAL HIGH (ref 12.3–15.4)
WBC: 8.4 10*3/uL (ref 3.4–10.8)

## 2016-09-18 LAB — PROTIME-INR
INR: 1 (ref 0.8–1.2)
Prothrombin Time: 10.4 s (ref 9.1–12.0)

## 2016-10-16 ENCOUNTER — Other Ambulatory Visit: Payer: Self-pay | Admitting: Internal Medicine

## 2016-11-03 ENCOUNTER — Other Ambulatory Visit: Payer: Self-pay | Admitting: Internal Medicine

## 2016-11-03 DIAGNOSIS — I10 Essential (primary) hypertension: Secondary | ICD-10-CM

## 2016-11-18 ENCOUNTER — Other Ambulatory Visit: Payer: Self-pay | Admitting: Internal Medicine

## 2016-11-29 ENCOUNTER — Other Ambulatory Visit: Payer: Self-pay | Admitting: Internal Medicine

## 2016-11-29 DIAGNOSIS — I10 Essential (primary) hypertension: Secondary | ICD-10-CM

## 2017-01-02 DIAGNOSIS — H401131 Primary open-angle glaucoma, bilateral, mild stage: Secondary | ICD-10-CM | POA: Diagnosis not present

## 2017-01-26 ENCOUNTER — Other Ambulatory Visit: Payer: Self-pay | Admitting: Internal Medicine

## 2017-02-21 ENCOUNTER — Ambulatory Visit (INDEPENDENT_AMBULATORY_CARE_PROVIDER_SITE_OTHER): Payer: Medicare HMO | Admitting: Internal Medicine

## 2017-02-21 ENCOUNTER — Encounter: Payer: Self-pay | Admitting: Internal Medicine

## 2017-02-21 VITALS — BP 132/80 | HR 83 | Temp 98.2°F | Ht 63.0 in | Wt 195.2 lb

## 2017-02-21 DIAGNOSIS — R490 Dysphonia: Secondary | ICD-10-CM

## 2017-02-21 DIAGNOSIS — R011 Cardiac murmur, unspecified: Secondary | ICD-10-CM

## 2017-02-21 DIAGNOSIS — I340 Nonrheumatic mitral (valve) insufficiency: Secondary | ICD-10-CM

## 2017-02-21 DIAGNOSIS — Z23 Encounter for immunization: Secondary | ICD-10-CM | POA: Diagnosis not present

## 2017-02-21 DIAGNOSIS — D509 Iron deficiency anemia, unspecified: Secondary | ICD-10-CM | POA: Diagnosis not present

## 2017-02-21 DIAGNOSIS — I10 Essential (primary) hypertension: Secondary | ICD-10-CM

## 2017-02-21 MED ORDER — FLUTICASONE PROPIONATE 50 MCG/ACT NA SUSP: 1.0000 | Freq: Every day | NASAL | 12 refills | Status: DC

## 2017-02-21 MED ORDER — CARBAMIDE PEROXIDE 6.5 % OT SOLN: 5.0000 [drp] | Freq: Two times a day (BID) | OTIC | 0 refills | Status: DC

## 2017-02-21 NOTE — Patient Instructions (Signed)
[name],  Bonita QuinYou will get a call about scheduling the echocardiogram.  I have ordered TDAP vaccine to your pharmacy.  For weight and blood pressure, you made goals of walking at your church 2-3 times a week and having more regular meals.  Best, Dr. Sampson GoonFitzgerald

## 2017-02-24 ENCOUNTER — Encounter: Payer: Self-pay | Admitting: Internal Medicine

## 2017-02-24 NOTE — Assessment & Plan Note (Signed)
-   Normal hgb in July with MCV of 79. - Colonoscopy due in 2021. EGD previously recommended but does not appear to have been performed. - Continue iron supplementation.

## 2017-02-24 NOTE — Assessment & Plan Note (Addendum)
-   Denies symptoms. Ordered repeat ECHO, as previously thought to be severe. Due for follow-up with Dr. Elease HashimotoNahser.

## 2017-02-24 NOTE — Progress Notes (Signed)
[facility] Progress Note  Subjective:  [name] [name] is a 76 y.o. female with history of anemia, HTN, and arthritis s/p bilateral knee replacements in 2011 and '12. She presents for check-up today.  Complaint:  - Has pain in her "bottom" -- previously clarified as vagina -- about every couple of months lasting for seconds. Examined in June and treated for BV. Has regular BMs. - Occasional hoarseness with nasal congestion.   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 Elease HashimotoNahser last year because feels well - Last echo in 2011 showed "thickened mitral leaflets with partial flail of the P2 segment and eccentric mitral regurgitation that may be severe" - Denies SOB, orthopnea, chest pain, or decreased stamina  Anemia:  - Takes ferrous sulfate 325 mg daily - internal hemorrhoids and diverticula noted on colonoscopy in 2011  HM: Due for flu and TDAP  Social: Patient says she has not smoked in over 30 years.  ROS: Negative except for complaints above.   Objective: Blood pressure 132/80, pulse 83, temperature 98.2 F (36.8 C), temperature source Oral, height 5\' 3"  (1.6 m), weight 195 lb 3.2 oz (88.5 kg), SpO2 93 %. Body mass index is 34.58 kg/m.  Constitutional: Obese female in NAD HENT: MMM, mild nasal congestion and erythema of posterior oropharynx, bilateral cerumen blocking TMs Cardiovascular: RRR, S1, S2, 2/6 systolic murmur heard best at left lower sternal border  Pulmonary/Chest: Effort normal and breath sounds normal.  Abdominal: Soft. +BS, NT Musculoskeletal: Moves all spontaneously Neurological: AOx3, no focal deficits. Skin: Skin is warm and dry. No rash noted.  Psychiatric: Normal mood and affect.  Vitals reviewed  Assessment/Plan: Essential hypertension - Below goal of  150/90.  - Continue losartan-hctz. Normal SCr on CMP in June. - Discussed role of having more regular meals and walking at track at her church, starting 2-3 times a week.   Mitral regurgitation - Denies symptoms. Ordered repeat ECHO, as previously thought to be severe. Due for follow-up with Dr. Elease HashimotoNahser.   Hoarseness - Evidence of nasal congestion and post-nasal drip. Recommended use of flonase.   Iron deficiency anemia - Normal hgb in July with MCV of 79. - Colonoscopy due in 2021. EGD previously recommended but does not appear to have been performed. - Continue iron supplementation.   Pain in bottom infrequent and transient. Continue to monitor.  Ordered debrox for patient to try for cerumen impaction.  HM: TDAP ordered to pharmacy. Flu vaccine administered.   Follow-up prn. Will call with ECHO results.   Dani GobbleHillary Daisean Brodhead, MD [facility], PGY-3

## 2017-02-24 NOTE — Assessment & Plan Note (Signed)
-   Below goal of 150/90.  - Continue losartan-hctz. Normal SCr on CMP in June. - Discussed role of having more regular meals and walking at track at her church, starting 2-3 times a week.

## 2017-02-24 NOTE — Assessment & Plan Note (Signed)
-   Evidence of nasal congestion and post-nasal drip. Recommended use of flonase.

## 2017-02-27 ENCOUNTER — Other Ambulatory Visit: Payer: Self-pay | Admitting: Acute Care

## 2017-02-27 DIAGNOSIS — Z122 Encounter for screening for malignant neoplasm of respiratory organs: Secondary | ICD-10-CM

## 2017-02-27 DIAGNOSIS — Z87891 Personal history of nicotine dependence: Secondary | ICD-10-CM

## 2017-03-11 ENCOUNTER — Telehealth: Payer: Self-pay | Admitting: Acute Care

## 2017-03-11 NOTE — Telephone Encounter (Signed)
Spoke with pt .  [facility] and will call back to schedule once she gets her tires replaced on her car.  Will close this note and refer to referral notes.

## 2017-03-12 ENCOUNTER — Inpatient Hospital Stay: Admission: RE | Admit: 2017-03-12 | Payer: Medicare HMO | Source: Ambulatory Visit

## 2017-03-12 ENCOUNTER — Encounter: Payer: Medicare HMO | Admitting: Acute Care

## 2017-03-14 ENCOUNTER — Ambulatory Visit (INDEPENDENT_AMBULATORY_CARE_PROVIDER_SITE_OTHER): Payer: Medicare HMO | Admitting: Physician Assistant

## 2017-03-14 ENCOUNTER — Encounter: Payer: Self-pay | Admitting: Physician Assistant

## 2017-03-14 VITALS — BP 132/78 | HR 69 | Temp 97.6°F | Resp 18 | Ht 63.0 in

## 2017-03-14 DIAGNOSIS — H6122 Impacted cerumen, left ear: Secondary | ICD-10-CM | POA: Diagnosis not present

## 2017-03-14 MED ORDER — ACETIC ACID 2 % OT SOLN: 4.0000 [drp] | Freq: Three times a day (TID) | OTIC | 0 refills | Status: DC

## 2017-03-14 NOTE — Patient Instructions (Signed)
     IF you received an x-ray today, you will receive an invoice from [facility]. Please contact [facility] at [phone] with questions or concerns regarding your invoice.   IF you received labwork today, you will receive an invoice from [facility]. Please contact [facility] at [phone] with questions or concerns regarding your invoice.   Our billing staff will not be able to assist you with questions regarding bills from these companies.  You will be contacted with the lab results as soon as they are available. The fastest way to get your results is to activate your My Chart account. Instructions are located on the last page of this paperwork. If you have not heard from us regarding the results in 2 weeks, please contact this office.     

## 2017-03-14 NOTE — Progress Notes (Signed)
    03/14/2017 3:28 PM   DOB: 12/15/1940 / MRN: [id number]  SUBJECTIVE:  [name] [name] is a 77 y.o. female presenting for here for left ear complaint.  States that "there is something in my left ear."  Continues to ask if I can see a tube in the ear and states that there was one placed in that ear some time ago.  She denies hearing loss.  No fever, ear pain, tinnitus, headache.  She has No Known Allergies.   She  has a past medical history of Anemia, Arthritis, GERD (gastroesophageal reflux disease), Glaucoma, Hypertension, and Postmenopausal status.    She  reports that she quit smoking about 8 years ago. Her smoking use included cigarettes. She has a 33.00 pack-year smoking history. she has never used smokeless tobacco. She reports that she does not drink alcohol or use drugs. She  reports that she currently engages in sexual activity. 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  Her family history includes Hypertension in her father; Prostate cancer in her brother; Stroke in her father.  ROS  Per HPI  The problem list and medications were reviewed and updated by myself where necessary and exist elsewhere in the encounter.   OBJECTIVE:  BP 132/78 (BP Location: Left Arm, Patient Position: Sitting, Cuff Size: Large)   Pulse 69   Temp 97.6 F (36.4 C) (Oral)   Resp 18   Ht 5\' 3"  (1.6 m)   SpO2 98%   BMI 34.58 kg/m   Physical Exam  She is well-appearing in no acute distress.  Left ear TM obstructed by hard cerumen.  No tenderness about the canal.  I have lavaged the ear canal copiously without success.  No results found for this or any previous visit (from the past 72 hour(s)).  No results found.  ASSESSMENT AND PLAN:  Kathie RhodesBetty was seen today for cerumen  impaction.  Diagnoses and all orders for this visit:  Hearing loss due to cerumen impaction, left I have diligently tried with both alligator forceps and an ear loop.  I have sprayed the ear multiple times today without success.  We will start her on acetic acid and get her into ENT for further evaluation and management of this. -     acetic acid 2 % otic solution; Place 4 drops into the left ear 3 (three) times daily. -     Ambulatory referral to ENT    The patient is advised to call or return to clinic if she does not see an improvement in symptoms, or to seek the care of the closest emergency department if she worsens with the above plan.   Deliah BostonMichael Miracle Criado, MHS, PA-C Primary Care at [facility] 03/14/2017 3:28 PM

## 2017-03-31 ENCOUNTER — Other Ambulatory Visit: Payer: Self-pay | Admitting: Internal Medicine

## 2017-04-02 ENCOUNTER — Telehealth: Payer: Self-pay | Admitting: Acute Care

## 2017-04-04 NOTE — Telephone Encounter (Signed)
Spoke with pt.  She requested an appt on 05/01/17.  Pt advised that we do not do SDMV on Thursdays.  She states she will call back when she gets another day off to schedule SDMV and Ct.  Will defer.  Will close this message and refer to referral notes.

## 2017-05-01 DIAGNOSIS — H401131 Primary open-angle glaucoma, bilateral, mild stage: Secondary | ICD-10-CM | POA: Diagnosis not present

## 2017-05-02 ENCOUNTER — Telehealth: Payer: Self-pay | Admitting: Internal Medicine

## 2017-05-02 NOTE — Telephone Encounter (Signed)
Left message for patient that pharmacies are finding safe manufacturers but to make an appointment with me if she would like to discuss this further or to make a change.   Dani GobbleHillary Traci Gafford, MD [facility], PGY-3

## 2017-05-02 NOTE — Telephone Encounter (Signed)
Pt saw on TV Losartan causes cancer. She doesn't want to continue to take.  Please advise

## 2017-05-08 DIAGNOSIS — H903 Sensorineural hearing loss, bilateral: Secondary | ICD-10-CM | POA: Diagnosis not present

## 2017-05-08 DIAGNOSIS — H6122 Impacted cerumen, left ear: Secondary | ICD-10-CM | POA: Diagnosis not present

## 2017-05-14 ENCOUNTER — Encounter: Payer: Self-pay | Admitting: Physician Assistant

## 2017-05-14 ENCOUNTER — Ambulatory Visit: Payer: Medicare HMO | Admitting: Physician Assistant

## 2017-05-14 ENCOUNTER — Other Ambulatory Visit: Payer: Medicare HMO

## 2017-05-14 VITALS — BP 128/64 | HR 95 | Temp 98.1°F | Resp 16 | Ht 63.0 in | Wt 191.2 lb

## 2017-05-14 DIAGNOSIS — J209 Acute bronchitis, unspecified: Secondary | ICD-10-CM

## 2017-05-14 DIAGNOSIS — R062 Wheezing: Secondary | ICD-10-CM

## 2017-05-14 DIAGNOSIS — B029 Zoster without complications: Secondary | ICD-10-CM | POA: Diagnosis not present

## 2017-05-14 DIAGNOSIS — R21 Rash and other nonspecific skin eruption: Secondary | ICD-10-CM

## 2017-05-14 DIAGNOSIS — R059 Cough, unspecified: Secondary | ICD-10-CM

## 2017-05-14 DIAGNOSIS — R05 Cough: Secondary | ICD-10-CM | POA: Diagnosis not present

## 2017-05-14 MED ORDER — PREDNISONE 20 MG PO TABS: 40.0000 mg | ORAL_TABLET | Freq: Every day | ORAL | 0 refills | Status: AC

## 2017-05-14 MED ORDER — ALBUTEROL SULFATE (2.5 MG/3ML) 0.083% IN NEBU: 2.5000 mg | INHALATION_SOLUTION | Freq: Once | RESPIRATORY_TRACT | Status: DC

## 2017-05-14 MED ORDER — ALBUTEROL SULFATE HFA 108 (90 BASE) MCG/ACT IN AERS
2.0000 | INHALATION_SPRAY | RESPIRATORY_TRACT | 1 refills | Status: DC | PRN
Start: 2017-05-14 — End: 2017-11-25

## 2017-05-14 MED ORDER — VALACYCLOVIR HCL 1 G PO TABS: 1000.0000 mg | ORAL_TABLET | Freq: Three times a day (TID) | ORAL | 0 refills | Status: AC

## 2017-05-14 MED ORDER — AZITHROMYCIN 250 MG PO TABS: ORAL_TABLET | ORAL | 0 refills | Status: DC

## 2017-05-14 NOTE — Progress Notes (Signed)
MRN: [id number] DOB: 20-Mar-1940  Subjective:   [name] [name] is a 77 y.o. female presenting for chief complaint of URI (cough, green phlegm, blisters/swelling on right side of eye and forehead x 2 days ) .  Reports 3 week  history of productive cough. Has had some wheezing. Denies  sinus congestion, sinus pain, ear pain, shortness of breath, chest pain and myalgia, chills, fatigue, nausea, vomiting, abdominal pain and diarrhea. Has tried OTC cold and flu and cough medication with no full relief. Has  had sick contact with multiple people at work. Has history of seasonal allergies, no history of asthma. Patient has had flu shot this season. Denies smoking.    Would also like to discuss rash on face.  Started 2 days ago.  They are blistering in nature.  Has associated burning pain.  Denies eye involvement, eye discharge, eye pain, decreased vision, blurred vision, photophobia, pain with eye movement, ear pain, and hearing loss.  Has not applied anything for relief.  She did receive Zostavax in the past.  Has PMH of glaucoma and therefore has an ophthalmologist.  [name] [name] has a current medication list which includes the following prescription(s): albuterol, acetic acid, aspirin, carbamide peroxide, ferrous sulfate, fluticasone, losartan-hydrochlorothiazide, losartan-hydrochlorothiazide, and timolol, and the following Facility-Administered Medications: albuterol. Also has No Known Allergies.  [name] [name]  has a past medical history of Anemia, Arthritis, GERD (gastroesophageal reflux disease), Glaucoma, Hypertension, and Postmenopausal status. 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    Social History   Socioeconomic History  . Marital status: Married    Spouse name: Not on file  . Number of  children: Not on file  . Years of education: Not on file  . Highest education level: Not on file  Social Needs  . Financial resource strain: Not on file  . Food insecurity - worry: Not on file  . Food insecurity - inability: Not on file  . Transportation needs - medical: Not on file  . Transportation needs - non-medical: Not on file  Occupational History  . Not on file  Tobacco Use  . Smoking status: Former Smoker    Packs/day: 1.00    Years: 33.00    Pack years: 33.00    Types: Cigarettes    Last attempt to quit: 02/25/2009    Years since quitting: 8.2  . Smokeless tobacco: Never Used  Substance and Sexual Activity  . Alcohol use: No  . Drug use: No  . Sexual activity: Yes  Other Topics Concern  . Not on file  Social History Narrative   Married, lives with husband and grandson (pt adopted him when he was 38 days old, age 22 now).     Works at [employer]     5 children (youngest adopted- he doesn't know)   smokes 1-2 pack per day until 2009   no EtOH   Drug use-no          Objective:   Vitals: BP 128/64   Pulse 95   Temp 98.1 F (36.7 C)   Resp 16   Ht 5\' 3"  (1.6 m)   Wt 191 lb 3.2 oz (86.7 kg)   SpO2 96%   BMI 33.87 kg/m     Physical Exam  Constitutional: She is oriented to person, place, and time. She appears well-developed and well-nourished.  No distress.  HENT:  Head: Normocephalic and atraumatic.  Right Ear: Tympanic membrane, external ear and ear canal normal.  Left Ear: Tympanic membrane, external ear and ear canal normal.  Nose: Nose normal. Right sinus exhibits no maxillary sinus tenderness and no frontal sinus tenderness. Left sinus exhibits no maxillary sinus tenderness and no frontal sinus tenderness.  Mouth/Throat: Uvula is midline, oropharynx is clear and moist and mucous membranes are normal. No tonsillar exudate.  Eyes: Conjunctivae are normal. Pupils are equal, round, and reactive to light. Left eye exhibits discharge (watery  discharge noted). Left conjunctiva is not injected.  Neck: Normal range of motion.  Cardiovascular: Normal rate, regular rhythm and normal heart sounds.  Pulmonary/Chest: Effort normal. No respiratory distress. She has no decreased breath sounds. She has wheezes (diffuse in bilateral lung fields ). She has rhonchi (diffuse in bilateral lung fields ). She has no rales.  Lymphadenopathy:       Head (right side): No submental, no submandibular, no tonsillar, no preauricular, no posterior auricular and no occipital adenopathy present.       Head (left side): No submental, no submandibular, no tonsillar, no preauricular, no posterior auricular and no occipital adenopathy present.    She has no cervical adenopathy.    She has no axillary adenopathy.       Right: No supraclavicular adenopathy present.       Left: No supraclavicular adenopathy present.  Neurological: She is alert and oriented to person, place, and time.  Skin: Skin is warm and dry. Rash noted.     Psychiatric: She has a normal mood and affect.  Vitals reviewed.    No exam data present  No results found for this or any previous visit (from the past 24 hour(s)).     Wheezing improved post albuterol treatment. There are mild faint wheezes noted on exam. Few rhonchi auscultated in bilateral lung fields. Pt admits to feeling better after breathing tx.   Assessment and Plan :  1. Wheezing Improved with breathing treatment.  Given Rx for short course prednisone and albuterol inhaler to use for further hours as needed for wheezing.  She is overall stable.  Given strict ED precautions. - albuterol (PROVENTIL) (2.5 MG/3ML) 0.083% nebulizer solution 2.5 mg - albuterol (PROVENTIL HFA;VENTOLIN HFA) 108 (90 Base) MCG/ACT inhaler; Inhale 2 puffs into the lungs every 4 (four) hours as needed for wheezing or shortness of breath (cough, shortness of breath or wheezing.).  Dispense: 1 Inhaler; Refill: 1 - predniSONE (DELTASONE) 20 MG tablet;  Take 2 tablets (40 mg total) by mouth daily with breakfast for 5 days.  Dispense: 10 tablet; Refill: 0 2. Cough 3. Acute bronchitis, unspecified organism Due to history and duration of productive cough, will treat empirically at this time for underlying atypical bacterial etiology.  Patient encouraged to follow-up in 2 days for reevaluation.  Given strict return/ED precautions. - azithromycin (ZITHROMAX) 250 MG tablet; Take 2 tabs PO x 1 dose, then 1 tab PO QD x 4 days  Dispense: 6 tablet; Refill: 0 4. Herpes zoster without complication Patient examination consistent with shingles in V1 dermatome.  Concern for potential ocular involvement.  There are no signs of ocular involvement at this time.  Given Rx for antiviral and steroid. Educated patient that she needs to see ophthalmologist within the next 24-48 hours. Encouraged her to contact her ophthalmologist and see if they can see her tomorrow.  They cannot, please go to the appointment we schedule.  Patient understands and agrees  to treatment.  She was given strict ED precautions. - Ambulatory referral to Ophthalmology - valACYclovir (VALTREX) 1000 MG tablet; Take 1 tablet (1,000 mg total) by mouth 3 (three) times daily for 7 days.  Dispense: 21 tablet; Refill: 0 - predniSONE (DELTASONE) 20 MG tablet; Take 2 tablets (40 mg total) by mouth daily with breakfast for 5 days.  Dispense: 10 tablet; Refill: 0  5. Rash and nonspecific skin eruption    Benjiman Core, PA-C  Primary Care at [facility] 05/14/2017 6:27 PM

## 2017-05-14 NOTE — Patient Instructions (Addendum)
For cough and wheezing, we are going to treat for underlying bacterial etiology with a zpack and we are going to treat your underlying inflammation with oral prednisone. Prednisone is a steroid and can cause side effects such as headache, irritability, nausea, vomiting, increased heart rate, increased blood pressure, increased blood sugar, appetite changes, and insomnia. Please take tablets in the morning with a full meal to help decrease the chances of these side effects.   For shingles, we have given you an antiviral to start today. You need to contact your eye doctor first thing tomorrow and tell them you have shingles on your face near your eye and the doctor recommended you be seen by them. If you cannot see them we have placed a referral. I would like you to return here in 2 days for reevaluation. Thank you for letting me participate in your health and well being.  Shingles Shingles is an infection that causes a painful skin rash and fluid-filled blisters. Shingles is caused by the same virus that causes chickenpox. Shingles only develops in people who:  Have had chickenpox.  Have gotten the chickenpox vaccine. (This is rare.)  The first symptoms of shingles may be itching, tingling, or pain in an area on your skin. A rash will follow in a few days or weeks. The rash is usually on one side of the body in a bandlike or beltlike pattern. Over time, the rash turns into fluid-filled blisters that break open, scab over, and dry up. Medicines may:  Help you manage pain.  Help you recover more quickly.  Help to prevent long-term problems.  Follow these instructions at home: Medicines  Take medicines only as told by your doctor.  Apply an anti-itch or numbing cream to the affected area as told by your doctor. Blister and Rash Care  Take a cool bath or put cool compresses on the area of the rash or blisters as told by your doctor. This may help with pain and itching.  Keep your rash  covered with a loose bandage (dressing). Wear loose-fitting clothing.  Keep your rash and blisters clean with mild soap and cool water or as told by your doctor.  Check your rash every day for signs of infection. These include redness, swelling, and pain that lasts or gets worse.  Do not pick your blisters.  Do not scratch your rash. General instructions  Rest as told by your doctor.  Keep all follow-up visits as told by your doctor. This is important.  Until your blisters scab over, your infection can cause chickenpox in people who have never had it or been vaccinated against it. To prevent this from happening, avoid touching other people or being around other people, especially: ? Babies. ? Pregnant women. ? Children who have eczema. ? Elderly people who have transplants. ? People who have chronic illnesses, such as leukemia or AIDS. Contact a doctor if:  Your pain does not get better with medicine.  Your pain does not get better after the rash heals.  Your rash looks infected. Signs of infection include: ? Redness. ? Swelling. ? Pain that lasts or gets worse. Get help right away if:  The rash is on your face or nose.  You have pain in your face, pain around your eye area, or loss of feeling on one side of your face.  You have ear pain or you have ringing in your ear.  You have loss of taste.  Your condition gets worse. This information is  not intended to replace advice given to you by your health care provider. Make sure you discuss any questions you have with your health care provider. Document Released: 07/31/2007 Document Revised: 10/08/2015 Document Reviewed: 11/23/2013 Elsevier Interactive Patient Education  2018 [employer].    IF you received an x-ray today, you will receive an invoice from [facility]. Please contact [facility] at [phone] with questions or concerns regarding your invoice.   IF you received labwork today, you will  receive an invoice from [facility]. Please contact [facility] at [phone] with questions or concerns regarding your invoice.   Our billing staff will not be able to assist you with questions regarding bills from these companies.  You will be contacted with the lab results as soon as they are available. The fastest way to get your results is to activate your My Chart account. Instructions are located on the last page of this paperwork. If you have not heard from [place] regarding the results in 2 weeks, please contact this office.

## 2017-05-16 ENCOUNTER — Encounter: Payer: Self-pay | Admitting: Physician Assistant

## 2017-05-16 ENCOUNTER — Ambulatory Visit: Payer: Medicare HMO | Admitting: Physician Assistant

## 2017-05-16 ENCOUNTER — Other Ambulatory Visit: Payer: Self-pay

## 2017-05-16 VITALS — BP 136/80 | HR 93 | Temp 97.9°F | Resp 18 | Ht 63.78 in | Wt 189.8 lb

## 2017-05-16 DIAGNOSIS — B029 Zoster without complications: Secondary | ICD-10-CM

## 2017-05-16 DIAGNOSIS — H40033 Anatomical narrow angle, bilateral: Secondary | ICD-10-CM | POA: Diagnosis not present

## 2017-05-16 DIAGNOSIS — J209 Acute bronchitis, unspecified: Secondary | ICD-10-CM | POA: Diagnosis not present

## 2017-05-16 DIAGNOSIS — B0239 Other herpes zoster eye disease: Secondary | ICD-10-CM | POA: Diagnosis not present

## 2017-05-16 NOTE — Patient Instructions (Addendum)
Please go to [facility] right now for further evalation.  Address is [address] Phone number is [phone]  IF you received an x-ray today, you will receive an invoice from [facility]. Please contact [facility] at [phone] with questions or concerns regarding your invoice.   IF you received labwork today, you will receive an invoice from [facility]. Please contact [facility] at [phone] with questions or concerns regarding your invoice.   Our billing staff will not be able to assist you with questions regarding bills from these companies.  You will be contacted with the lab results as soon as they are available. The fastest way to get your results is to activate your My Chart account. Instructions are located on the last page of this paperwork. If you have not heard from us regarding the results in 2 weeks, please contact this office.

## 2017-05-16 NOTE — Progress Notes (Signed)
[name] [name]  MRN: [id number] DOB: 01/31/1941  Subjective:  [name] [name] is a 77 y.o. female seen in office today for a chief complaint of follow up on cough/wheezing and herpes zoster.  Patient initially seen on 05/14/17 for cough times 2 weeks.  Physical exam revealed diffuse wheezing, which improved with breathing treatment.  She also had a vesicular rash in V1 dermatome.  She was treated for bronchitis with bronchospasm and herpes zoster.  Given Rx for prednisone, azithromycin, and Valtrex.  She was also encouraged to contact her ophthalmologist to schedule an appointment due to distribution of herpes zoster.  An urgent referral to ophthalmology was also placed.  Please see that note for additional details. Today, she notes she is feeling great.  She feels about 85% better.  She has been taking all of her medications as prescribed.  Her cough and wheezing have improved.  She is not having any pain at the rash.  Denies fever, chills, sinus pain, ear pain, chest pain, nausea, nausea, vomiting, eye pain, blurred vision, decreased vision, and photophobia.  She did not contact her ophthalmologist.   Review of Systems  Per HPI  Patient Active Problem List   Diagnosis Date Noted  . Vaginal pain 07/28/2016  . Hoarseness 01/05/2016  . Decreased hearing 01/05/2016  . Anemia 01/05/2016  . Heart murmur 06/14/2015  . Mitral regurgitation 05/03/2015  . Total knee replacement status 01/04/2014  . Well woman exam 11/16/2013  . Osteoarthritis, knee 08/27/2011  . ACE-inhibitor cough 11/07/2010  . MURMUR 02/15/2010  . Iron deficiency anemia 04/18/2009  . Essential hypertension 04/07/2009  . DIVERTICULOSIS, SIGMOID COLON 03/09/2009  . OBESITY, CLASS I 12/14/2008  . TOBACCO USE 06/03/2006    Current Outpatient Medications on File Prior to Visit  Medication Sig Dispense Refill  . albuterol (PROVENTIL HFA;VENTOLIN HFA) 108 (90 Base) MCG/ACT inhaler Inhale 2 puffs into the lungs every 4 (four)  hours as needed for wheezing or shortness of breath (cough, shortness of breath or wheezing.). 1 Inhaler 1  . aspirin 81 MG tablet Take 81 mg by mouth daily.    Marland Kitchen. azithromycin (ZITHROMAX) 250 MG tablet Take 2 tabs PO x 1 dose, then 1 tab PO QD x 4 days 6 tablet 0  . carbamide peroxide (DEBROX) 6.5 % OTIC solution Place 5 drops into the left ear 2 (two) times daily. 15 mL 0  . ferrous sulfate 325 (65 FE) MG tablet TAKE 1 TABLET BY MOUTH EVERY DAY WITH BREAKFAST 90 tablet 0  . losartan-hydrochlorothiazide (HYZAAR) 50-12.5 MG tablet TAKE 1 TABLET BY MOUTH DAILY. 90 tablet 2  . losartan-hydrochlorothiazide (HYZAAR) 50-12.5 MG tablet TAKE 1 TABLET BY MOUTH DAILY. 45 tablet 3  . predniSONE (DELTASONE) 20 MG tablet Take 2 tablets (40 mg total) by mouth daily with breakfast for 5 days. 10 tablet 0  . timolol (TIMOPTIC) 0.5 % ophthalmic solution APPLY 1 DROP(S) IN BOTH EYES ONCE A DAY  5  . valACYclovir (VALTREX) 1000 MG tablet Take 1 tablet (1,000 mg total) by mouth 3 (three) times daily for 7 days. 21 tablet 0  . acetic acid 2 % otic solution Place 4 drops into the left ear 3 (three) times daily. (Patient not taking: Reported on 05/16/2017) 15 mL 0  . fluticasone (FLONASE) 50 MCG/ACT nasal spray Place 1 spray into both nostrils daily. 1 spray in each nostril every day (Patient not taking: Reported on 05/16/2017) 16 g 12   Current Facility-Administered Medications on File Prior to Visit  Medication Dose Route Frequency Provider Last Rate Last Dose  . albuterol (PROVENTIL) (2.5 MG/3ML) 0.083% nebulizer solution 2.5 mg  2.5 mg Nebulization Once Lyndall Windt, [place] D, PA-C        No Known Allergies   Objective:  BP 136/80 (BP Location: Right Arm, Patient Position: Sitting, Cuff Size: Normal)   Pulse 93   Temp 97.9 F (36.6 C) (Oral)   Resp 18   Ht 5' 3.78" (1.62 m)   Wt 189 lb 12.8 oz (86.1 kg)   SpO2 97%   BMI 32.80 kg/m   Physical Exam  Constitutional: She is oriented to person, place, and time  and well-developed, well-nourished, and in no distress.  HENT:  Head: Normocephalic and atraumatic.  Eyes: Conjunctivae are normal.  Neck: Normal range of motion.  Pulmonary/Chest: Effort normal and breath sounds normal. She has no decreased breath sounds. She has no wheezes. She has no rhonchi. She has no rales.  Neurological: She is alert and oriented to person, place, and time. Gait normal.  Skin: Skin is warm and dry.     Psychiatric: Affect normal.  Vitals reviewed.        Assessment and Plan :  1. Acute bronchitis with bronchospasm Improving.  No wheezing auscultated on exam.  Lungs CTAB.  Recommended she complete course of antibiotic and prednisone as prescribed.  Follow-up as needed.  2. Herpes zoster without complication Improving.  Recommended she be further evaluated by ophthalmology due to close proximity to left eye. Our referral team contact Dr. Laruth Bouchard office today and he is able to see her today before noon.  Given patient address to [facility] and encouraged her to go there now.  She agrees to do so.  Benjiman Core PA-C  Primary Care at [facility] 05/16/2017 10:44 AM

## 2017-05-30 ENCOUNTER — Other Ambulatory Visit: Payer: Self-pay | Admitting: Internal Medicine

## 2017-06-20 ENCOUNTER — Other Ambulatory Visit: Payer: Self-pay | Admitting: Family Medicine

## 2017-06-20 DIAGNOSIS — Z1231 Encounter for screening mammogram for malignant neoplasm of breast: Secondary | ICD-10-CM

## 2017-07-15 ENCOUNTER — Ambulatory Visit
Admission: RE | Admit: 2017-07-15 | Discharge: 2017-07-15 | Disposition: A | Discharge status: Home / Self Care | Payer: Medicare HMO | Source: Ambulatory Visit | Attending: Family Medicine | Admitting: Family Medicine

## 2017-07-15 DIAGNOSIS — Z1231 Encounter for screening mammogram for malignant neoplasm of breast: Secondary | ICD-10-CM

## 2017-08-07 ENCOUNTER — Other Ambulatory Visit: Payer: Self-pay | Admitting: Internal Medicine

## 2017-09-08 DIAGNOSIS — H401131 Primary open-angle glaucoma, bilateral, mild stage: Secondary | ICD-10-CM | POA: Diagnosis not present

## 2017-11-11 ENCOUNTER — Other Ambulatory Visit: Payer: Self-pay | Admitting: Internal Medicine

## 2017-11-18 DIAGNOSIS — H401132 Primary open-angle glaucoma, bilateral, moderate stage: Secondary | ICD-10-CM | POA: Diagnosis not present

## 2017-11-25 ENCOUNTER — Other Ambulatory Visit: Payer: Self-pay | Admitting: Urgent Care

## 2017-11-25 DIAGNOSIS — R062 Wheezing: Secondary | ICD-10-CM

## 2017-11-25 DIAGNOSIS — R0602 Shortness of breath: Secondary | ICD-10-CM

## 2017-11-25 MED ORDER — ALBUTEROL SULFATE HFA 108 (90 BASE) MCG/ACT IN AERS: 2.0000 | INHALATION_SPRAY | RESPIRATORY_TRACT | 0 refills | Status: DC | PRN

## 2017-12-26 ENCOUNTER — Other Ambulatory Visit: Payer: Self-pay | Admitting: Internal Medicine

## 2017-12-26 DIAGNOSIS — I10 Essential (primary) hypertension: Secondary | ICD-10-CM

## 2018-02-28 ENCOUNTER — Ambulatory Visit (INDEPENDENT_AMBULATORY_CARE_PROVIDER_SITE_OTHER): Payer: Medicare HMO | Admitting: Family Medicine

## 2018-02-28 DIAGNOSIS — Z23 Encounter for immunization: Secondary | ICD-10-CM | POA: Diagnosis not present

## 2018-03-18 DIAGNOSIS — H401132 Primary open-angle glaucoma, bilateral, moderate stage: Secondary | ICD-10-CM | POA: Diagnosis not present

## 2018-05-12 ENCOUNTER — Telehealth: Payer: Self-pay | Admitting: Family Medicine

## 2018-05-12 NOTE — Telephone Encounter (Signed)
Called patient's phone because she has appt tomorrow to offer option to accomplish needs without her coming in if possible.  Had to leave message with husband.  Patient will call in AM before coming in.  -Dr. Parke Simmers

## 2018-05-13 ENCOUNTER — Encounter: Payer: Medicare HMO | Admitting: Family Medicine

## 2018-05-25 ENCOUNTER — Other Ambulatory Visit: Payer: Self-pay

## 2018-05-26 ENCOUNTER — Other Ambulatory Visit: Payer: Self-pay | Admitting: *Deleted

## 2018-05-28 MED ORDER — FERROUS SULFATE 325 (65 FE) MG PO TABS: ORAL_TABLET | ORAL | 0 refills | Status: DC

## 2018-07-02 ENCOUNTER — Other Ambulatory Visit: Payer: Self-pay | Admitting: Family Medicine

## 2018-07-02 DIAGNOSIS — Z1231 Encounter for screening mammogram for malignant neoplasm of breast: Secondary | ICD-10-CM

## 2018-07-16 ENCOUNTER — Other Ambulatory Visit: Payer: Self-pay

## 2018-07-16 ENCOUNTER — Ambulatory Visit: Payer: Medicare HMO | Admitting: Family Medicine

## 2018-08-03 ENCOUNTER — Other Ambulatory Visit: Payer: Self-pay | Admitting: Family Medicine

## 2018-08-08 ENCOUNTER — Other Ambulatory Visit: Payer: Self-pay

## 2018-08-08 ENCOUNTER — Ambulatory Visit
Admission: RE | Admit: 2018-08-08 | Discharge: 2018-08-08 | Disposition: A | Discharge status: Home / Self Care | Payer: Medicare HMO | Source: Ambulatory Visit | Attending: Family Medicine | Admitting: Family Medicine

## 2018-08-08 DIAGNOSIS — Z1231 Encounter for screening mammogram for malignant neoplasm of breast: Secondary | ICD-10-CM | POA: Diagnosis not present

## 2018-08-18 ENCOUNTER — Other Ambulatory Visit: Payer: Self-pay | Admitting: Family Medicine

## 2018-08-19 ENCOUNTER — Ambulatory Visit (INDEPENDENT_AMBULATORY_CARE_PROVIDER_SITE_OTHER): Payer: Medicare HMO | Admitting: Family Medicine

## 2018-08-19 ENCOUNTER — Encounter: Payer: Self-pay | Admitting: Family Medicine

## 2018-08-19 ENCOUNTER — Other Ambulatory Visit: Payer: Self-pay

## 2018-08-19 VITALS — BP 124/60 | HR 80 | Ht 63.0 in | Wt 191.5 lb

## 2018-08-19 DIAGNOSIS — E669 Obesity, unspecified: Secondary | ICD-10-CM

## 2018-08-19 DIAGNOSIS — K5900 Constipation, unspecified: Secondary | ICD-10-CM

## 2018-08-19 DIAGNOSIS — Z87891 Personal history of nicotine dependence: Secondary | ICD-10-CM | POA: Diagnosis not present

## 2018-08-19 DIAGNOSIS — D509 Iron deficiency anemia, unspecified: Secondary | ICD-10-CM | POA: Diagnosis not present

## 2018-08-19 DIAGNOSIS — I10 Essential (primary) hypertension: Secondary | ICD-10-CM

## 2018-08-19 MED ORDER — TETANUS-DIPHTH-ACELL PERTUSSIS 5-2.5-18.5 LF-MCG/0.5 IM SUSP: 0.5000 mL | Freq: Once | INTRAMUSCULAR | 0 refills | Status: AC

## 2018-08-19 MED ORDER — ZOSTER VAC RECOMB ADJUVANTED 50 MCG/0.5ML IM SUSR: 0.5000 mL | Freq: Once | INTRAMUSCULAR | 0 refills | Status: AC

## 2018-08-19 NOTE — Assessment & Plan Note (Signed)
On iron supplementation, was likely 2/2 decreased iron intake given diet history. No evidence of blood loss on history or exam. Will recheck CBC today.

## 2018-08-19 NOTE — Addendum Note (Signed)
Addended by: Owens Shark, Lecretia Buczek on: 08/19/2018 12:06 PM   Modules accepted: Level of Service

## 2018-08-19 NOTE — Patient Instructions (Signed)
It was great to see you!  Our plans for today:  - Use miralax for your constipation. Make sure to eat lots of fruits and vegetables. See below for tips on a healthy diet. Walk for 10-15 minutes per day after your largest meal. - If you have a written copy of your living will or healthcare power of attorney, bring a copy to the clinic so we can scan it into your chart. - Take the prescriptions for the shingles and tetanus vaccines to the pharmacy.  We are checking some labs today, we will call you or send you a letter if they are abnormal.   Take care and seek immediate care sooner if you develop any concerns.   Dr. [facility]  Here is an example of what a healthy plate looks like:    ? Make half your plate fruits and vegetables.     ? Focus on whole fruits.     ? Vary your veggies.  ? Make half your grains whole grains. -     ? Look for the word "whole" at the beginning of the ingredients list    ? Some whole-grain ingredients include whole oats, whole-wheat flour,        whole-grain corn, whole-grain brown rice, and whole rye.  ? Move to low-fat and fat-free milk or yogurt.  ? Vary your protein routine. - Meat, fish, poultry (chicken, [place]), eggs, beans (kidney, pinto), dairy.  ? Drink and eat less sodium, saturated fat, and added sugars.  Things to do to keep yourself healthy  - Exercise at least 30-45 minutes a day, 3-4 days a week.  - Eat a low-fat diet with lots of fruits and vegetables, up to 7-9 servings per day.  - Seatbelts can save your life. Wear them always.  - Smoke detectors on every level of your home, check batteries every year.  - Eye Doctor - have an eye exam every 1-2 years  - Safe sex - if you may be exposed to STDs, use a condom.  - Alcohol -  If you drink, do it moderately, less than 2 drinks per day.  - [place]. Choose someone to speak for you if you are not able.  - Depression is common in our stressful  world.If you're feeling down or losing interest in things you normally enjoy, please come in for a visit.  - Violence - If anyone is threatening or hurting you, please call immediately.

## 2018-08-19 NOTE — Assessment & Plan Note (Signed)
At goal without symptoms of hypotension. No med changes made today. Will obtain BMP.

## 2018-08-19 NOTE — Progress Notes (Signed)
Subjective:   Patient ID: [name] [name]    DOB: [birth year], 78 y.o. female   MRN: [id number]  [name] [name] is a 78 y.o. female with a history of HTN, diverticulosis, obesity, IDA, tobacco use, OA here for well woman/preventative visit and annual GYN examination  Acute Concerns:  Constipation - will occasionally have hard stools. Tries to have BM every day but has difficulties sometimes. Drinks tea to soften stools. Hasn't tried miralax. No blood in stool.    Diet: eats once per day, snacks throughout the day. Eats before bed. Eats cookies.   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  Sexual/Birth History: G4P4, one adopted son, not currently sexually active with female partner. Married. Doesn't want to become sexually active  Birth Control: postmenopausal  POA/Living Will: has talked about her wishes with her family. Isn't sure if she has anything written down, her husband handles all of that  Review of Systems: Per HPI.    PMH, PSH, Medications, Allergies, and FmHx reviewed and updated in EMR.  Social History: former smoker, ~20 years, quit 10-15 years ago. Works at daycare center.  Immunization:  Tdap/TD: due  Influenza: n/a  Pneumococcal: UTD  Shingrix: due  Cancer Screening:  Pap Smear: n/a. Last 2015, normal  Mammogram: UTD, 07/2018, normal  Colonoscopy: n/a, last 2011 with diverticulosis and internal hemorrhoids  Dexa: UTD  Hypertension: - Medications: losartan-HCTZ 50-12.5mg  daily - Compliance: good - Checking BP at home: not usually - Denies any SOB, CP, vision changes, LE edema, medication SEs, or symptoms of hypotension - Diet: see above - Exercise: see above  Review of Systems:  Per HPI.  [place], medications and smoking status reviewed.  Objective:   BP 124/60   Pulse 80   Ht 5\' 3"  (1.6 m)   Wt 191 lb 8 oz (86.9 kg)   SpO2 97%   BMI 33.92 kg/m  Vitals and nursing note reviewed.  General: overweight elderly  female, in no acute distress with non-toxic appearance HEENT: normocephalic, atraumatic, moist mucous membranes Neck: supple, non-tender without lymphadenopathy CV: regular rate and rhythm without murmurs, rubs, or gallops, no lower extremity edema Lungs: clear to auscultation bilaterally with normal work of breathing Abdomen: soft, non-tender, non-distended, no masses or organomegaly palpable, normoactive bowel sounds Skin: warm, dry, no rashes or lesions Extremities: warm and well perfused, normal tone MSK: ROM grossly intact, strength intact, gait normal Neuro: Alert and oriented, speech normal  Assessment & Plan:   Essential hypertension At goal without symptoms of hypotension. No med changes made today. Will obtain BMP.  Obesity (BMI 30.0-34.9) Contributing to HTN. Counseling provided regarding weight loss through diet and exercise. See handout provided.  Iron deficiency anemia On iron supplementation, was likely 2/2 decreased iron intake given diet history. No evidence of blood loss on history or exam. Will recheck CBC today.  Health Maintenance UTD on age-appropriate cancer screenings. Will provide scripts for shingrix and Tdap vaccines.  Orders Placed This Encounter  Procedures  . CBC  . Basic Metabolic Panel   Meds ordered this encounter  Medications  . Zoster Vaccine Adjuvanted [facility]) injection    Sig: Inject 0.5 mLs into the muscle once for 1 dose.    Dispense:  0.5 mL    Refill:  0    Ok to give 2nd dose.  . Tdap (BOOSTRIX) 5-2.5-18.5 LF-MCG/0.5 injection    Sig: Inject 0.5 mLs into the muscle once for 1 dose.  Dispense:  0.5 mL    Refill:  0    Ellwood DenseAlison Tesla Keeler, DO PGY-2, [facility] 08/19/2018 10:58 AM

## 2018-08-19 NOTE — Assessment & Plan Note (Signed)
Contributing to HTN. Counseling provided regarding weight loss through diet and exercise. See handout provided.

## 2018-08-20 LAB — BASIC METABOLIC PANEL
BUN/Creatinine Ratio: 17 (ref 12–28)
BUN: 17 mg/dL (ref 8–27)
CO2: 23 mmol/L (ref 20–29)
Calcium: 9.4 mg/dL (ref 8.7–10.3)
Chloride: 108 mmol/L — ABNORMAL HIGH (ref 96–106)
Creatinine, Ser: 1.03 mg/dL — ABNORMAL HIGH (ref 0.57–1.00)
GFR calc Af Amer: 61 mL/min/{1.73_m2} (ref >59)
GFR calc non Af Amer: 53 mL/min/{1.73_m2} — ABNORMAL LOW (ref >59)
Glucose: 95 mg/dL (ref 65–99)
Potassium: 4.2 mmol/L (ref 3.5–5.2)
Sodium: 145 mmol/L — ABNORMAL HIGH (ref 134–144)

## 2018-08-20 LAB — CBC
Hematocrit: 34.4 % (ref 34.0–46.6)
Hemoglobin: 10.7 g/dL — ABNORMAL LOW (ref 11.1–15.9)
MCH: 24.2 pg — ABNORMAL LOW (ref 26.6–33.0)
MCHC: 31.1 g/dL — ABNORMAL LOW (ref 31.5–35.7)
MCV: 78 fL — ABNORMAL LOW (ref 79–97)
Platelets: 222 10*3/uL (ref 150–450)
RBC: 4.43 x10E6/uL (ref 3.77–5.28)
RDW: 14.9 % (ref 11.7–15.4)
WBC: 6.9 10*3/uL (ref 3.4–10.8)

## 2018-08-21 NOTE — Addendum Note (Signed)
Addended by: Myles Gip on: 08/21/2018 01:45 PM   Modules accepted: Orders

## 2018-08-25 ENCOUNTER — Ambulatory Visit: Payer: Medicare HMO

## 2018-08-26 ENCOUNTER — Other Ambulatory Visit: Payer: Self-pay

## 2018-08-26 NOTE — Telephone Encounter (Signed)
Patient called nurse line for recent blood work results. I informed patient her hemoglobin is lower than before, and she needs to continue taking her iron supplements. Patient stated she is out and needs a refill on them. Please advise. I also informed her of colonoscopy referral.

## 2018-08-27 MED ORDER — FERROUS SULFATE 325 (65 FE) MG PO TABS: ORAL_TABLET | ORAL | 1 refills | Status: DC

## 2018-09-21 ENCOUNTER — Other Ambulatory Visit: Payer: Self-pay | Admitting: *Deleted

## 2018-09-21 DIAGNOSIS — I10 Essential (primary) hypertension: Secondary | ICD-10-CM

## 2018-09-21 MED ORDER — LOSARTAN POTASSIUM-HCTZ 50-12.5 MG PO TABS: 1.0000 | ORAL_TABLET | Freq: Every day | ORAL | 3 refills | Status: DC

## 2018-09-22 DIAGNOSIS — H2513 Age-related nuclear cataract, bilateral: Secondary | ICD-10-CM | POA: Diagnosis not present

## 2018-09-22 DIAGNOSIS — H401132 Primary open-angle glaucoma, bilateral, moderate stage: Secondary | ICD-10-CM | POA: Diagnosis not present

## 2018-09-24 ENCOUNTER — Encounter: Payer: Self-pay | Admitting: Family Medicine

## 2018-10-13 ENCOUNTER — Ambulatory Visit (INDEPENDENT_AMBULATORY_CARE_PROVIDER_SITE_OTHER): Payer: Medicare HMO | Admitting: Registered Nurse

## 2018-10-13 ENCOUNTER — Ambulatory Visit (INDEPENDENT_AMBULATORY_CARE_PROVIDER_SITE_OTHER): Payer: Medicare HMO

## 2018-10-13 ENCOUNTER — Encounter: Payer: Self-pay | Admitting: Registered Nurse

## 2018-10-13 ENCOUNTER — Other Ambulatory Visit: Payer: Self-pay

## 2018-10-13 VITALS — BP 126/69 | HR 68 | Temp 97.6°F | Resp 16 | Ht 63.0 in | Wt 185.0 lb

## 2018-10-13 DIAGNOSIS — M25552 Pain in left hip: Secondary | ICD-10-CM

## 2018-10-13 DIAGNOSIS — M47816 Spondylosis without myelopathy or radiculopathy, lumbar region: Secondary | ICD-10-CM | POA: Insufficient documentation

## 2018-10-13 DIAGNOSIS — M545 Low back pain, unspecified: Secondary | ICD-10-CM

## 2018-10-13 DIAGNOSIS — S79911A Unspecified injury of right hip, initial encounter: Secondary | ICD-10-CM | POA: Diagnosis not present

## 2018-10-13 DIAGNOSIS — M25551 Pain in right hip: Secondary | ICD-10-CM | POA: Diagnosis not present

## 2018-10-13 MED ORDER — MELOXICAM 7.5 MG PO TABS: 7.5000 mg | ORAL_TABLET | Freq: Every day | ORAL | 0 refills | Status: DC

## 2018-10-13 NOTE — Patient Instructions (Signed)
° ° ° °  If you have lab work done today you will be contacted with your lab results within the next 2 weeks.  If you have not heard from us then please contact us. The fastest way to get your results is to register for My Chart. ° ° °IF you received an x-ray today, you will receive an invoice from [facility]. Please contact [facility] at [phone] with questions or concerns regarding your invoice.  ° °IF you received labwork today, you will receive an invoice from [facility]. Please contact [facility] at [phone] with questions or concerns regarding your invoice.  ° °Our billing staff will not be able to assist you with questions regarding bills from these companies. ° °You will be contacted with the lab results as soon as they are available. The fastest way to get your results is to activate your My Chart account. Instructions are located on the last page of this paperwork. If you have not heard from us regarding the results in 2 weeks, please contact this office. °  ° ° ° °

## 2018-10-13 NOTE — Progress Notes (Signed)
Established Patient Office Visit  Subjective:  Patient ID: [name] [name], female    DOB: [birth year]  Age: 78 y.o. MRN: [id number]  CC:  Chief Complaint  Patient presents with  . Fall    pt states she had a fall about 2 months again on her right side now have pain on the left side   . Hip Pain    pain from fall has become severe.     HPI [name] [name] presents for ongoing back and L hip pain  Pt states she fell around 2 months prior to today's visit. She states that she didn't think much of this at the time, but her ongoing back and hip pain have been increasingly problematic for her. She denies radiculopathy in her legs. Denies numbness, tingling, weakness in lower extremities, denies loss of bowel or bladder control. She has not fallen since. She has not had any other injuries that she's aware of. She is requesting Xrays today  Past Medical History:  Diagnosis Date  . Anemia    Fe def  . Arthritis   . GERD (gastroesophageal reflux disease)   . Glaucoma   . Hypertension   . Postmenopausal status     Past Surgical History:  Procedure Laterality Date  . CESAREAN SECTION     x2  . colonscopy      times 2  . Internal hemorrhoids    . JOINT REPLACEMENT  2011   left knee replacement  . KNEE ARTHROPLASTY  04/19/10   Dr Berenice Primas  . Sigmoid Diverticulosis    . TOTAL KNEE ARTHROPLASTY Right 01/04/2014   Procedure: RIGHT TOTAL KNEE ARTHROPLASTY;  Surgeon: Tobi Bastos, MD;  Location: [facility];  Service: Orthopedics;  Laterality: Right;  . TUBAL LIGATION  1978    Family History  Problem Relation Age of Onset  . Hypertension Father   . Stroke Father   . Prostate cancer Brother     Social History   Socioeconomic History  . Marital status: Married    Spouse name: Not on file  . Number of children: 4  . Years of education: Not on file  . Highest education level: Not on file  Occupational History  . Not on file  Social Needs  . Financial resource strain: Not on file   . Food insecurity    Worry: Not on file    Inability: Not on file  . Transportation needs    Medical: Not on file    Non-medical: Not on file  Tobacco Use  . Smoking status: Former Smoker    Packs/day: 1.00    Years: 33.00    Pack years: 33.00    Types: Cigarettes    Quit date: 02/25/2009    Years since quitting: 9.6  . Smokeless tobacco: Never Used  Substance and Sexual Activity  . Alcohol use: No  . Drug use: No  . Sexual activity: Yes  Lifestyle  . Physical activity    Days per week: Not on file    Minutes per session: Not on file  . Stress: Not on file  Relationships  . Social Herbalist on phone: Not on file    Gets together: Not on file    Attends religious service: Not on file    Active member of club or organization: Not on file    Attends meetings of clubs or organizations: Not on file    Relationship status: Not on file  . Intimate partner  violence    Fear of current or ex partner: Not on file    Emotionally abused: Not on file    Physically abused: Not on file    Forced sexual activity: Not on file  Other Topics Concern  . Not on file  Social History Narrative   Married, lives with husband and grandson (pt adopted him when he was 303 days old, age 76 now).     Works at [employer].     5 children (youngest adopted- he doesn't know)   smokes 1-2 pack per day until 2009   no EtOH   Drug use-no          Outpatient Medications Prior to Visit  Medication Sig Dispense Refill  . aspirin 81 MG tablet Take 81 mg by mouth daily.    . ferrous sulfate ([facility] IRON) 325 (65 FE) MG tablet TAKE 1 TABLET BY MOUTH EVERY DAY WITH BREAKFAST 90 tablet 1  . losartan-hydrochlorothiazide (HYZAAR) 50-12.5 MG tablet Take 1 tablet by mouth daily. 90 tablet 3  . timolol (TIMOPTIC) 0.5 % ophthalmic solution APPLY 1 DROP(S) IN BOTH EYES ONCE A DAY  5  . VENTOLIN HFA 108 (90 Base) MCG/ACT inhaler TAKE 2 PUFFS BY MOUTH EVERY 6 HOURS AS NEEDED 18 Inhaler 1   No  facility-administered medications prior to visit.     No Known Allergies  ROS Review of Systems Per hpi   Objective:    Physical Exam  Constitutional: She is oriented to person, place, and time. She appears well-developed and well-nourished.  Cardiovascular: Normal rate and regular rhythm.  Pulmonary/Chest: Effort normal. No respiratory distress.  Musculoskeletal: Normal range of motion.        General: Tenderness (lumbar spine, L3-S1. ) present. No deformity or edema.     Left hip: Normal.     Lumbar back: She exhibits tenderness, bony tenderness and pain.  Neurological: She is alert and oriented to person, place, and time. A cranial nerve deficit is present.  Psychiatric: She has a normal mood and affect. Her behavior is normal. Judgment and thought content normal.    BP 126/69   Pulse 68   Temp 97.6 F (36.4 C) (Oral)   Resp 16   Ht 5\' 3"  (1.6 m)   Wt 185 lb (83.9 kg)   SpO2 98%   BMI 32.77 kg/m  Wt Readings from Last 3 Encounters:  10/13/18 185 lb (83.9 kg)  08/19/18 191 lb 8 oz (86.9 kg)  05/16/17 189 lb 12.8 oz (86.1 kg)     Health Maintenance Due  Topic Date Due  . TETANUS/TDAP  06/02/2016  . INFLUENZA VACCINE  09/26/2018    There are no preventive care reminders to display for this patient.  Lab Results  Component Value Date   TSH 1.519 06/03/2006   Lab Results  Component Value Date   WBC 6.9 08/19/2018   HGB 10.7 (L) 08/19/2018   HCT 34.4 08/19/2018   MCV 78 (L) 08/19/2018   PLT 222 08/19/2018   Lab Results  Component Value Date   NA 145 (H) 08/19/2018   K 4.2 08/19/2018   CO2 23 08/19/2018   GLUCOSE 95 08/19/2018   BUN 17 08/19/2018   CREATININE 1.03 (H) 08/19/2018   BILITOT <0.2 08/10/2016   ALKPHOS 90 08/10/2016   AST 13 08/10/2016   ALT 11 08/10/2016   PROT 7.2 08/10/2016   ALBUMIN 4.0 08/10/2016   CALCIUM 9.4 08/19/2018   ANIONGAP 11 01/06/2014  Lab Results  Component Value Date   CHOL 173 10/12/2012   Lab Results   Component Value Date   HDL 43 10/12/2012   Lab Results  Component Value Date   LDLCALC 111 (H) 10/12/2012   Lab Results  Component Value Date   TRIG 96 10/12/2012   Lab Results  Component Value Date   CHOLHDL 4.0 10/12/2012   No results found for: HGBA1C    Assessment & Plan:   Problem List Items Addressed This Visit    None    Visit Diagnoses    Low back pain, unspecified back pain laterality, unspecified chronicity, unspecified whether sciatica present    -  Primary   Relevant Orders   DG Lumbar Spine Complete (Completed)   Pain of left hip joint       Relevant Orders   DG Hip Unilat W OR W/O Pelvis 2-3 Views Right (Completed)   Osteoarthritis of lumbar spine, unspecified spinal osteoarthritis complication status       Relevant Orders   Ambulatory referral to Physical Therapy      No orders of the defined types were placed in this encounter.   Follow-up: No follow-ups on file.   PLAN  Xray reveals DJD of L3-S1 of spine, no abnormalities in hip. Likely some radiculopathy starting down L side to give some discomfort through hip. Pt requests copy of Xrays to present to chiropractor. Will also refer to PT  Patient encouraged to call clinic with any questions, comments, or concerns.   Janeece Ageeichard Seville Brick, NP

## 2018-10-18 DIAGNOSIS — K219 Gastro-esophageal reflux disease without esophagitis: Secondary | ICD-10-CM | POA: Diagnosis not present

## 2018-10-18 DIAGNOSIS — M5136 Other intervertebral disc degeneration, lumbar region: Secondary | ICD-10-CM | POA: Diagnosis not present

## 2018-10-18 DIAGNOSIS — Z87891 Personal history of nicotine dependence: Secondary | ICD-10-CM | POA: Diagnosis not present

## 2018-10-18 DIAGNOSIS — M1991 Primary osteoarthritis, unspecified site: Secondary | ICD-10-CM | POA: Diagnosis not present

## 2018-10-18 DIAGNOSIS — Z7982 Long term (current) use of aspirin: Secondary | ICD-10-CM | POA: Diagnosis not present

## 2018-10-18 DIAGNOSIS — M4316 Spondylolisthesis, lumbar region: Secondary | ICD-10-CM | POA: Diagnosis not present

## 2018-10-18 DIAGNOSIS — M47816 Spondylosis without myelopathy or radiculopathy, lumbar region: Secondary | ICD-10-CM | POA: Diagnosis not present

## 2018-10-18 DIAGNOSIS — M62830 Muscle spasm of back: Secondary | ICD-10-CM | POA: Diagnosis not present

## 2018-10-18 DIAGNOSIS — M4186 Other forms of scoliosis, lumbar region: Secondary | ICD-10-CM | POA: Diagnosis not present

## 2018-10-18 DIAGNOSIS — M545 Low back pain: Secondary | ICD-10-CM | POA: Diagnosis not present

## 2018-10-18 DIAGNOSIS — I10 Essential (primary) hypertension: Secondary | ICD-10-CM | POA: Diagnosis not present

## 2018-10-18 DIAGNOSIS — Z79899 Other long term (current) drug therapy: Secondary | ICD-10-CM | POA: Diagnosis not present

## 2018-11-14 ENCOUNTER — Other Ambulatory Visit: Payer: Self-pay | Admitting: Registered Nurse

## 2018-11-14 DIAGNOSIS — M545 Low back pain, unspecified: Secondary | ICD-10-CM

## 2018-11-16 NOTE — Telephone Encounter (Signed)
Requested medication (s) are due for refill today: yes  Requested medication (s) are on the active medication list: yes  Last refill:  10/07/2018  Future visit scheduled: no  Notes to clinic:  Ordering provider and pcp are different   Requested Prescriptions  Pending Prescriptions Disp Refills   meloxicam (MOBIC) 7.5 MG tablet [Pharmacy Med Name: MELOXICAM 7.5 MG TABLET] 30 tablet 0    Sig: TAKE 1 TABLET BY MOUTH EVERY DAY     Analgesics:  COX2 Inhibitors Failed - 11/14/2018  5:06 PM      Failed - HGB in normal range and within 360 days    Hemoglobin  Date Value Ref Range Status  08/19/2018 10.7 (L) 11.1 - 15.9 g/dL Final         Failed - Cr in normal range and within 360 days    Creat  Date Value Ref Range Status  01/03/2016 0.80 0.60 - 0.93 mg/dL Final    Comment:      For patients > or = 76 years of age: The upper reference limit for Creatinine is approximately 13% higher for people identified as African-American.      Creatinine, Ser  Date Value Ref Range Status  08/19/2018 1.03 (H) 0.57 - 1.00 mg/dL Final         Passed - Patient is not pregnant      Passed - Valid encounter within last 12 months    Recent Outpatient Visits          1 month ago Low back pain, unspecified back pain laterality, unspecified chronicity, unspecified whether sciatica present   [facility], NP   8 months ago Need for immunization against influenza   [facility], Lilia Argue, MD   1 year ago Acute bronchitis with bronchospasm   [facility], [place] D, PA-C   1 year ago Wheezing   [facility] [place], [place] D, PA-C   1 year ago Hearing loss due to cerumen impaction, left   [facility] [place], PA-C

## 2018-11-23 DIAGNOSIS — H401132 Primary open-angle glaucoma, bilateral, moderate stage: Secondary | ICD-10-CM | POA: Diagnosis not present

## 2019-02-27 ENCOUNTER — Other Ambulatory Visit: Payer: Self-pay | Admitting: Family Medicine

## 2019-03-26 ENCOUNTER — Other Ambulatory Visit: Payer: Self-pay

## 2019-03-26 ENCOUNTER — Ambulatory Visit: Payer: Medicare HMO

## 2019-03-26 DIAGNOSIS — H401132 Primary open-angle glaucoma, bilateral, moderate stage: Secondary | ICD-10-CM | POA: Diagnosis not present

## 2019-03-26 NOTE — Progress Notes (Signed)
Patient did not answer- disregard.

## 2019-04-06 DIAGNOSIS — H401132 Primary open-angle glaucoma, bilateral, moderate stage: Secondary | ICD-10-CM | POA: Diagnosis not present

## 2019-04-30 DIAGNOSIS — H401132 Primary open-angle glaucoma, bilateral, moderate stage: Secondary | ICD-10-CM | POA: Diagnosis not present

## 2019-07-02 DIAGNOSIS — H402233 Chronic angle-closure glaucoma, bilateral, severe stage: Secondary | ICD-10-CM | POA: Diagnosis not present

## 2019-08-03 DIAGNOSIS — I10 Essential (primary) hypertension: Secondary | ICD-10-CM | POA: Diagnosis not present

## 2019-08-03 DIAGNOSIS — D649 Anemia, unspecified: Secondary | ICD-10-CM | POA: Diagnosis not present

## 2019-08-03 DIAGNOSIS — H2512 Age-related nuclear cataract, left eye: Secondary | ICD-10-CM | POA: Diagnosis not present

## 2019-08-03 DIAGNOSIS — Z79899 Other long term (current) drug therapy: Secondary | ICD-10-CM | POA: Diagnosis not present

## 2019-08-03 DIAGNOSIS — H402233 Chronic angle-closure glaucoma, bilateral, severe stage: Secondary | ICD-10-CM | POA: Diagnosis not present

## 2019-08-03 DIAGNOSIS — Z87891 Personal history of nicotine dependence: Secondary | ICD-10-CM | POA: Diagnosis not present

## 2019-08-08 DIAGNOSIS — Z87891 Personal history of nicotine dependence: Secondary | ICD-10-CM | POA: Diagnosis not present

## 2019-08-08 DIAGNOSIS — I10 Essential (primary) hypertension: Secondary | ICD-10-CM | POA: Diagnosis not present

## 2019-08-08 DIAGNOSIS — Z7982 Long term (current) use of aspirin: Secondary | ICD-10-CM | POA: Diagnosis not present

## 2019-08-08 DIAGNOSIS — Z79899 Other long term (current) drug therapy: Secondary | ICD-10-CM | POA: Diagnosis not present

## 2019-08-08 DIAGNOSIS — M25552 Pain in left hip: Secondary | ICD-10-CM | POA: Diagnosis not present

## 2019-08-08 DIAGNOSIS — M545 Low back pain: Secondary | ICD-10-CM | POA: Diagnosis not present

## 2019-08-08 DIAGNOSIS — D649 Anemia, unspecified: Secondary | ICD-10-CM | POA: Diagnosis not present

## 2019-08-08 DIAGNOSIS — M4316 Spondylolisthesis, lumbar region: Secondary | ICD-10-CM | POA: Diagnosis not present

## 2019-08-08 DIAGNOSIS — M25551 Pain in right hip: Secondary | ICD-10-CM | POA: Diagnosis not present

## 2019-08-08 DIAGNOSIS — M47816 Spondylosis without myelopathy or radiculopathy, lumbar region: Secondary | ICD-10-CM | POA: Diagnosis not present

## 2019-08-08 DIAGNOSIS — M5136 Other intervertebral disc degeneration, lumbar region: Secondary | ICD-10-CM | POA: Diagnosis not present

## 2019-08-09 ENCOUNTER — Other Ambulatory Visit: Payer: Self-pay | Admitting: *Deleted

## 2019-08-09 MED ORDER — FERROUS SULFATE 325 (65 FE) MG PO TABS: 325.0000 mg | ORAL_TABLET | Freq: Every day | ORAL | 1 refills | Status: DC

## 2019-08-09 NOTE — Telephone Encounter (Signed)
LM for patient to call back and schedule an appointment.  Lavonne Kinderman,CMA  

## 2019-08-24 DIAGNOSIS — H269 Unspecified cataract: Secondary | ICD-10-CM | POA: Diagnosis not present

## 2019-08-24 DIAGNOSIS — I10 Essential (primary) hypertension: Secondary | ICD-10-CM | POA: Diagnosis not present

## 2019-08-24 DIAGNOSIS — Z79899 Other long term (current) drug therapy: Secondary | ICD-10-CM | POA: Diagnosis not present

## 2019-08-24 DIAGNOSIS — D649 Anemia, unspecified: Secondary | ICD-10-CM | POA: Diagnosis not present

## 2019-08-24 DIAGNOSIS — Z87891 Personal history of nicotine dependence: Secondary | ICD-10-CM | POA: Diagnosis not present

## 2019-08-24 DIAGNOSIS — R011 Cardiac murmur, unspecified: Secondary | ICD-10-CM | POA: Diagnosis not present

## 2019-08-24 DIAGNOSIS — H2511 Age-related nuclear cataract, right eye: Secondary | ICD-10-CM | POA: Diagnosis not present

## 2019-09-11 ENCOUNTER — Other Ambulatory Visit: Payer: Self-pay | Admitting: Family Medicine

## 2019-09-11 DIAGNOSIS — I10 Essential (primary) hypertension: Secondary | ICD-10-CM

## 2019-10-01 DIAGNOSIS — H402233 Chronic angle-closure glaucoma, bilateral, severe stage: Secondary | ICD-10-CM | POA: Diagnosis not present

## 2019-11-12 ENCOUNTER — Other Ambulatory Visit: Payer: Self-pay | Admitting: Family Medicine

## 2019-11-12 DIAGNOSIS — Z1231 Encounter for screening mammogram for malignant neoplasm of breast: Secondary | ICD-10-CM

## 2019-11-18 ENCOUNTER — Other Ambulatory Visit: Payer: Self-pay

## 2019-11-18 ENCOUNTER — Encounter: Payer: Self-pay | Admitting: Family Medicine

## 2019-11-18 ENCOUNTER — Ambulatory Visit (INDEPENDENT_AMBULATORY_CARE_PROVIDER_SITE_OTHER): Payer: Medicare HMO | Admitting: Family Medicine

## 2019-11-18 VITALS — BP 112/64 | Ht 63.0 in | Wt 186.2 lb

## 2019-11-18 DIAGNOSIS — I10 Essential (primary) hypertension: Secondary | ICD-10-CM

## 2019-11-18 DIAGNOSIS — Z833 Family history of diabetes mellitus: Secondary | ICD-10-CM | POA: Diagnosis not present

## 2019-11-18 DIAGNOSIS — R7309 Other abnormal glucose: Secondary | ICD-10-CM | POA: Diagnosis not present

## 2019-11-18 DIAGNOSIS — Z23 Encounter for immunization: Secondary | ICD-10-CM

## 2019-11-18 DIAGNOSIS — Z13220 Encounter for screening for lipoid disorders: Secondary | ICD-10-CM | POA: Diagnosis not present

## 2019-11-18 DIAGNOSIS — Z122 Encounter for screening for malignant neoplasm of respiratory organs: Secondary | ICD-10-CM

## 2019-11-18 DIAGNOSIS — Z1159 Encounter for screening for other viral diseases: Secondary | ICD-10-CM | POA: Diagnosis not present

## 2019-11-18 DIAGNOSIS — Z0000 Encounter for general adult medical examination without abnormal findings: Secondary | ICD-10-CM | POA: Diagnosis not present

## 2019-11-18 LAB — POCT GLYCOSYLATED HEMOGLOBIN (HGB A1C): Hemoglobin A1C: 5.3 % (ref 4.0–5.6)

## 2019-11-18 NOTE — Assessment & Plan Note (Signed)
HTN well controlled. Continue current medications  - BMP, a1c and lipid panel today

## 2019-11-18 NOTE — Assessment & Plan Note (Signed)
Fairly active and healthy elderly female.   - received flu today, COVID vaccinated, PNA vaccinated, inquire about Shingles vaccine at follow visit  - former smoker (~30 pk years): low dose CT Chest for lung cancer screening  - discussed another colonoscopy with patient and she declined. Consider FIT testing in the future.  - mammogram scheduled for 11/25/19  - DEXA scan completed  - schedule Medicare annual wellness visit - follow up labs

## 2019-11-18 NOTE — Progress Notes (Signed)
   SUBJECTIVE:   CHIEF COMPLAINT / HPI:    [name] [name] is a 79 y.o. female present for follow up.   Patient working at [employer] as she likes to remain active. Reports some rectal discomfort when she is constipated.  Otherwise has no concerns.      PERTINENT  PMH / PSH: reviewed and updated as appropriate   OBJECTIVE:   BP 112/64   Ht 5\' 3"  (1.6 m)   Wt 186 lb 4 oz (84.5 kg)   BMI 32.99 kg/m   GEN: pleasant elderly female, in no acute distress  CV: regular rate and rhythm, distal pulses intact  RESP: no increased work of breathing, clear to ascultation bilaterally  ABD: Bowel sounds present. Soft, Nontender, Nondistended.  MSK: no edema, or cyanosis noted SKIN: warm, dry NEURO: grossly normal, alert and oriented, moves all extremities appropriately PSYCH: Normal affect, appropriate speech and behavior    ASSESSMENT/PLAN:   Essential hypertension HTN well controlled. Continue current medications  - BMP, a1c and lipid panel today   Healthcare maintenance Fairly active and healthy elderly female.   - received flu today, COVID vaccinated, PNA vaccinated, inquire about Shingles vaccine at follow visit  - former smoker (~30 pk years): low dose CT Chest for lung cancer screening  - discussed another colonoscopy with patient and she declined. Consider FIT testing in the future.  - mammogram scheduled for 11/25/19  - DEXA scan completed  - schedule Medicare annual wellness visit - follow up labs        11/27/19, DO PGY-2, [facility] 11/18/2019

## 2019-11-18 NOTE — Patient Instructions (Signed)
It was great seeing you today!  Please check-out at the front desk before leaving the clinic. I'd like to see you back in 6 months but if you need to be seen earlier than that for any new issues we're happy to fit you in, just give [place] a call!  Visit Remembers: - Continue to work on your healthy eating habits and incorporating exercise into your daily life.  - Your goal is to have an BP < 120/80    Regarding lab work today:  Due to recent changes in healthcare laws, you may see the results of your imaging and laboratory studies on MyChart before your provider has had a chance to review them.  I understand that in some cases there may be results that are confusing or concerning to you. Not all laboratory results come back in the same time frame and you may be waiting for multiple results in order to interpret others.  Please give [place] 72 hours in order for your provider to thoroughly review all the results before contacting the office for clarification of your results. If everything is normal, you will get a letter in the mail or a message in My Chart. Please give [place] a call if you do not hear from [place] after 2 weeks.  Please bring all of your medications with you to each visit.    If you haven't already, sign up for My Chart to have easy access to your labs results, and communication with your primary care physician.  Feel free to call with any questions or concerns at any time, at [phone].   Take care,  Dr. Katherina [facility]

## 2019-11-19 LAB — LIPID PANEL
Chol/HDL Ratio: 3.4 ratio (ref 0.0–4.4)
Cholesterol, Total: 165 mg/dL (ref 100–199)
HDL: 48 mg/dL (ref >39)
LDL Chol Calc (NIH): 98 mg/dL (ref 0–99)
Triglycerides: 105 mg/dL (ref 0–149)
VLDL Cholesterol Cal: 19 mg/dL (ref 5–40)

## 2019-11-19 LAB — BASIC METABOLIC PANEL
BUN/Creatinine Ratio: 16 (ref 12–28)
BUN: 18 mg/dL (ref 8–27)
CO2: 25 mmol/L (ref 20–29)
Calcium: 9.1 mg/dL (ref 8.7–10.3)
Chloride: 102 mmol/L (ref 96–106)
Creatinine, Ser: 1.1 mg/dL — ABNORMAL HIGH (ref 0.57–1.00)
GFR calc Af Amer: 56 mL/min/{1.73_m2} — ABNORMAL LOW (ref >59)
GFR calc non Af Amer: 49 mL/min/{1.73_m2} — ABNORMAL LOW (ref >59)
Glucose: 90 mg/dL (ref 65–99)
Potassium: 4.4 mmol/L (ref 3.5–5.2)
Sodium: 143 mmol/L (ref 134–144)

## 2019-11-19 LAB — HEPATITIS C ANTIBODY: Hep C Virus Ab: <0.1 s/co ratio (ref 0.0–0.9)

## 2019-11-25 ENCOUNTER — Other Ambulatory Visit: Payer: Self-pay

## 2019-11-25 ENCOUNTER — Ambulatory Visit
Admission: RE | Admit: 2019-11-25 | Discharge: 2019-11-25 | Disposition: A | Discharge status: Home / Self Care | Payer: Medicare HMO | Source: Ambulatory Visit | Attending: *Deleted | Admitting: *Deleted

## 2019-11-25 DIAGNOSIS — Z1231 Encounter for screening mammogram for malignant neoplasm of breast: Secondary | ICD-10-CM

## 2019-12-02 ENCOUNTER — Telehealth: Payer: Self-pay

## 2019-12-02 NOTE — Telephone Encounter (Signed)
Received phone call from [facility] stating that patient does not qualify for CT Chest Lung Cancer screening as patient quit smoking in 2002.   Stated that order would need to be changed to CT chest with out contrast.   Forwarding to PCP and Dr. Lum Babe (authorizing provider on order).   Please return call to direct line at [facility] at [phone] with any questions.   Veronda Prude, RN

## 2019-12-02 NOTE — Telephone Encounter (Signed)
Deferring to PCP

## 2019-12-08 ENCOUNTER — Inpatient Hospital Stay: Admission: RE | Admit: 2019-12-08 | Payer: Medicare HMO | Source: Ambulatory Visit

## 2020-02-04 DIAGNOSIS — H402232 Chronic angle-closure glaucoma, bilateral, moderate stage: Secondary | ICD-10-CM | POA: Diagnosis not present

## 2020-02-25 ENCOUNTER — Other Ambulatory Visit: Payer: Self-pay | Admitting: Family Medicine

## 2020-05-11 ENCOUNTER — Telehealth: Payer: Self-pay | Admitting: Family Medicine

## 2020-05-11 NOTE — Telephone Encounter (Signed)
Called patient to schedule AWV if patient calls back please assist with scheduling any question please ask me. Thanks!

## 2020-05-19 DIAGNOSIS — Z79899 Other long term (current) drug therapy: Secondary | ICD-10-CM | POA: Diagnosis not present

## 2020-05-19 DIAGNOSIS — H4010X2 Unspecified open-angle glaucoma, moderate stage: Secondary | ICD-10-CM | POA: Diagnosis not present

## 2020-05-19 DIAGNOSIS — H4010X1 Unspecified open-angle glaucoma, mild stage: Secondary | ICD-10-CM | POA: Diagnosis not present

## 2020-05-19 DIAGNOSIS — Z961 Presence of intraocular lens: Secondary | ICD-10-CM | POA: Diagnosis not present

## 2020-06-23 DIAGNOSIS — M84374A Stress fracture, right foot, initial encounter for fracture: Secondary | ICD-10-CM | POA: Diagnosis not present

## 2020-08-01 ENCOUNTER — Other Ambulatory Visit: Payer: Self-pay | Admitting: Family Medicine

## 2020-08-01 DIAGNOSIS — M19071 Primary osteoarthritis, right ankle and foot: Secondary | ICD-10-CM | POA: Diagnosis not present

## 2020-08-01 DIAGNOSIS — M19072 Primary osteoarthritis, left ankle and foot: Secondary | ICD-10-CM | POA: Diagnosis not present

## 2020-08-01 DIAGNOSIS — M792 Neuralgia and neuritis, unspecified: Secondary | ICD-10-CM | POA: Diagnosis not present

## 2020-08-01 DIAGNOSIS — M7751 Other enthesopathy of right foot: Secondary | ICD-10-CM | POA: Diagnosis not present

## 2020-08-01 DIAGNOSIS — M7752 Other enthesopathy of left foot: Secondary | ICD-10-CM | POA: Diagnosis not present

## 2020-08-22 ENCOUNTER — Other Ambulatory Visit: Payer: Self-pay

## 2020-08-22 ENCOUNTER — Encounter: Payer: Self-pay | Admitting: Family Medicine

## 2020-08-22 ENCOUNTER — Ambulatory Visit (INDEPENDENT_AMBULATORY_CARE_PROVIDER_SITE_OTHER): Payer: Medicare HMO | Admitting: Family Medicine

## 2020-08-22 VITALS — BP 132/60 | HR 76 | Ht 63.0 in | Wt 187.2 lb

## 2020-08-22 DIAGNOSIS — M47816 Spondylosis without myelopathy or radiculopathy, lumbar region: Secondary | ICD-10-CM

## 2020-08-22 DIAGNOSIS — I10 Essential (primary) hypertension: Secondary | ICD-10-CM

## 2020-08-22 DIAGNOSIS — D649 Anemia, unspecified: Secondary | ICD-10-CM | POA: Diagnosis not present

## 2020-08-22 NOTE — Progress Notes (Signed)
   SUBJECTIVE:   CHIEF COMPLAINT / HPI:      [name] [name] is a 80 y.o. female here for follow up for HTN and back pain.   HTN Takes Losartan/HCTZ. Denies missing doses of antihypertensive medications. Denies chest pain, palpitations, lower extremity edema, exertional dyspnea, lightheadedness, headaches and vision changes.   Anemia  No longer takes iron but would like to be retested.   Back pain  Fell at the daycare after tripping over the mats. Has a stress fx of the right foot. She was referrred to a neurosurgeron but in [facility] Deniue perianal numbness, cancer, unexplained weight loss, immunosuppression, prolonged use of steroids, history of IV drug use, urinary tract infection, pain that is increased or unrelieved by rest, fever, bladder or bowel incontinence, significant trauma related to age.      PERTINENT  PMH / PSH: reviewed and updated as appropriate   OBJECTIVE:   BP 132/60   Pulse 76   Ht 5\' 3"  (1.6 m)   Wt 187 lb 4 oz (84.9 kg)   SpO2 99%   BMI 33.17 kg/m    GEN: well appearing female,  in no acute distress  CV: regular rate and rhythm RESP: no increased work of breathing, clear to ascultation bilaterally with no crackles, wheezes, or rhonchi  MSK: no midline C, T or L spine tenderness, good rotation, extension and flexion of spine, no paraspinal tenderness  SKIN: warm, dry NEURO: strength 5/5, gross sensation intact, moves all extremities appropriately    ASSESSMENT/PLAN:   Degenerative joint disease (DJD) of lumbar spine Hx of severe degenerative disc disease at L2-L5Discussed with patient gradually returning to normal activities, as tolerated. Pt to continue ordinary activities within the limits permitted by pain. Tylenol 1000 mg TID for pain relief.  Avoid NSAIDs 2/2 to age. Counseled patient on red flag symptoms and when to seek immediate care.  No red flags suggesting cauda equina syndrome or progressive major motor weakness. Patient to return  if symptoms do not improve with conservative treatment.  L-spine ordered today. Consider MRI at follow up if not improved.   Anemia CBC today. Consider iron studies if hgb low.   Essential hypertension BP at goal. Continue Losartan/HCTZ. BMP and lipid panel today     Shingles and Tetanus vaccines - discuss at follow up.  COVID booster declined.  \  , DO PGY-2, [facility] 08/22/2020        I

## 2020-08-22 NOTE — Patient Instructions (Signed)
It was great seeing you today!  Please check-out at the front desk before leaving the clinic. I'd like to see you back in 4 weeks but if you need to be seen earlier than that for any new issues we're happy to fit you in, just give [place] a call!   Tylenol 1000 mg three times a day. Bengay topically as needed. Stop by the dollar store to pick 5-7 Lidocaine patches. Apply the patch fpr 12 hours a day.   Watch for worsening symptoms such as an increasing weakness or loss of sensation legs, increasing pain and the loss of bladder or bowel function. Should any of these occur, go to the emergency department immediately.   Visit Remembers: - Stop by the pharmacy to pick up your prescriptions  - Continue to work on your healthy eating habits and incorporating exercise into your daily life.  - Your goal is to have an BP < 130/80    Regarding lab work today:  Due to recent changes in healthcare laws, you may see the results of your imaging and laboratory studies on MyChart before your provider has had a chance to review them.  I understand that in some cases there may be results that are confusing or concerning to you. Not all laboratory results come back in the same time frame and you may be waiting for multiple results in order to interpret others.  Please give [place] 72 hours in order for your provider to thoroughly review all the results before contacting the office for clarification of your results. If everything is normal, you will get a letter in the mail or a message in My Chart. Please give [place] a call if you do not hear from [place] after 2 weeks.  Please bring all of your medications with you to each visit.    If you haven't already, sign up for My Chart to have easy access to your labs results, and communication with your primary care physician.  Feel free to call with any questions or concerns at any time, at [phone].   Take care,  Dr. Katherina Right [facility]

## 2020-08-23 LAB — LIPID PANEL
Chol/HDL Ratio: 3 ratio (ref 0.0–4.4)
Cholesterol, Total: 170 mg/dL (ref 100–199)
HDL: 56 mg/dL (ref >39)
LDL Chol Calc (NIH): 100 mg/dL — ABNORMAL HIGH (ref 0–99)
Triglycerides: 75 mg/dL (ref 0–149)
VLDL Cholesterol Cal: 14 mg/dL (ref 5–40)

## 2020-08-23 LAB — BASIC METABOLIC PANEL
BUN/Creatinine Ratio: 19 (ref 12–28)
BUN: 23 mg/dL (ref 8–27)
CO2: 24 mmol/L (ref 20–29)
Calcium: 9.2 mg/dL (ref 8.7–10.3)
Chloride: 101 mmol/L (ref 96–106)
Creatinine, Ser: 1.24 mg/dL — ABNORMAL HIGH (ref 0.57–1.00)
Glucose: 128 mg/dL — ABNORMAL HIGH (ref 65–99)
Potassium: 3.8 mmol/L (ref 3.5–5.2)
Sodium: 140 mmol/L (ref 134–144)
eGFR: 45 mL/min/{1.73_m2} — ABNORMAL LOW (ref >59)

## 2020-08-23 LAB — CBC
Hematocrit: 28.5 % — ABNORMAL LOW (ref 34.0–46.6)
Hemoglobin: 8.5 g/dL — ABNORMAL LOW (ref 11.1–15.9)
MCH: 21.3 pg — ABNORMAL LOW (ref 26.6–33.0)
MCHC: 29.8 g/dL — ABNORMAL LOW (ref 31.5–35.7)
MCV: 71 fL — ABNORMAL LOW (ref 79–97)
Platelets: 323 10*3/uL (ref 150–450)
RBC: 3.99 x10E6/uL (ref 3.77–5.28)
RDW: 14.3 % (ref 11.7–15.4)
WBC: 9 10*3/uL (ref 3.4–10.8)

## 2020-08-24 ENCOUNTER — Other Ambulatory Visit: Payer: Self-pay | Admitting: Family Medicine

## 2020-08-24 DIAGNOSIS — I10 Essential (primary) hypertension: Secondary | ICD-10-CM

## 2020-08-24 NOTE — Assessment & Plan Note (Addendum)
BP at goal. Continue Losartan/HCTZ. BMP and lipid panel today

## 2020-08-24 NOTE — Assessment & Plan Note (Signed)
CBC today. Consider iron studies if hgb low.

## 2020-08-24 NOTE — Assessment & Plan Note (Signed)
Hx of severe degenerative disc disease at L2-L5Discussed with patient gradually returning to normal activities, as tolerated. Pt to continue ordinary activities within the limits permitted by pain. Tylenol 1000 mg TID for pain relief.  Avoid NSAIDs 2/2 to age. Counseled patient on red flag symptoms and when to seek immediate care.  No red flags suggesting cauda equina syndrome or progressive major motor weakness. Patient to return if symptoms do not improve with conservative treatment.  L-spine ordered today. Consider MRI at follow up if not improved.

## 2020-09-20 ENCOUNTER — Ambulatory Visit (INDEPENDENT_AMBULATORY_CARE_PROVIDER_SITE_OTHER): Payer: Medicare HMO | Admitting: Family Medicine

## 2020-09-20 ENCOUNTER — Ambulatory Visit (INDEPENDENT_AMBULATORY_CARE_PROVIDER_SITE_OTHER): Payer: Medicare HMO

## 2020-09-20 ENCOUNTER — Ambulatory Visit
Admission: RE | Admit: 2020-09-20 | Discharge: 2020-09-20 | Disposition: A | Discharge status: Home / Self Care | Payer: Medicare HMO | Source: Ambulatory Visit | Attending: Family Medicine | Admitting: Family Medicine

## 2020-09-20 ENCOUNTER — Other Ambulatory Visit: Payer: Self-pay

## 2020-09-20 ENCOUNTER — Encounter: Payer: Self-pay | Admitting: Family Medicine

## 2020-09-20 DIAGNOSIS — M5441 Lumbago with sciatica, right side: Secondary | ICD-10-CM

## 2020-09-20 DIAGNOSIS — G8929 Other chronic pain: Secondary | ICD-10-CM

## 2020-09-20 DIAGNOSIS — Z23 Encounter for immunization: Secondary | ICD-10-CM | POA: Diagnosis not present

## 2020-09-20 DIAGNOSIS — M5442 Lumbago with sciatica, left side: Secondary | ICD-10-CM

## 2020-09-20 DIAGNOSIS — M47816 Spondylosis without myelopathy or radiculopathy, lumbar region: Secondary | ICD-10-CM | POA: Diagnosis not present

## 2020-09-20 DIAGNOSIS — M545 Low back pain, unspecified: Secondary | ICD-10-CM | POA: Diagnosis not present

## 2020-09-20 NOTE — Patient Instructions (Signed)
It was great seeing you today! The cause of your back pain.   As discusssed, continue taking Tylenol arthritis as needed for pain. Stop by [facility] to get your x-ray.  You may need additional imaging to determine the cause of your pain.     I will call you after your XR to discuss next steps.     Take care,   [facility]

## 2020-09-20 NOTE — Progress Notes (Cosign Needed)
   SUBJECTIVE:   CHIEF COMPLAINT / HPI:   Chief Complaint  Patient presents with   Back pain f/u     [name] [name] is a 80 y.o. female here for back pain follow up. Pt continues to work at the daycare. She reports ongoing lower back pain. Takes Tylenol arthritis with some relief. She has been having to use a grocery cart to help her in the grocery store. Pain is worsening as it has begun to radiate into her legs. Patient presents with complaint of back pain.  Denies fevers, weight loss, bladder or bowel changes, saddle paraesthesia and weakness. She did not get previous xray as she forgot to go and get it.    PERTINENT  PMH / PSH: reviewed and updated as appropriate   OBJECTIVE:   BP 129/67   Pulse 60   Ht 5\' 3"  (1.6 m)   Wt 183 lb (83 kg)   SpO2 97%   BMI 32.42 kg/m    GEN: pleasant elderly female, in no acute distress  CV: regular rate and rhythm RESP: no increased work of breathing, clear to ascultation bilaterally ABD: Bowel sounds present. Soft, Nontender, Nondistended.  MSK: atraumatic extremities,  Lumbar spine: - Inspection: no gross deformity or asymmetry, swelling or ecchymosis. No skin changes - Palpation: No TTP over the spinous processes, paraspinal muscles, or SI joints b/l - ROM: limited active ROM of the lumbar spine in flexion and extension 2/2 pain - Strength: 5/5 strength of lower extremity in L4-S1 nerve root distributions b/l - Neuro: sensation intact in the L4-S1 nerve root distribution b/l, 2+ L4 and S1 reflexes SKIN: warm, dry NEURO: gross sensation intact (see MSK exam), alert and oriented x4  PSYCH: Normal affect, appropriate speech and behavior    ASSESSMENT/PLAN:   Low back pain DDX not limited to : spinal stenosis, lumbar radiculopathy, piriformis syndrome, sciatica, tumor, caudia equina, osteoarthritis, lumbar strain   Pt is a 80 yo female with ongoing low back pain. Obtain lumbar xray. Consider MRI given evolving change in symptoms and  concern for spinal stenosis. Previous lumbar xray in Aug 2020 showed degenerative disc and facet disease with grade 1 anterolisthesis at L3-4. Continue Tylenol as needed. Follow up in 2-4 weeks.      Sep 2020, DO PGY-3, [facility] 09/20/2020

## 2020-09-22 NOTE — Assessment & Plan Note (Addendum)
DDX not limited to : spinal stenosis, lumbar radiculopathy, piriformis syndrome, sciatica, tumor, caudia equina, osteoarthritis, lumbar strain   Chronic and uncontrolled.  Pt is a 80 yo female with ongoing low back pain. Obtain lumbar xray. Consider MRI given evolving change in symptoms and concern for spinal stenosis. Previous lumbar xray in Aug 2020 showed degenerative disc and facet disease with grade 1 anterolisthesis at L3-4. Continue Tylenol as needed. Follow up in 2-4 weeks.

## 2020-09-25 ENCOUNTER — Other Ambulatory Visit: Payer: Self-pay | Admitting: Family Medicine

## 2020-09-25 DIAGNOSIS — G8929 Other chronic pain: Secondary | ICD-10-CM

## 2020-09-27 ENCOUNTER — Telehealth: Payer: Self-pay

## 2020-09-27 NOTE — Telephone Encounter (Signed)
LVM to have pt call back to schedule AWV.   RE: confirm insurance and schedule AWV on my schedule if times are convenient for patient or other AWV schedule as template permits.   

## 2020-10-05 ENCOUNTER — Telehealth: Payer: Self-pay

## 2020-10-05 NOTE — Telephone Encounter (Signed)
Spoke with Dashia with [facility] stated that someone from the office will call patient to go over screeing questions for MRI. Aquilla Solian, CMA

## 2020-10-05 NOTE — Telephone Encounter (Signed)
-----   Message from Henri Medal, [place] sent at 09/26/2020 10:41 AM EDT ----- Regarding: MRI Hi patients MRI has been approved.  Please call [facility] to schedule.  Auth# [id number] from 09/27/20-10/27/2020.  Jazmin Hartsell,CMA

## 2020-10-06 ENCOUNTER — Other Ambulatory Visit: Payer: Self-pay | Admitting: *Deleted

## 2020-10-06 ENCOUNTER — Other Ambulatory Visit: Payer: Self-pay | Admitting: Family Medicine

## 2020-10-06 DIAGNOSIS — Z1231 Encounter for screening mammogram for malignant neoplasm of breast: Secondary | ICD-10-CM

## 2020-10-31 NOTE — Telephone Encounter (Signed)
Made 2nd attempt to schedule pt for AWV.   Closing note.  

## 2020-11-03 ENCOUNTER — Other Ambulatory Visit: Payer: Self-pay

## 2020-11-03 ENCOUNTER — Ambulatory Visit
Admission: RE | Admit: 2020-11-03 | Discharge: 2020-11-03 | Disposition: A | Discharge status: Home / Self Care | Payer: Medicare HMO | Source: Ambulatory Visit | Attending: Family Medicine | Admitting: Family Medicine

## 2020-11-03 DIAGNOSIS — M545 Low back pain, unspecified: Secondary | ICD-10-CM | POA: Diagnosis not present

## 2020-11-03 DIAGNOSIS — G8929 Other chronic pain: Secondary | ICD-10-CM

## 2020-11-03 DIAGNOSIS — M48061 Spinal stenosis, lumbar region without neurogenic claudication: Secondary | ICD-10-CM | POA: Diagnosis not present

## 2020-11-03 DIAGNOSIS — M5442 Lumbago with sciatica, left side: Secondary | ICD-10-CM

## 2020-11-06 ENCOUNTER — Telehealth: Payer: Self-pay | Admitting: Family Medicine

## 2020-11-06 ENCOUNTER — Encounter: Payer: Self-pay | Admitting: Family Medicine

## 2020-11-06 DIAGNOSIS — M4800 Spinal stenosis, site unspecified: Secondary | ICD-10-CM

## 2020-11-06 DIAGNOSIS — H401132 Primary open-angle glaucoma, bilateral, moderate stage: Secondary | ICD-10-CM | POA: Diagnosis not present

## 2020-11-06 NOTE — Telephone Encounter (Signed)
Called pt to discuss MR Lumbar results. Referral placed to neurosurgeon for further evaluation of spinal stenosis and foraminal stenosis.    Katha Cabal, DO PGY-3, [facility] 11/06/2020

## 2020-11-07 NOTE — Telephone Encounter (Signed)
Spoke with patient about her MR Lumbar spine. All questions asked and answered. She is unsure if she wants to pursue surgery at this time but is willing to speak with the neurosurgeon.   Katha Cabal, DO PGY-3,  Family Medicine 11/07/2020

## 2020-11-07 NOTE — Telephone Encounter (Signed)
Routed message to PCP. Lary Eckardt, CMA  

## 2020-11-07 NOTE — Telephone Encounter (Signed)
Patient is returning Dr. Johnette Abraham call. She would like for her to call back to discuss results.   The best call back is her cell phone [phone]

## 2020-11-27 ENCOUNTER — Ambulatory Visit: Payer: Medicare HMO

## 2020-11-28 ENCOUNTER — Ambulatory Visit
Admission: RE | Admit: 2020-11-28 | Discharge: 2020-11-28 | Disposition: A | Discharge status: Home / Self Care | Payer: Medicare HMO | Source: Ambulatory Visit | Attending: *Deleted | Admitting: *Deleted

## 2020-11-28 ENCOUNTER — Other Ambulatory Visit: Payer: Self-pay

## 2020-11-28 DIAGNOSIS — Z1231 Encounter for screening mammogram for malignant neoplasm of breast: Secondary | ICD-10-CM | POA: Diagnosis not present

## 2020-12-01 ENCOUNTER — Other Ambulatory Visit: Payer: Self-pay

## 2020-12-01 MED ORDER — FERROUS SULFATE 325 (65 FE) MG PO TABS: 325.0000 mg | ORAL_TABLET | Freq: Every day | ORAL | 1 refills | Status: DC

## 2021-03-22 DIAGNOSIS — H401132 Primary open-angle glaucoma, bilateral, moderate stage: Secondary | ICD-10-CM | POA: Diagnosis not present

## 2021-04-05 ENCOUNTER — Ambulatory Visit
Admission: EM | Admit: 2021-04-05 | Discharge: 2021-04-05 | Disposition: A | Discharge status: Home / Self Care | Payer: Medicare HMO | Attending: Physician Assistant | Admitting: Physician Assistant

## 2021-04-05 ENCOUNTER — Encounter: Payer: Self-pay | Admitting: Emergency Medicine

## 2021-04-05 ENCOUNTER — Other Ambulatory Visit: Payer: Self-pay

## 2021-04-05 DIAGNOSIS — M545 Low back pain, unspecified: Secondary | ICD-10-CM | POA: Diagnosis not present

## 2021-04-05 MED ORDER — METHYLPREDNISOLONE SODIUM SUCC 125 MG IJ SOLR
80.0000 mg | Freq: Once | INTRAMUSCULAR | Status: AC
Start: 2021-04-05 — End: 2021-04-05
  Administered 2021-04-05: 80 mg via INTRAMUSCULAR

## 2021-04-05 MED ORDER — CYCLOBENZAPRINE HCL 10 MG PO TABS: 10.0000 mg | ORAL_TABLET | Freq: Two times a day (BID) | ORAL | 0 refills | Status: DC | PRN

## 2021-04-05 NOTE — ED Provider Notes (Signed)
[facility]    CSN: [id number] Arrival date & time: 04/05/21  1321      History   Chief Complaint Chief Complaint  Patient presents with   Back Pain    HPI [name] [name] is a 81 y.o. female.   Patient here today for evaluation of suspected pulled muscle in her left lower back. She had similar occur about a year ago. She notes that she was picking up a child at work and pulled a muscle. She has not had any numbness. Movement makes pain worse. She has been using tylenol and a back brace with some relief.   The history is provided by the patient.  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   Decreased hearing 01/05/2016   Anemia 01/05/2016   Heart murmur 06/14/2015   Mitral regurgitation 05/03/2015   Total knee replacement status 01/04/2014   Low back pain 10/12/2012   Osteoarthritis, knee 08/27/2011   ACE-inhibitor cough 11/07/2010   MURMUR 02/15/2010   Iron deficiency anemia 04/18/2009   Essential hypertension 04/07/2009   DIVERTICULOSIS, SIGMOID COLON 03/09/2009   Obesity (BMI 30.0-34.9) 12/14/2008   History of tobacco use 06/03/2006    Past Surgical History:  Procedure Laterality Date   CESAREAN SECTION     x2   colonscopy      times 2   Internal hemorrhoids     JOINT REPLACEMENT  2011   left knee replacement   KNEE ARTHROPLASTY  04/19/10   Dr Berenice Primas   Sigmoid Diverticulosis     TOTAL KNEE ARTHROPLASTY Right 01/04/2014   Procedure: RIGHT TOTAL KNEE ARTHROPLASTY;  Surgeon: Tobi Bastos, MD;  Location: [facility];  Service: Orthopedics;  Laterality: Right;   TUBAL LIGATION  1978    OB History   No obstetric history on file.      Home Medications    Prior to Admission medications   Medication Sig Start Date End Date Taking? Authorizing Provider  cyclobenzaprine (FLEXERIL) 10 MG tablet Take 1 tablet (10 mg total) by mouth 2 (two) times daily as needed for muscle spasms. 04/05/21  Yes Francene Finders, PA-C  aspirin 81 MG tablet Take 81 mg by mouth daily.    [provider]  ferrous sulfate 325 (65 FE) MG tablet Take 1 tablet (325 mg total) by mouth daily. 12/01/20   Lyndee Hensen, DO  losartan-hydrochlorothiazide (HYZAAR) 50-12.5 MG tablet TAKE 1 TABLET BY MOUTH EVERY DAY 08/24/20   Brimage, Vondra, DO  polyethylene glycol (MIRALAX / GLYCOLAX) 17 g packet Take 17 g by mouth daily.    [provider]  timolol (TIMOPTIC) 0.5 % ophthalmic solution APPLY 1 DROP(S) IN BOTH EYES ONCE A DAY 05/04/16   [provider]  VENTOLIN HFA 108 (90 Base) MCG/ACT inhaler TAKE 2 PUFFS BY MOUTH EVERY 6 HOURS AS NEEDED 11/25/17   Jacelyn Pi, Lilia Argue, MD    Family History Family History  Problem Relation Age of Onset   Hypertension Father  Stroke Father    Prostate cancer Brother    Breast cancer Neg Hx     Social History Social History   Tobacco Use   Smoking status: Former    Packs/day: 1.00    Years: 33.00    Pack years: 33.00    Types: Cigarettes    Quit date: 02/25/2009    Years since quitting: 12.1   Smokeless tobacco: Never  Vaping Use   Vaping Use: Never used  Substance Use Topics   Alcohol use: No   Drug use: No     Allergies   Patient has no known allergies.   Review of Systems Review of Systems  Constitutional:  Negative for chills and fever.  Eyes:  Negative for discharge and redness.  Gastrointestinal:  Negative for abdominal pain, nausea and vomiting.  Genitourinary:  Negative for dysuria.  Musculoskeletal:  Positive for back pain and myalgias.   Neurological:  Negative for weakness and numbness.    Physical Exam Triage Vital Signs ED Triage Vitals  Enc Vitals Group     BP 04/05/21 1422 (!) 147/73     Pulse Rate 04/05/21 1422 68     Resp 04/05/21 1422 16     Temp 04/05/21 1422 98.1 F (36.7 C)     Temp Source 04/05/21 1422 Oral     SpO2 04/05/21 1422 94 %     Weight --      Height --      Head Circumference --      Peak Flow --      Pain Score 04/05/21 1424 5     Pain Loc --      Pain Edu? --      Excl. in [place]? --    No data found.  Updated Vital Signs BP (!) 147/73 (BP Location: Right Arm)    Pulse 68    Temp 98.1 F (36.7 C) (Oral)    Resp 16    SpO2 94%   Physical Exam Vitals and nursing note reviewed.  Constitutional:      General: She is not in acute distress.    Appearance: Normal appearance. She is not ill-appearing.  HENT:     Head: Normocephalic and atraumatic.  Eyes:     Conjunctiva/sclera: Conjunctivae normal.  Cardiovascular:     Rate and Rhythm: Normal rate.  Pulmonary:     Effort: Pulmonary effort is normal.  Musculoskeletal:     Comments: Mild TTP to left lower back  Neurological:     Mental Status: She is alert.  Psychiatric:        Mood and Affect: Mood normal.        Behavior: Behavior normal.        Thought Content: Thought content normal.     UC Treatments / Results  Labs (all labs ordered are listed, but only abnormal results are displayed) Labs Reviewed - No data to display  EKG   Radiology No results found.  Procedures Procedures (including critical care time)  Medications Ordered in UC Medications  methylPREDNISolone sodium succinate (SOLU-MEDROL) 125 mg/2 mL injection 80 mg (80 mg Intramuscular Given 04/05/21 1450)    Initial Impression / Assessment and Plan / UC Course  I have reviewed the triage vital signs and the nursing notes.  Pertinent labs & imaging results that were available during my care of the patient were reviewed by me and considered in my  medical decision making (see chart for details).    Will treat with steroid  injection and muscle relaxer that she has been prescribed in the past. Encouraged follow up with any further concerns or if symptoms do not improve.  Final Clinical Impressions(s) / UC Diagnoses   Final diagnoses:  Acute left-sided low back pain without sciatica   Discharge Instructions   None    ED Prescriptions     Medication Sig Dispense Auth. Provider   cyclobenzaprine (FLEXERIL) 10 MG tablet Take 1 tablet (10 mg total) by mouth 2 (two) times daily as needed for muscle spasms. 20 tablet Francene Finders, PA-C      PDMP not reviewed this encounter.   Francene Finders, PA-C 04/05/21 1454

## 2021-04-05 NOTE — ED Triage Notes (Signed)
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 States this happened to her last year and improved with a muscle relaxer. Has been taking tylenol and wearing a back brace without improvement. States the pain happened after lifting a small child the wrong way

## 2021-05-17 DIAGNOSIS — M47816 Spondylosis without myelopathy or radiculopathy, lumbar region: Secondary | ICD-10-CM | POA: Diagnosis not present

## 2021-05-22 ENCOUNTER — Other Ambulatory Visit: Payer: Self-pay | Admitting: Family Medicine

## 2021-08-16 ENCOUNTER — Other Ambulatory Visit: Payer: Self-pay | Admitting: Family Medicine

## 2021-08-16 DIAGNOSIS — I10 Essential (primary) hypertension: Secondary | ICD-10-CM

## 2021-08-22 DIAGNOSIS — H401132 Primary open-angle glaucoma, bilateral, moderate stage: Secondary | ICD-10-CM | POA: Diagnosis not present

## 2021-09-24 ENCOUNTER — Ambulatory Visit: Payer: Self-pay | Admitting: Licensed Clinical Social Worker

## 2021-09-24 NOTE — Patient Outreach (Signed)
  Care Coordination   Initial Visit Note   09/24/2021 Name: [name] [name] MRN: [id number] DOB: 1940/07/11  [name] [name] is a 81 y.o. year old female who sees Levin Erp, MD for primary care.   What matters to the patients health and wellness today?  1st unsuccessful outreach . SW reviewed chart to determine any additional needs or barriers. SW will attempt patient again on 08/01.     SDOH assessments and interventions completed:   No   Care Coordination Interventions Activated:  No Care Coordination Interventions:  No, not indicated  Follow up plan: Follow up call scheduled for 09/25/2021  Encounter Outcome:  Pt. Scheduled  Ander Gaster , MSW Social Worker [facility]  [phone]

## 2021-09-26 ENCOUNTER — Other Ambulatory Visit: Payer: Self-pay | Admitting: *Deleted

## 2021-09-26 NOTE — Patient Outreach (Signed)
  Care Coordination   09/26/2021 Name: [name] [name] MRN: [id number] DOB: 03-11-1940   Care Coordination Outreach Attempts:  An unsuccessful telephone outreach was attempted today to offer the patient information about available care coordination services as a benefit of their health plan.   Follow Up Plan:  Additional outreach attempts will be made to offer the patient care coordination information and services.   Encounter Outcome:  No Answer  Care Coordination Interventions Activated:  No   Care Coordination Interventions:  No, not indicated    Blanchie Serve RN, BSN [facility] Care Management [facility] [phone] [email or web address]

## 2021-10-05 ENCOUNTER — Encounter: Payer: Self-pay | Admitting: Student

## 2021-10-05 ENCOUNTER — Ambulatory Visit (INDEPENDENT_AMBULATORY_CARE_PROVIDER_SITE_OTHER): Payer: Medicare HMO | Admitting: Student

## 2021-10-05 VITALS — BP 114/63 | HR 66 | Ht 63.0 in | Wt 181.8 lb

## 2021-10-05 DIAGNOSIS — R0602 Shortness of breath: Secondary | ICD-10-CM

## 2021-10-05 DIAGNOSIS — Z1211 Encounter for screening for malignant neoplasm of colon: Secondary | ICD-10-CM

## 2021-10-05 DIAGNOSIS — I10 Essential (primary) hypertension: Secondary | ICD-10-CM

## 2021-10-05 DIAGNOSIS — D649 Anemia, unspecified: Secondary | ICD-10-CM

## 2021-10-05 DIAGNOSIS — Z23 Encounter for immunization: Secondary | ICD-10-CM

## 2021-10-05 DIAGNOSIS — Z13228 Encounter for screening for other metabolic disorders: Secondary | ICD-10-CM | POA: Diagnosis not present

## 2021-10-05 DIAGNOSIS — Z0001 Encounter for general adult medical examination with abnormal findings: Secondary | ICD-10-CM | POA: Diagnosis not present

## 2021-10-05 MED ORDER — FERROUS SULFATE 325 (65 FE) MG PO TABS: 325.0000 mg | ORAL_TABLET | Freq: Every day | ORAL | 1 refills | Status: DC

## 2021-10-05 MED ORDER — ALBUTEROL SULFATE HFA 108 (90 BASE) MCG/ACT IN AERS: INHALATION_SPRAY | RESPIRATORY_TRACT | 1 refills | Status: AC

## 2021-10-05 MED ORDER — LOSARTAN POTASSIUM-HCTZ 50-12.5 MG PO TABS: 0.5000 | ORAL_TABLET | Freq: Every day | ORAL | 3 refills | Status: DC

## 2021-10-05 MED ORDER — ZOSTER VAC RECOMB ADJUVANTED 50 MCG/0.5ML IM SUSR: 0.5000 mL | Freq: Once | INTRAMUSCULAR | 0 refills | Status: AC

## 2021-10-05 NOTE — Patient Instructions (Signed)
It was great to see you! Thank you for allowing me to participate in your care!   I recommend that you always bring your medications to each appointment as this makes it easy to ensure we are on the correct medications and helps [place] not miss when refills are needed.  Our plans for today:  -I have sent in Shingrix vaccine and you will get your Prevnar 20 here -please split your hyzaar tablet in two with a pill splitter and we will follow up in 1 month -We discussed options for colon cancer screening and risk and benefits of this -Please take your iron pill  We are checking some labs today, I will call you if they are abnormal will send you a MyChart message or a letter if they are normal.  If you do not hear about your labs in the next 2 weeks please let us know.  Take care and seek immediate care sooner if you develop any concerns. Please remember to show up 15 minutes before your scheduled appointment time!  Levin Erp, MD [facility]

## 2021-10-05 NOTE — Assessment & Plan Note (Signed)
CBC recheck Reordered ferrous sulfate

## 2021-10-05 NOTE — Progress Notes (Signed)
    SUBJECTIVE:   Chief compliant/HPI: annual examination  [name] [name] is a 81 y.o. who presents today for an annual exam.   HTN Hyzaar 50-12.5 mg tablet daily. Sometimes when stand up gets dizzy. Denies any headaches or chest pain   Anemia Ferrous sulfate 325 daily has not been taking, denies any hematochezia or hematemesis.  Cervical  Shock up to head only when laying down for years. Aspirin helps that she says.  She denies any numbness or tingling and no pain or shocklike sensation when flexing neck.  Health maintenance Has gotten pneumonia 13 and 23 injections and would like to 20 today.  She would like her shingles second shot sent to [facility]. Covid shot she is going to obtain at [facility] We discussed colonoscopy/stool testing and she would like to do stool testing.  She is not smoking anymore.  History tabs reviewed and updated.    OBJECTIVE:   BP 114/63   Pulse 66   Ht 5\' 3"  (1.6 m)   Wt 181 lb 12.8 oz (82.5 kg)   SpO2 99%   BMI 32.20 kg/m   General: Well appearing, NAD, awake, alert, responsive to questions Head: Normocephalic atraumatic, ROM intact for neck, no pain or sensation issue son examination, neck flexion without Lhermitte's sign CV: Regular rate and rhythm  Respiratory: Clear to ausculation bilaterally, chest rises symmetrically,  no increased work of breathing Abdomen: Soft, non-tender, non-distended, normoactive bowel sounds  Extremities: Moves upper and lower extremities freely, no edema in LE  ASSESSMENT/PLAN:   Annual Examination  See AVS for age appropriate recommendations  PHQ score  Flowsheet Row Office Visit from 10/05/2021 in [facility]  PHQ-9 Total Score 7      , reviewed and discussed.  Essential hypertension BP reviewed and having some orthostatic hypotension/dizziness when standing up> plan to decrease Hyzaar 50-12.5 by half discussed patient getting pill splitter and splitting pill and taking one half  daily and following up in 1 month for blood pressure recheck.  -BMP -Lipid panel  Anemia CBC recheck Reordered ferrous sulfate  Advance directives discussion patient given form in office today 30 pack year smoking history-former smoker, low dose CT last performed 2017 Vaccinations sent and shingles, Prevnar 20 given, Cologuard sent Follows with [facility], has mammogram upcoming  Follow up in 1 month for blood pressure recheck  [facility], MD [facility]

## 2021-10-05 NOTE — Assessment & Plan Note (Addendum)
BP reviewed and having some orthostatic hypotension/dizziness when standing up> plan to decrease Hyzaar 50-12.5 by half discussed patient getting pill splitter and splitting pill and taking one half daily and following up in 1 month for blood pressure recheck.  -BMP -Lipid panel

## 2021-10-06 LAB — LIPID PANEL
Chol/HDL Ratio: 3.5 ratio (ref 0.0–4.4)
Cholesterol, Total: 173 mg/dL (ref 100–199)
HDL: 49 mg/dL (ref >39)
LDL Chol Calc (NIH): 104 mg/dL — ABNORMAL HIGH (ref 0–99)
Triglycerides: 109 mg/dL (ref 0–149)
VLDL Cholesterol Cal: 20 mg/dL (ref 5–40)

## 2021-10-06 LAB — CBC
Hematocrit: 37.8 % (ref 34.0–46.6)
Hemoglobin: 11.8 g/dL (ref 11.1–15.9)
MCH: 24.3 pg — ABNORMAL LOW (ref 26.6–33.0)
MCHC: 31.2 g/dL — ABNORMAL LOW (ref 31.5–35.7)
MCV: 78 fL — ABNORMAL LOW (ref 79–97)
Platelets: 201 10*3/uL (ref 150–450)
RBC: 4.85 x10E6/uL (ref 3.77–5.28)
RDW: 14.3 % (ref 11.7–15.4)
WBC: 8.4 10*3/uL (ref 3.4–10.8)

## 2021-10-06 LAB — BASIC METABOLIC PANEL
BUN/Creatinine Ratio: 21 (ref 12–28)
BUN: 27 mg/dL (ref 8–27)
CO2: 24 mmol/L (ref 20–29)
Calcium: 9.3 mg/dL (ref 8.7–10.3)
Chloride: 103 mmol/L (ref 96–106)
Creatinine, Ser: 1.29 mg/dL — ABNORMAL HIGH (ref 0.57–1.00)
Glucose: 95 mg/dL (ref 70–99)
Potassium: 4.6 mmol/L (ref 3.5–5.2)
Sodium: 141 mmol/L (ref 134–144)
eGFR: 42 mL/min/{1.73_m2} — ABNORMAL LOW (ref >59)

## 2021-10-08 ENCOUNTER — Encounter: Payer: Self-pay | Admitting: Student

## 2021-10-27 DIAGNOSIS — Z1211 Encounter for screening for malignant neoplasm of colon: Secondary | ICD-10-CM | POA: Diagnosis not present

## 2021-11-03 LAB — COLOGUARD: COLOGUARD: NEGATIVE

## 2021-11-13 ENCOUNTER — Encounter: Payer: Self-pay | Admitting: Student

## 2021-11-13 ENCOUNTER — Ambulatory Visit (HOSPITAL_COMMUNITY)
Admission: RE | Admit: 2021-11-13 | Discharge: 2021-11-13 | Disposition: A | Discharge status: Home / Self Care | Payer: Medicare HMO | Source: Ambulatory Visit | Attending: Family Medicine | Admitting: Family Medicine

## 2021-11-13 ENCOUNTER — Ambulatory Visit (INDEPENDENT_AMBULATORY_CARE_PROVIDER_SITE_OTHER): Payer: Medicare HMO | Admitting: Student

## 2021-11-13 VITALS — BP 140/74 | HR 73 | Ht 62.0 in | Wt 181.2 lb

## 2021-11-13 DIAGNOSIS — R0609 Other forms of dyspnea: Secondary | ICD-10-CM | POA: Diagnosis not present

## 2021-11-13 DIAGNOSIS — B353 Tinea pedis: Secondary | ICD-10-CM | POA: Diagnosis not present

## 2021-11-13 DIAGNOSIS — Z23 Encounter for immunization: Secondary | ICD-10-CM | POA: Diagnosis not present

## 2021-11-13 DIAGNOSIS — N3941 Urge incontinence: Secondary | ICD-10-CM

## 2021-11-13 DIAGNOSIS — I10 Essential (primary) hypertension: Secondary | ICD-10-CM | POA: Diagnosis not present

## 2021-11-13 DIAGNOSIS — J449 Chronic obstructive pulmonary disease, unspecified: Secondary | ICD-10-CM | POA: Insufficient documentation

## 2021-11-13 DIAGNOSIS — R002 Palpitations: Secondary | ICD-10-CM

## 2021-11-13 MED ORDER — CLOTRIMAZOLE 1 % EX CREA: 1.0000 | TOPICAL_CREAM | Freq: Two times a day (BID) | CUTANEOUS | 0 refills | Status: DC

## 2021-11-13 NOTE — Assessment & Plan Note (Signed)
BP 140/74 on recheck however patient is no longer dizzy. -Continue Hyzaar 25-6 0.25 -Monitor visits

## 2021-11-13 NOTE — Assessment & Plan Note (Signed)
Patient has had exertional dyspnea for very long time.  She says she takes albuterol for this at least once a day.  It looks like this was started by an urgent care and was referred to pulmonology at that time but no formal PFTs or pulmonology appointment in epic.  Has history of smoking.  No lower extremity edema on examination. -Pharmacy referral for PFTs

## 2021-11-13 NOTE — Assessment & Plan Note (Signed)
Patient having itching of feet bilaterally and has been trying hydrocortisone over-the-counter for this but continues to have itching.  No obvious issues on examination. -Trial Lotrimin on feet twice daily and stop hydrocortisone -Monitor

## 2021-11-13 NOTE — Patient Instructions (Addendum)
It was great to see you! Thank you for allowing me to participate in your care!   Our plans for today:  - Continue taking 1/2 of your BP pill as you are no longer dizzy - We checked an EKG today and it showed PAC's or benign extra heart beats, if this increases we can do additional testing - Please stop doing hydrocortisone on your feet and start doing clotrimazole lotion twice daily to help with itching.  Sure you are changing your socks daily and letting your feet air out at times - I am going to refer you to our pharmacy doctor to test your lung function and to see whether you need to be on a different inhaler  Take care and seek immediate care sooner if you develop any concerns.  Gerrit Heck, MD

## 2021-11-13 NOTE — Assessment & Plan Note (Signed)
Handout about Kegels/strengthening pelvic floor discussed

## 2021-11-13 NOTE — Assessment & Plan Note (Signed)
Intermittently has heart fluttering when walking.  EKG performed today which showed a couple of PACs.  Most likely cause of this.  No recent echo in chart.  Had a recent BMP and CMP which were within normal limits.  We discussed that thyroid testing is possible as well but will defer today.  -If worsening can consider Zio patch/cardiology referral -Consider TSH at future visit if continuing

## 2021-11-13 NOTE — Progress Notes (Signed)
    SUBJECTIVE:   CHIEF COMPLAINT / HPI:   Hypertension: Patient is a 81 y.o. female who present today for follow up of hypertension.   Patient endorses no problems  Home medications include: Hyzaar 25-6.25 Patient endorses taking these medications as prescribed. Denies any headache, vision changes, shortness of breath, lower extremity swelling or chest pain   Most recent creatinine trend:  Lab Results  Component Value Date   CREATININE 1.29 (H) 10/05/2021   CREATININE 1.24 (H) 08/22/2020   CREATININE 1.10 (H) 11/18/2019   Patient does not check blood pressure at home.  Patient has had a BMP in the past 1 year.  Foot Itching Using hydrocortisone  Heart Flutters  Exertional Dyspnea Heart feels like it is fluttering when walking. Short of breath when walking and takes albuterol for this. No swelling in legs. Lays up when going to sleep but does not feel like it is because she cannot sleep laying down but more of a habit. No chest pain. Hx of smoking 30 + pack years per chart review.  Was prescribed albuterol in 2018 by urgent care.  No formal PFTs in system.  Urinary Urge Incontinence Urge to go and can't make it to bathroom in time sometimes.  No saddle anesthesia.  Has had history of vaginal deliveries and 1 C-section.  Has 4 children.  PERTINENT  PMH / PSH:   OBJECTIVE:   BP (!) 140/74   Pulse 73   Ht 5\' 2"  (1.575 m)   Wt 181 lb 3.2 oz (82.2 kg)   SpO2 98%   BMI 33.14 kg/m   General: NAD, awake, alert, responsive to questions Head: Normocephalic atraumatic CV: Regular rate and rhythm no murmurs rubs or gallops Respiratory: Clear to ausculation bilaterally, no wheezes rales or crackles, chest rises symmetrically,  no increased work of breathing Abdomen: Soft, non-tender, non-distended Extremities: Moves upper and lower extremities freely, no edema in LE; feet without obvious lesions/ulcers  ASSESSMENT/PLAN:   Essential hypertension BP 140/74 on recheck however  patient is no longer dizzy. -Continue Hyzaar 25-6 0.25 -Monitor visits  Intermittent palpitations Intermittently has heart fluttering when walking.  EKG performed today which showed a couple of PACs.  Most likely cause of this.  No recent echo in chart.  Had a recent BMP and CMP which were within normal limits.  We discussed that thyroid testing is possible as well but will defer today.  -If worsening can consider Zio patch/cardiology referral -Consider TSH at future visit if continuing  Urge incontinence Handout about Kegels/strengthening pelvic floor discussed  Exertional dyspnea Patient has had exertional dyspnea for very long time.  She says she takes albuterol for this at least once a day.  It looks like this was started by an urgent care and was referred to pulmonology at that time but no formal PFTs or pulmonology appointment in epic.  Has history of smoking.  No lower extremity edema on examination. -Pharmacy referral for PFTs  Tinea pedis of both feet Patient having itching of feet bilaterally and has been trying hydrocortisone over-the-counter for this but continues to have itching.  No obvious issues on examination. -Trial Lotrimin on feet twice daily and stop hydrocortisone -Monitor    Gerrit Heck, MD [place]

## 2021-11-14 ENCOUNTER — Other Ambulatory Visit: Payer: Self-pay | Admitting: Student

## 2021-11-14 DIAGNOSIS — Z1231 Encounter for screening mammogram for malignant neoplasm of breast: Secondary | ICD-10-CM

## 2021-11-29 ENCOUNTER — Ambulatory Visit (HOSPITAL_BASED_OUTPATIENT_CLINIC_OR_DEPARTMENT_OTHER): Payer: Medicare HMO | Admitting: Pharmacist

## 2021-11-29 ENCOUNTER — Encounter: Payer: Self-pay | Admitting: Pharmacist

## 2021-11-29 VITALS — Ht 63.5 in | Wt 180.0 lb

## 2021-11-29 DIAGNOSIS — R0602 Shortness of breath: Secondary | ICD-10-CM | POA: Diagnosis not present

## 2021-11-29 DIAGNOSIS — J449 Chronic obstructive pulmonary disease, unspecified: Secondary | ICD-10-CM | POA: Diagnosis not present

## 2021-11-29 MED ORDER — STIOLTO RESPIMAT 2.5-2.5 MCG/ACT IN AERS: 2.0000 | INHALATION_SPRAY | Freq: Every day | RESPIRATORY_TRACT | 11 refills | Status: DC

## 2021-11-29 NOTE — Assessment & Plan Note (Signed)
Patient has been experiencing occasional shortness of breath for many months and taking albuterol multiple times a week. Medication adherence appropriate. 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 Post nebulized albuterol tx revealed moderate obstructive lung disease without reversibility.  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 - begin Stiolto (olodaterol and tiotropium) Respimat 2 puffs daily  - Educated patient on purpose, proper use, and potential adverse effects. - Continue albuterol inhaler as needed.  -Reviewed results of pulmonary function tests. Patient verbalized understanding of treatment plan.

## 2021-11-29 NOTE — Progress Notes (Signed)
Reviewed: I agree with Dr. Koval's documentation and management. 

## 2021-11-29 NOTE — Patient Instructions (Addendum)
It was nice to see you today!  Medication Changes: Begin Stiolto 2 puffs once daily Continue albuterol as needed.   Follow up 01/04/2022 at 10:10 AM with Dr. Jinny Sanders.

## 2021-11-29 NOTE — Progress Notes (Signed)
   S:     Chief Complaint  Patient presents with   Medication Management    PFT evaluation   [name] [name] is a 81 y.o. female who presents for lung function evaluation.  PMH is significant for hypertension.  Patient was referred and last seen by Primary Care Provider, Dr. Jinny Sanders, on 11/13/2021. Patient was referred by Dr. Jinny Sanders, on 11/13/2021 for PFT.  Patient reports breathing is 10/10 today. Patient denies atopic sx consistent with atopic dermatitis, allergic rhinitis. Patient works with babies at a daycare.   Patient is a former smoker: began smoking at age 8 and quit 10-15 years ago. Smoked 1.5 PPD.  Pack-Year History of > 50 years.   Patient reports adherence to medications. Current asthma medications: albuterol PRN, last dose yesterday (11/28/2021) evening Rescue inhaler use frequency: approximately 3 times in a week  Level of asthma sx control- in the last 4 weeks: Question Scoring Patient Score  Daytime sx > 2x/week Yes (1)   No (0) 0  Any nighttime waking due to asthma Yes (1)   No (0) 0  Reliever needed >2x/week Yes (1)   No (0) 1  Any activity limitation due to asthma Yes (1)   No (0) 0   Total Score 1  Well controlled - 0, Partly controlled - 1-2, Uncontrolled 3-4  CAT Score 8  O: Review of Systems  Respiratory:  Negative for cough, sputum production and shortness of breath.   All other systems reviewed and are negative.   Physical Exam Constitutional:      Appearance: Normal appearance.  Pulmonary:     Effort: Pulmonary effort is normal. No respiratory distress.  Neurological:     Mental Status: She is alert.  Psychiatric:        Mood and Affect: Mood normal.        Behavior: Behavior normal.        Thought Content: Thought content normal.        Judgment: Judgment normal.    See "scanned report" or Documentation Flowsheet (discrete results - PFTs) for  Spirometry results. Patient provided good effort while attempting spirometry.   Lung Age =  100 Albuterol Neb  Lot# [id number]     Exp. Feb 2025  A/P: Patient has been experiencing occasional shortness of breath for many months and taking albuterol multiple times a week. Medication adherence appropriate. 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 Post nebulized albuterol tx revealed moderate obstructive lung disease without reversibility.  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 - begin Stiolto (olodaterol and tiotropium) Respimat 2 puffs daily   - Educated patient on purpose, proper use, and potential adverse effects. - Continue albuterol inhaler as needed.  -Reviewed results of pulmonary function tests. Patient verbalized understanding of treatment plan.   Written patient instructions provided.  Total time in face to face counseling 50 minutes.    Follow-up:  Pharmacist follow-up as needed. PCP clinic visit in 1 month.  Patient seen with Geraldo Docker, PharmD Candidate, Garrel Ridgel PharmD Candidate.and Joseph Art, PharmD, PGY2 Pharmacy Resident.

## 2021-12-03 ENCOUNTER — Ambulatory Visit
Admission: RE | Admit: 2021-12-03 | Discharge: 2021-12-03 | Disposition: A | Discharge status: Home / Self Care | Payer: Medicare HMO | Source: Ambulatory Visit | Attending: *Deleted | Admitting: *Deleted

## 2021-12-03 DIAGNOSIS — Z1231 Encounter for screening mammogram for malignant neoplasm of breast: Secondary | ICD-10-CM | POA: Diagnosis not present

## 2021-12-24 ENCOUNTER — Telehealth: Payer: Self-pay

## 2021-12-24 NOTE — Patient Outreach (Signed)
  Care Coordination   12/24/2021 Name: [name] [name] MRN: [id number] DOB: 23-Jan-1941   Care Coordination Outreach Attempts:  A third unsuccessful outreach was attempted today to offer the patient with information about available care coordination services as a benefit of their health plan.   Follow Up Plan:  No further outreach attempts will be made at this time. We have been unable to contact the patient to offer or enroll patient in care coordination services  Encounter Outcome:  No Answer  Care Coordination Interventions Activated:  No   Care Coordination Interventions:  No, not indicated    Jone Baseman, RN, MSN [facility] Care Management Coordinator Direct Line [phone]

## 2022-01-04 ENCOUNTER — Ambulatory Visit (INDEPENDENT_AMBULATORY_CARE_PROVIDER_SITE_OTHER): Payer: Medicare HMO | Admitting: Student

## 2022-01-04 ENCOUNTER — Encounter: Payer: Self-pay | Admitting: Student

## 2022-01-04 VITALS — BP 139/68 | HR 66 | Ht 62.0 in | Wt 187.4 lb

## 2022-01-04 DIAGNOSIS — J449 Chronic obstructive pulmonary disease, unspecified: Secondary | ICD-10-CM | POA: Diagnosis not present

## 2022-01-04 MED ORDER — TRELEGY ELLIPTA 100-62.5-25 MCG/ACT IN AEPB
1.0000 | INHALATION_SPRAY | Freq: Every day | RESPIRATORY_TRACT | 1 refills | Status: DC
Start: 2022-01-04 — End: 2023-01-13

## 2022-01-04 NOTE — Progress Notes (Unsigned)
    SUBJECTIVE:   CHIEF COMPLAINT / HPI:   Recently got PFTs which showed moderate obstructive lung disease met stage B Gold criteria.  Was begun on Stiolto Respimat 2 puffs daily.  Continuing her albuterol as needed.  Since starting medication she is felt***.  PERTINENT  PMH / PSH: ***  OBJECTIVE:   BP 139/68   Pulse 66   Ht 5' 2" (1.575 m)   Wt 187 lb 6.4 oz (85 kg)   SpO2 99%   BMI 34.28 kg/m   ***  ASSESSMENT/PLAN:   No problem-specific Assessment & Plan notes found for this encounter.     Mayuri Jagadish, MD [facility]  

## 2022-01-04 NOTE — Patient Instructions (Signed)
It was great to see you! Thank you for allowing me to participate in your care!   Our plans for today:  - I have a different inhaler sample trelegy take 1 puff EVERY day  Take care and seek immediate care sooner if you develop any concerns.  Levin Erp, MD

## 2022-01-05 NOTE — Assessment & Plan Note (Signed)
Patient had recent PFTs that showed moderate COPD. She was prescribed stiolto however was unable to pick this up due to copay. -Trelegy sample given to patient in office (taught patient how to administer) -Trelegy rx sent 100-62.5-25 -Sent message to pharmacy team to see what are best options for patient

## 2022-01-07 ENCOUNTER — Other Ambulatory Visit (HOSPITAL_COMMUNITY): Payer: Self-pay

## 2022-01-16 ENCOUNTER — Telehealth: Payer: Self-pay

## 2022-01-16 ENCOUNTER — Other Ambulatory Visit (HOSPITAL_COMMUNITY): Payer: Self-pay

## 2022-01-16 NOTE — Telephone Encounter (Signed)
Attempted to reach out to patient regarding possible PAP for inhalers (Trelegy or Stiolto).   Looks like Trelegy was filled at [facility] pharmacy 01/04/22. Will reach out again to see if this had a more affordable copay for her than the Stiolto.

## 2022-01-26 DIAGNOSIS — H401132 Primary open-angle glaucoma, bilateral, moderate stage: Secondary | ICD-10-CM | POA: Diagnosis not present

## 2022-01-30 DIAGNOSIS — K219 Gastro-esophageal reflux disease without esophagitis: Secondary | ICD-10-CM | POA: Diagnosis not present

## 2022-01-30 DIAGNOSIS — M5136 Other intervertebral disc degeneration, lumbar region: Secondary | ICD-10-CM | POA: Diagnosis not present

## 2022-01-30 DIAGNOSIS — M4306 Spondylolysis, lumbar region: Secondary | ICD-10-CM | POA: Diagnosis not present

## 2022-01-30 DIAGNOSIS — Z87891 Personal history of nicotine dependence: Secondary | ICD-10-CM | POA: Diagnosis not present

## 2022-01-30 DIAGNOSIS — Z79899 Other long term (current) drug therapy: Secondary | ICD-10-CM | POA: Diagnosis not present

## 2022-01-30 DIAGNOSIS — M5137 Other intervertebral disc degeneration, lumbosacral region: Secondary | ICD-10-CM | POA: Diagnosis not present

## 2022-01-30 DIAGNOSIS — I10 Essential (primary) hypertension: Secondary | ICD-10-CM | POA: Diagnosis not present

## 2022-01-30 DIAGNOSIS — M47816 Spondylosis without myelopathy or radiculopathy, lumbar region: Secondary | ICD-10-CM | POA: Diagnosis not present

## 2022-01-30 DIAGNOSIS — M47896 Other spondylosis, lumbar region: Secondary | ICD-10-CM | POA: Diagnosis not present

## 2022-01-30 DIAGNOSIS — M4316 Spondylolisthesis, lumbar region: Secondary | ICD-10-CM | POA: Diagnosis not present

## 2022-01-31 ENCOUNTER — Other Ambulatory Visit: Payer: Self-pay | Admitting: Student

## 2022-01-31 DIAGNOSIS — B353 Tinea pedis: Secondary | ICD-10-CM

## 2022-02-13 ENCOUNTER — Telehealth: Payer: Self-pay | Admitting: Student

## 2022-02-13 NOTE — Telephone Encounter (Signed)
Left message for patient to call back and schedule Medicare Annual Wellness Visit (AWV) to be done virtually or by telephone.  No hx of AWV  Please schedule at anytime with [facility] Health Ad[facility].      45 Minutes appointment   Any questions, please call me at [phone] 

## 2022-03-12 ENCOUNTER — Telehealth: Payer: Self-pay | Admitting: Student

## 2022-03-12 NOTE — Telephone Encounter (Signed)
Left message for patient to call back and schedule Medicare Annual Wellness Visit (AWV) in office.  ° °If not able to come in office, please offer to do virtually or by telephone.  Left office number and my jabber #[phone]. ° °AWVI eligible as of 02/25/2009 ° °Please schedule at anytime with Nurse Health Advisor. °  °

## 2022-03-27 IMAGING — MR MR LUMBAR SPINE W/O CM
4 of 5 series · 26 of 48 positions shown · non-contrast
Comparison: None.

CLINICAL DATA: Severe low back pain with bilateral leg pain and
weakness for 2 months

EXAM:
MRI LUMBAR SPINE WITHOUT CONTRAST
TECHNIQUE: Multiplanar, multisequence MR imaging of the lumbar spine was
performed. No intravenous contrast was administered.

[Series 3: T2 · sagittal · 4.0mm · 0.53mm/px · 6 of 15 slices shown (1 of 2)]
[im 1/15]
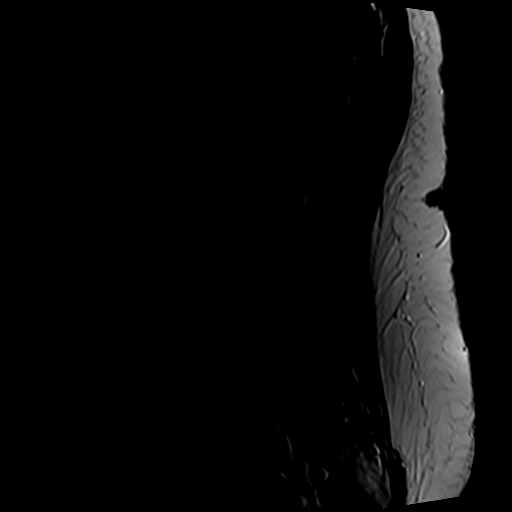
[im 3/15]
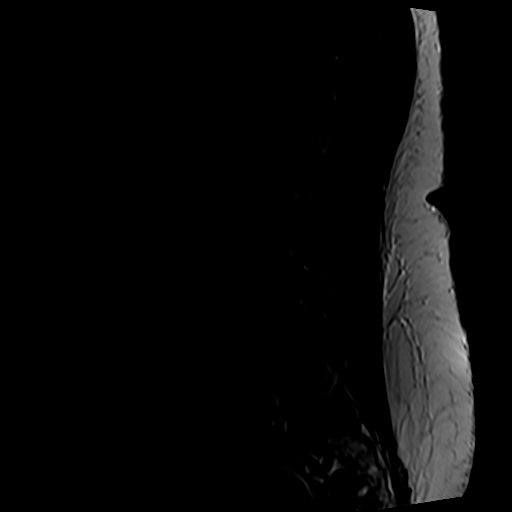
[im 6/15]
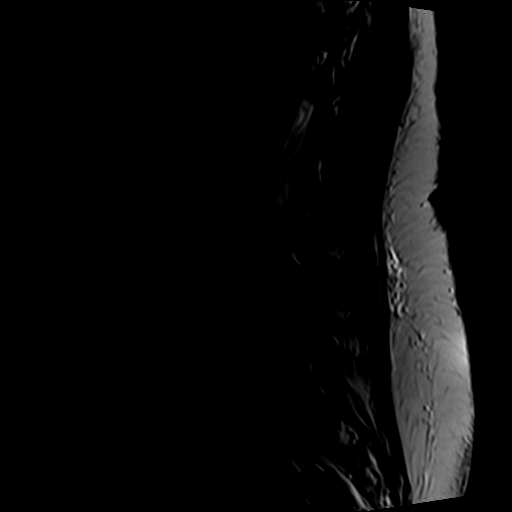
[im 9/15]
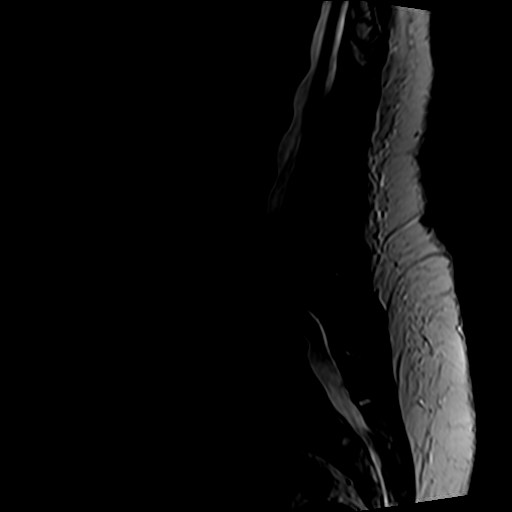
[im 12/15]
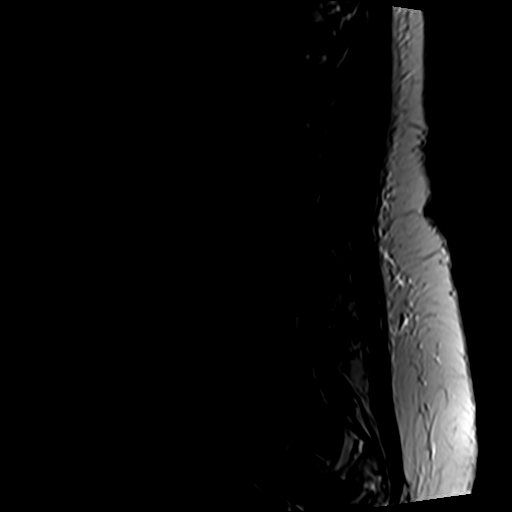
[im 15/15]
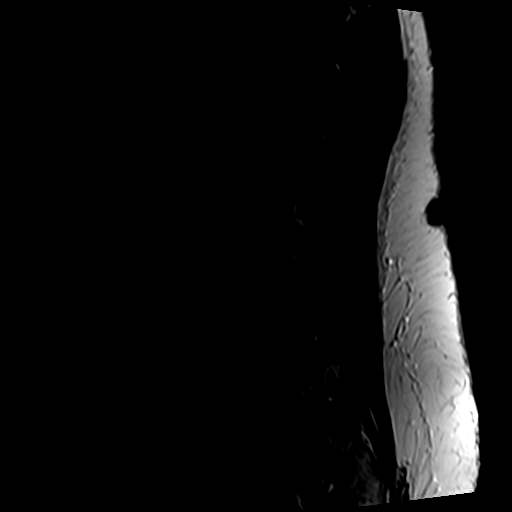

[Series 5: T1 · sagittal · 4.0mm · 0.53mm/px · 6 of 15 slices shown (1 of 2)]
[im 1/15]
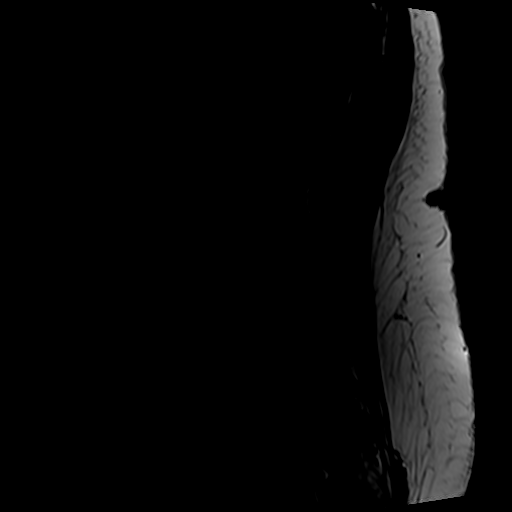
[im 3/15]
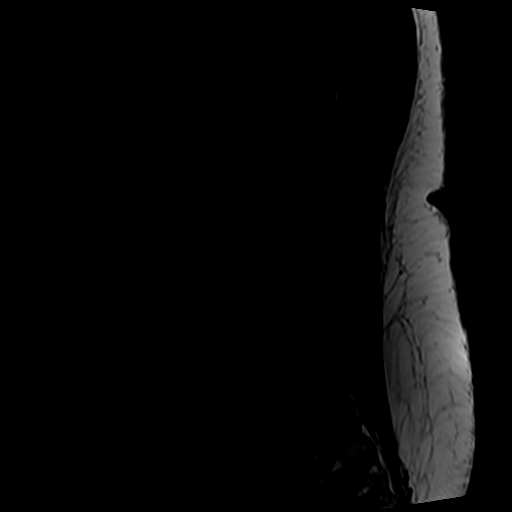
[im 6/15]
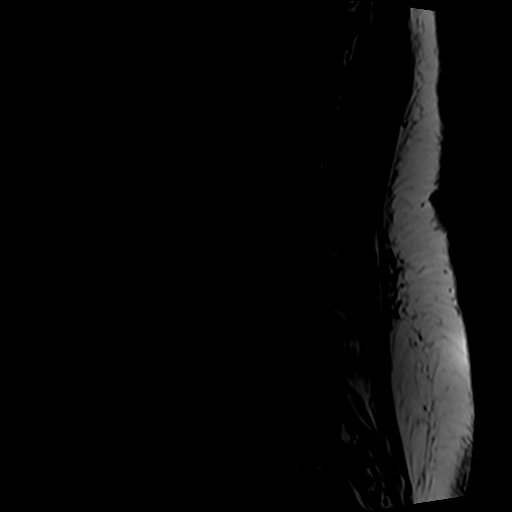
[im 9/15]
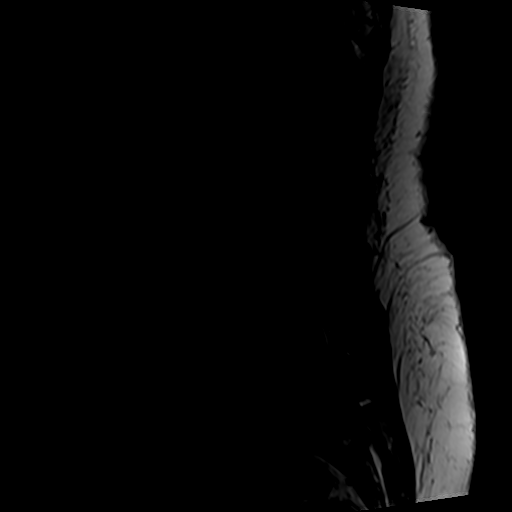
[im 12/15]
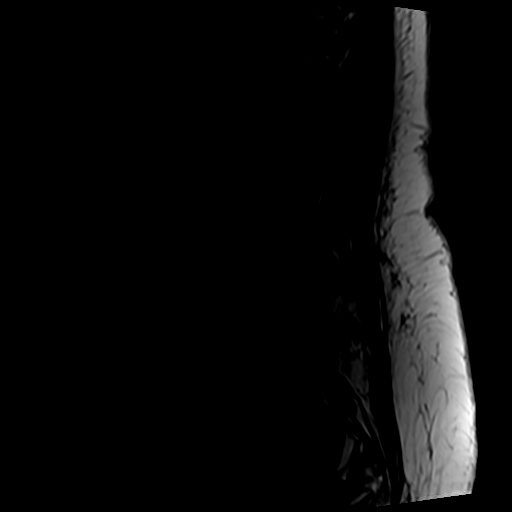
[im 15/15]
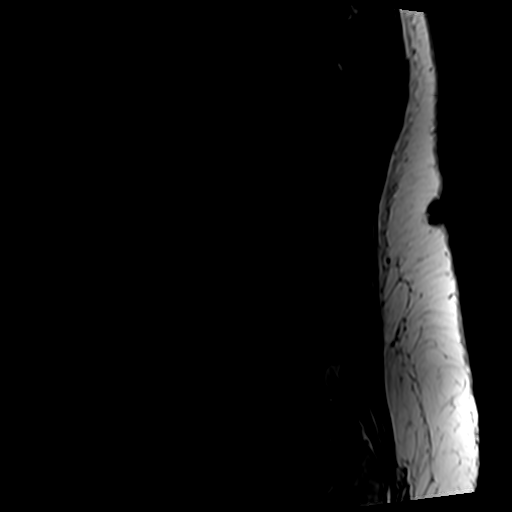

[Series 8: T2 · axial · 4.0mm · 0.70mm/px · z∈[-74,+122]mm · 9 of 37 slices shown (2 of 2)]
[im 1/37]
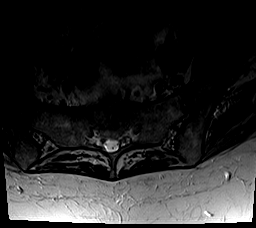
[im 6/37]
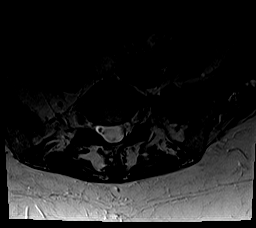
[im 11/37]
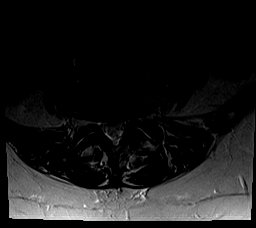
[im 16/37]
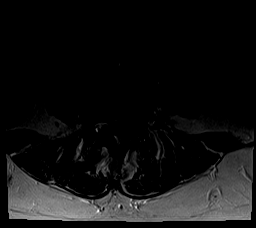
[im 19/37]
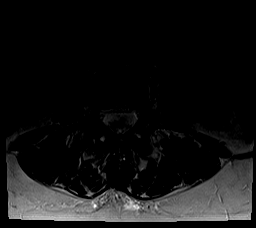
[im 21/37]
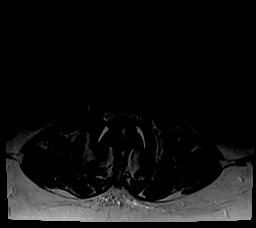
[im 26/37]
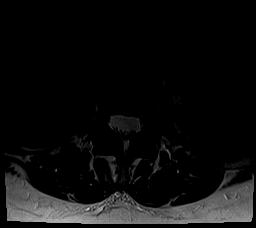
[im 31/37]
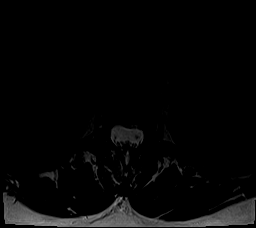
[im 37/37]
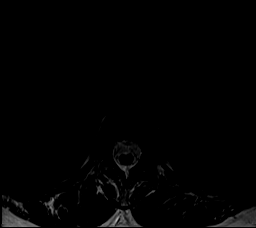

[Series 9: T1 · axial · 4.0mm · 0.35mm/px · z∈[-74,+91]mm · 5 of 37 slices shown (2 of 2)]
[im 1/37]
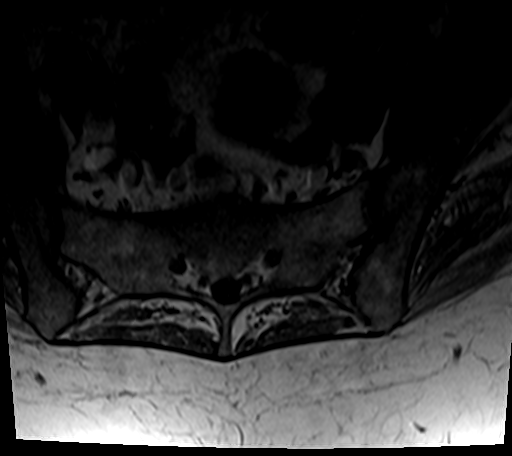
[im 6/37]
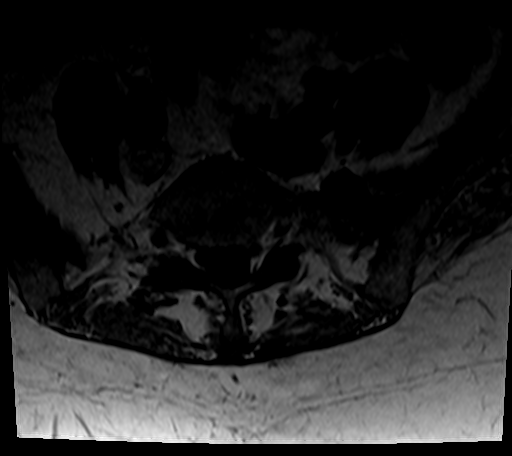
[im 11/37]
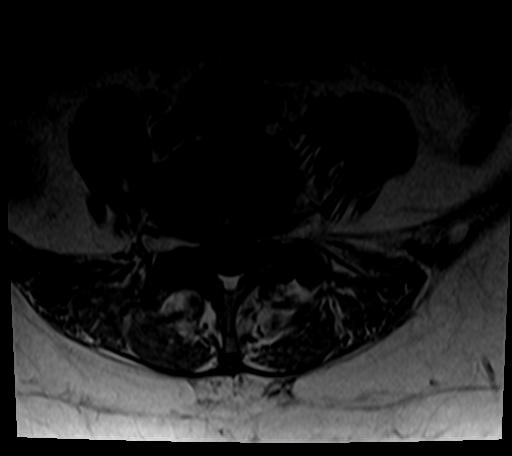
[im 19/37]
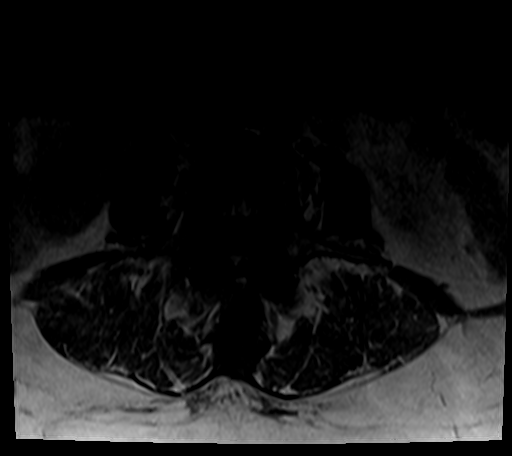
[im 31/37]
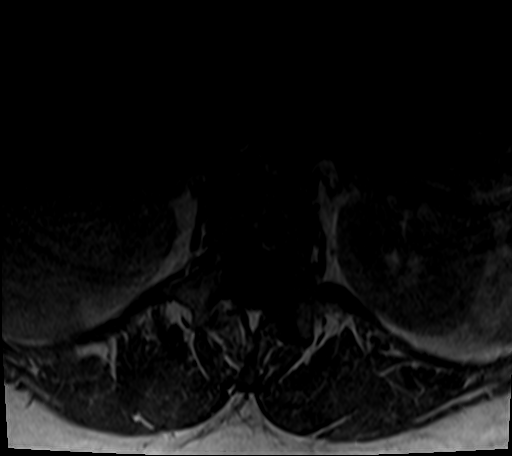

[26 of 48 positions shown; findings below may reference images not displayed]

FINDINGS: Segmentation: 5 lumbar type vertebrae based on the available
coverage. Incomplete segmentation at L5-S1

Alignment: Degenerative grade 1 anterolisthesis at L3-4. mild
scoliosis.

Vertebrae:  No fracture, evidence of discitis, or bone lesion.

Conus medullaris and cauda equina: Conus extends to the L1 level.
Conus and cauda equina appear normal.

Paraspinal and other soft tissues: No perispinal mass or
inflammation. Small fibroids.

Disc levels:

T12- L1: Unremarkable.

L1-L2: Unremarkable.

L2-L3: Disc narrowing and bulging. Degenerative facet spurring with
joint effusions and ligamentum flavum thickening. Mild spinal
stenosis. Moderate left foraminal narrowing based on sagittal images

L3-L4: Facet osteoarthritis with spurring and ligamentous
thickening. Anterolisthesis. Greatest level of disc narrowing with
bulging and endplate degeneration. Thecal sac compression and
biforaminal L3 impingement

L4-L5: Disc narrowing and bulging eccentric to the left far lateral
region. Facet osteoarthritis and ligamentum flavum thickening with
moderate left foraminal stenosis.

L5-S1:Incomplete segmentation with spurring at the left transverse
sacral pseudoarticulation. No degenerative changes or impingement.
IMPRESSION: 1. Generalized lumbar spine degeneration with scoliosis and L3-4
anterolisthesis.
2. Advanced spinal and biforaminal stenosis at L3-4.
3. Moderate foraminal narrowing on the left at L2-3 and L4-5.

## 2022-06-07 ENCOUNTER — Telehealth: Payer: Self-pay | Admitting: Student

## 2022-06-07 NOTE — Telephone Encounter (Signed)
Called patient to schedule Medicare Annual Wellness Visit (AWV). Left message for patient to call back and schedule Medicare Annual Wellness Visit (AWV).  Last date of AWV:  AWVI eligible as of 02/25/2009  Please schedule an AWVI appointment at any time with [facility] ANNUAL WELLNESS VISIT.  If any questions, please contact me at [phone].    Thank you,  Akhila Mahnken  Ambulatory Clinic Support [facility] Direct dial  [phone]   

## 2022-07-29 DIAGNOSIS — H401132 Primary open-angle glaucoma, bilateral, moderate stage: Secondary | ICD-10-CM | POA: Diagnosis not present

## 2022-09-12 ENCOUNTER — Other Ambulatory Visit: Payer: Self-pay | Admitting: Family Medicine

## 2022-09-12 DIAGNOSIS — I10 Essential (primary) hypertension: Secondary | ICD-10-CM

## 2022-09-16 ENCOUNTER — Other Ambulatory Visit: Payer: Self-pay | Admitting: Family Medicine

## 2022-09-16 DIAGNOSIS — I10 Essential (primary) hypertension: Secondary | ICD-10-CM

## 2022-09-30 ENCOUNTER — Ambulatory Visit: Payer: Medicare HMO

## 2022-10-24 ENCOUNTER — Encounter: Payer: Self-pay | Admitting: Student

## 2022-10-24 ENCOUNTER — Ambulatory Visit (INDEPENDENT_AMBULATORY_CARE_PROVIDER_SITE_OTHER): Payer: Medicare HMO | Admitting: Student

## 2022-10-24 VITALS — BP 142/72 | HR 62 | Ht 63.0 in | Wt 197.5 lb

## 2022-10-24 DIAGNOSIS — Z13228 Encounter for screening for other metabolic disorders: Secondary | ICD-10-CM | POA: Diagnosis not present

## 2022-10-24 DIAGNOSIS — G8929 Other chronic pain: Secondary | ICD-10-CM

## 2022-10-24 DIAGNOSIS — M545 Low back pain, unspecified: Secondary | ICD-10-CM

## 2022-10-24 DIAGNOSIS — R918 Other nonspecific abnormal finding of lung field: Secondary | ICD-10-CM

## 2022-10-24 DIAGNOSIS — J449 Chronic obstructive pulmonary disease, unspecified: Secondary | ICD-10-CM | POA: Diagnosis not present

## 2022-10-24 DIAGNOSIS — I10 Essential (primary) hypertension: Secondary | ICD-10-CM | POA: Diagnosis not present

## 2022-10-24 DIAGNOSIS — I340 Nonrheumatic mitral (valve) insufficiency: Secondary | ICD-10-CM | POA: Diagnosis not present

## 2022-10-24 LAB — POCT GLYCOSYLATED HEMOGLOBIN (HGB A1C): Hemoglobin A1C: 5.8 % — AB (ref 4.0–5.6)

## 2022-10-24 MED ORDER — LOSARTAN POTASSIUM-HCTZ 50-12.5 MG PO TABS: 1.0000 | ORAL_TABLET | Freq: Every day | ORAL | 0 refills | Status: DC

## 2022-10-24 NOTE — Assessment & Plan Note (Signed)
Last echocardiogram seen in chart in 2011 thought to be possibly severe.  She is asymptomatic today. -2-week follow-up -Consider ordering echocardiogram at that time

## 2022-10-24 NOTE — Patient Instructions (Addendum)
It was great to see you! Thank you for allowing me to participate in your care!   I recommend that you always bring your medications to each appointment as this makes it easy to ensure we are on the correct medications and helps [place] not miss when refills are needed.  Our plans for today:  - Increase your BP medication to 1 pill a day--I would like you to follow up with [place] in 2 weeks for a blood pressure recheck - I have placed an order for physical therapy for your back - We will get a scan of your lungs for the nodules seen on previous tests  We are checking some labs today, I will call you if they are abnormal will send you a MyChart message or a letter if they are normal.  If you do not hear about your labs in the next 2 weeks please let us know.  Take care and seek immediate care sooner if you develop any concerns. Please remember to show up 15 minutes before your scheduled appointment time!  Levin Erp, MD [facility]

## 2022-10-24 NOTE — Assessment & Plan Note (Signed)
Blood pressure not controlled today. -Increase losartan-HCTZ to 50-12.5 whole tablet daily -2-week follow-up for repeat BMP and blood pressure recheck

## 2022-10-24 NOTE — Assessment & Plan Note (Signed)
Currently doing well.  She is not adherent to Trelegy and does not require albuterol very often. -Monitor

## 2022-10-24 NOTE — Progress Notes (Signed)
    SUBJECTIVE:   Chief compliant/HPI: annual examination  [name] [name] is a 82 y.o. who presents today for an annual exam.   HTN On losartan-HCTZ 50-12.5, half a tablet daily.  Denies any issues with this.  Back pain Has been having lower back pain for a very long time.  She wears a back brace as well.  Received an MRI lumbar spine 10/2020 which showed generalized lumbar spine degeneration with scoliosis and anterolisthesis of L3-L4, advanced spinal and biforaminal stenosis of L3 and L4 and moderate foraminal narrowing at L2-L5.  She queries about epidural injections.  COPD Stopped smoking years ago -15-20 years.  Was previously put on Trelegy but she has not been taking this as she states that she feels very well.  Received a CT lung cancer screening in 2017 which showed multiple lung nodules and recommended follow-up in 1 year.  Patient is amenable to getting repeat CT scan today.  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 Denies any alcohol use History tabs reviewed and updated.    OBJECTIVE:   BP (!) 142/72   Pulse 62   Ht 5\' 3"  (1.6 m)   Wt 197 lb 8 oz (89.6 kg)   SpO2 100%   BMI 34.99 kg/m   General: Well appearing, NAD, awake, alert, responsive to questions Head: Normocephalic atraumatic, no neck masses or adenopathy, no JVD CV: Regular rate and rhythm, 3/6 holosystolic murmur present, 2+ radial pulses Respiratory: Clear to ausculation bilaterally, no wheezes rales or crackles, chest rises symmetrically,  no increased work of breathing Abdomen: Soft, non-tender, non-distended, normoactive bowel sounds  Extremities: Moves upper and lower extremities freely, no edema in LE Neuro: No focal deficits Skin: No rashes or lesions visualized   ASSESSMENT/PLAN:   Essential hypertension Blood pressure not controlled today. -Increase losartan-HCTZ to 50-12.5 whole tablet daily -2-week follow-up for repeat BMP and blood  pressure recheck  Moderate COPD (chronic obstructive pulmonary disease) (HCC) Currently doing well.  She is not adherent to Trelegy and does not require albuterol very often. -Monitor  Mitral regurgitation Last echocardiogram seen in chart in 2011 thought to be possibly severe.  She is asymptomatic today. -2-week follow-up -Consider ordering echocardiogram at that time  Low back pain Chronic lower back pain.  She is wearing a brace which I discussed likely limits mobility as well.  Amenable to PT referral today. -Physical therapy referral -Patient to let me know if wanting referral for foraminal injections   Annual Examination  See AVS for age appropriate recommendations   Considered the following items based upon USPSTF recommendations: Diabetes screening: ordered Screening for elevated cholesterol: ordered Lung cancer screening: CT lung without contrast ordered due to history of pulmonary nodules  Follow up in 1  year or sooner if indicated.    Levin Erp, MD [facility]

## 2022-10-24 NOTE — Assessment & Plan Note (Signed)
Chronic lower back pain.  She is wearing a brace which I discussed likely limits mobility as well.  Amenable to PT referral today. -Physical therapy referral -Patient to let me know if wanting referral for foraminal injections

## 2022-10-25 ENCOUNTER — Telehealth: Payer: Self-pay

## 2022-10-25 LAB — COMPREHENSIVE METABOLIC PANEL
ALT: 9 IU/L (ref 0–32)
AST: 13 IU/L (ref 0–40)
Albumin: 3.8 g/dL (ref 3.8–4.8)
Alkaline Phosphatase: 81 IU/L (ref 44–121)
BUN/Creatinine Ratio: 15 (ref 12–28)
BUN: 18 mg/dL (ref 8–27)
Bilirubin Total: 0.2 mg/dL (ref 0.0–1.2)
CO2: 26 mmol/L (ref 20–29)
Calcium: 9.1 mg/dL (ref 8.7–10.3)
Chloride: 104 mmol/L (ref 96–106)
Creatinine, Ser: 1.21 mg/dL — ABNORMAL HIGH (ref 0.57–1.00)
Globulin, Total: 3 g/dL (ref 1.5–4.5)
Glucose: 93 mg/dL (ref 70–99)
Potassium: 4.2 mmol/L (ref 3.5–5.2)
Sodium: 143 mmol/L (ref 134–144)
Total Protein: 6.8 g/dL (ref 6.0–8.5)
eGFR: 45 mL/min/{1.73_m2} — ABNORMAL LOW (ref >59)

## 2022-10-25 LAB — LIPID PANEL
Chol/HDL Ratio: 3.5 ratio (ref 0.0–4.4)
Cholesterol, Total: 180 mg/dL (ref 100–199)
HDL: 51 mg/dL (ref >39)
LDL Chol Calc (NIH): 114 mg/dL — ABNORMAL HIGH (ref 0–99)
Triglycerides: 79 mg/dL (ref 0–149)
VLDL Cholesterol Cal: 15 mg/dL (ref 5–40)

## 2022-10-25 LAB — CBC
Hematocrit: 34.3 % (ref 34.0–46.6)
Hemoglobin: 10.5 g/dL — ABNORMAL LOW (ref 11.1–15.9)
MCH: 24 pg — ABNORMAL LOW (ref 26.6–33.0)
MCHC: 30.6 g/dL — ABNORMAL LOW (ref 31.5–35.7)
MCV: 78 fL — ABNORMAL LOW (ref 79–97)
Platelets: 224 10*3/uL (ref 150–450)
RBC: 4.38 x10E6/uL (ref 3.77–5.28)
RDW: 15 % (ref 11.7–15.4)
WBC: 8.5 10*3/uL (ref 3.4–10.8)

## 2022-10-25 NOTE — Telephone Encounter (Signed)
Appt made for Sept. 13th at 9:10. Aquilla Solian, CMA

## 2022-10-25 NOTE — Telephone Encounter (Signed)
-----   Message from [facility] sent at 10/24/2022  1:20 PM EDT ----- Can we make sure there is a 2 week follow up for this patient for BP recheck?

## 2022-10-25 NOTE — Telephone Encounter (Signed)
Attempted to call patient. No answer. LVM for patient to call the office to make a follow up appt for BP recheck around the week of Sept. 12th. Aquilla Solian, CMA

## 2022-10-30 ENCOUNTER — Ambulatory Visit (HOSPITAL_COMMUNITY)
Admission: RE | Admit: 2022-10-30 | Discharge: 2022-10-30 | Disposition: A | Discharge status: Home / Self Care | Payer: Medicare HMO | Source: Ambulatory Visit | Attending: Family Medicine | Admitting: Family Medicine

## 2022-10-30 DIAGNOSIS — I700 Atherosclerosis of aorta: Secondary | ICD-10-CM | POA: Diagnosis not present

## 2022-10-30 DIAGNOSIS — R918 Other nonspecific abnormal finding of lung field: Secondary | ICD-10-CM | POA: Diagnosis not present

## 2022-10-30 DIAGNOSIS — J439 Emphysema, unspecified: Secondary | ICD-10-CM | POA: Diagnosis not present

## 2022-11-01 ENCOUNTER — Encounter: Payer: Self-pay | Admitting: Pharmacist

## 2022-11-05 NOTE — Progress Notes (Signed)
    SUBJECTIVE:   CHIEF COMPLAINT / HPI:   Hypertension: Patient is a 82 y.o. female who present today for follow up of hypertension.   Patient endorses no problems  Home medications include: Increased losartan-hydrochlorothiazide to 50-12.5 at last viist Patient endorses that she has not picked up her new prescription and has been taking her old prescription.  Most recent creatinine trend:  Lab Results  Component Value Date   CREATININE 1.21 (H) 10/24/2022   CREATININE 1.29 (H) 10/05/2021   CREATININE 1.24 (H) 08/22/2020   Left Knee pain Chronic left knee pain. Had knee replacement in both 14 years ago. Hurts forever and does not seem like it is changed. No systemic symptoms   PERTINENT  PMH / PSH: COPD,    OBJECTIVE:   BP (!) 142/83   Pulse 64   Ht 5\' 3"  (1.6 m)   Wt 200 lb 2 oz (90.8 kg)   SpO2 98%   BMI 35.45 kg/m   General: NAD, awake, alert, responsive to questions Head: Normocephalic atraumatic Respiratory: chest rises symmetrically,  no increased work of breathing Extremities: Moves upper and lower extremities freely, no edema in LE, knee stable on exam, well perfused   ASSESSMENT/PLAN:   Essential hypertension Patient to follow-up in 2 to 3 weeks after starting new medication.  At that time we will get labs and recheck blood pressure. -Refilled losartan-HCTZ 50-12.5 -Repeat BMP at next visit  Total knee replacement status Knee stable on exam, discussed stretches and strengthening for knee. Declines PT.    Levin Erp, MD [facility]

## 2022-11-07 ENCOUNTER — Other Ambulatory Visit: Payer: Self-pay | Admitting: Student

## 2022-11-07 DIAGNOSIS — I10 Essential (primary) hypertension: Secondary | ICD-10-CM

## 2022-11-08 ENCOUNTER — Encounter: Payer: Self-pay | Admitting: Student

## 2022-11-08 ENCOUNTER — Ambulatory Visit (INDEPENDENT_AMBULATORY_CARE_PROVIDER_SITE_OTHER): Payer: Medicare HMO | Admitting: Student

## 2022-11-08 VITALS — BP 142/83 | HR 64 | Ht 63.0 in | Wt 200.1 lb

## 2022-11-08 DIAGNOSIS — Z96653 Presence of artificial knee joint, bilateral: Secondary | ICD-10-CM

## 2022-11-08 DIAGNOSIS — I10 Essential (primary) hypertension: Secondary | ICD-10-CM

## 2022-11-08 DIAGNOSIS — Z23 Encounter for immunization: Secondary | ICD-10-CM

## 2022-11-08 NOTE — Patient Instructions (Signed)
It was great to see you! Thank you for allowing me to participate in your care!   I recommend that you always bring your medications to each appointment as this makes it easy to ensure we are on the correct medications and helps [place] not miss when refills are needed.  Our plans for today:  - Please follow up in 2-3 weeks for blood pressure recheck and labs after you take the new medicine - Work on the exercises for your knees we talked about  We are checking some labs today, I will call you if they are abnormal will send you a MyChart message or a letter if they are normal.  If you do not hear about your labs in the next 2 weeks please let us know.  Take care and seek immediate care sooner if you develop any concerns. Please remember to show up 15 minutes before your scheduled appointment time!  Levin Erp, MD [facility]

## 2022-11-08 NOTE — Assessment & Plan Note (Signed)
Patient to follow-up in 2 to 3 weeks after starting new medication.  At that time we will get labs and recheck blood pressure. -Refilled losartan-HCTZ 50-12.5 -Repeat BMP at next visit

## 2022-11-08 NOTE — Assessment & Plan Note (Signed)
Knee stable on exam, discussed stretches and strengthening for knee. Declines PT.

## 2022-11-13 ENCOUNTER — Telehealth: Payer: Self-pay | Admitting: Student

## 2022-11-13 NOTE — Telephone Encounter (Signed)
Routed message to PCP. Aquilla Solian, CMA

## 2022-11-13 NOTE — Telephone Encounter (Signed)
Patient walked in stating the incorrect dose of lasartan was prescribed, it should have been higher. Went to the pharmacy and picked it up; it was the same as before.    Ph # [phone]

## 2022-11-14 ENCOUNTER — Other Ambulatory Visit: Payer: Self-pay | Admitting: Student

## 2022-11-14 DIAGNOSIS — Z1231 Encounter for screening mammogram for malignant neoplasm of breast: Secondary | ICD-10-CM

## 2022-11-14 NOTE — Telephone Encounter (Signed)
Attempted to reach patient. No answer. LVM of note left by Dr. Laroy Apple. Aquilla Solian, CMA

## 2022-12-02 DIAGNOSIS — H401132 Primary open-angle glaucoma, bilateral, moderate stage: Secondary | ICD-10-CM | POA: Diagnosis not present

## 2022-12-10 ENCOUNTER — Ambulatory Visit: Payer: Medicare HMO | Admitting: Student

## 2022-12-10 NOTE — Progress Notes (Deleted)
    SUBJECTIVE:   CHIEF COMPLAINT / HPI:   Hypertension: Patient is a 82 y.o. female who present today for follow up of hypertension.   Patient endorses {rwdmsmartlistproblems:24882}  Home medications include: Losartan 50-hydrochlorothiazide 12.5 Patient endorses taking these medications as prescribed.*** Denies any headache, vision changes, shortness of breath, lower extremity swelling or chest pain   Most recent creatinine trend:  Lab Results  Component Value Date   CREATININE 1.21 (H) 10/24/2022   CREATININE 1.29 (H) 10/05/2021   CREATININE 1.24 (H) 08/22/2020   Patient {rwdoesdoesnot:24881} check blood pressure at home.  Patient {HAS HAS KGM:01027} had a BMP in the past 1 year.  PERTINENT  PMH / PSH: ***  OBJECTIVE:   There were no vitals taken for this visit.  ***  ASSESSMENT/PLAN:   No problem-specific Assessment & Plan notes found for this encounter.     Levin Erp, MD [facility]

## 2022-12-11 ENCOUNTER — Ambulatory Visit
Admission: RE | Admit: 2022-12-11 | Discharge: 2022-12-11 | Disposition: A | Discharge status: Home / Self Care | Payer: Medicare HMO | Source: Ambulatory Visit | Attending: Family Medicine | Admitting: Family Medicine

## 2022-12-11 DIAGNOSIS — Z1231 Encounter for screening mammogram for malignant neoplasm of breast: Secondary | ICD-10-CM

## 2022-12-13 ENCOUNTER — Ambulatory Visit (INDEPENDENT_AMBULATORY_CARE_PROVIDER_SITE_OTHER): Payer: Self-pay

## 2022-12-13 VITALS — Ht 63.0 in | Wt 200.0 lb

## 2022-12-13 DIAGNOSIS — Z0000 Encounter for general adult medical examination without abnormal findings: Secondary | ICD-10-CM

## 2022-12-13 NOTE — Progress Notes (Signed)
Subjective:   [name] [name] is a 82 y.o. female who presents for an Initial Medicare Annual Wellness Visit.  Visit Complete: Virtual I connected with  [name] [name] on 12/13/22 by a audio enabled telemedicine application and verified that I am speaking with the correct person using two identifiers.  Patient Location: Home  Provider Location: Home Office  I discussed the limitations of evaluation and management by telemedicine. The patient expressed understanding and agreed to proceed.  Vital Signs: Because this visit was a virtual/telehealth visit, some criteria may be missing or patient reported. Any vitals not documented were not able to be obtained and vitals that have been documented are patient reported.  Cardiac Risk Factors include: advanced age (>89men, >83 women);hypertension;sedentary lifestyle     Objective:    Today's Vitals   12/13/22 2029  Weight: 200 lb (90.7 kg)  Height: 5\' 3"  (1.6 m)   Body mass index is 35.43 kg/m.     12/13/2022    8:49 PM 11/08/2022    9:09 AM 10/24/2022   11:20 AM 01/04/2022   10:25 AM 11/13/2021   10:01 AM 10/05/2021    9:06 AM 09/20/2020    2:41 PM  Advanced Directives  Does Patient Have a Medical Advance Directive? No No No Yes Yes No No  Type of Advance Directive    Living will Living will    Does patient want to make changes to medical advance directive?    No - Patient declined No - Patient declined    Would patient like information on creating a medical advance directive? Yes (MAU/Ambulatory/Procedural Areas - Information given) No - Patient declined No - Patient declined No - Patient declined No - Patient declined No - Patient declined No - Patient declined    Current Medications (verified) Outpatient Encounter Medications as of 12/13/2022  Medication Sig   albuterol (VENTOLIN HFA) 108 (90 Base) MCG/ACT inhaler TAKE 2 PUFFS BY MOUTH EVERY 6 HOURS AS NEEDED   aspirin 81 MG tablet Take 81 mg by mouth daily.   ferrous sulfate  325 (65 FE) MG tablet Take 1 tablet (325 mg total) by mouth daily.   Fluticasone-Umeclidin-Vilant (TRELEGY ELLIPTA) 100-62.5-25 MCG/ACT AEPB Inhale 1 puff into the lungs daily.   losartan-hydrochlorothiazide (HYZAAR) 50-12.5 MG tablet TAKE 1 TABLET BY MOUTH DAILY. NEEDS PCP FOLLOW UP.   polyethylene glycol (MIRALAX / GLYCOLAX) 17 g packet Take 17 g by mouth daily.   timolol (TIMOPTIC) 0.5 % ophthalmic solution APPLY 1 DROP(S) IN BOTH EYES ONCE A DAY   No facility-administered encounter medications on file as of 12/13/2022.    Allergies (verified) Ace inhibitors   History: Past Medical History:  Diagnosis Date   Anemia    Fe def   Arthritis    GERD (gastroesophageal reflux disease)    Glaucoma    Hypertension    Postmenopausal status    Past Surgical History:  Procedure Laterality Date   CESAREAN SECTION     x2   colonscopy      times 2   Internal hemorrhoids     JOINT REPLACEMENT  2011   left knee replacement   KNEE ARTHROPLASTY  04/19/10   Dr Luiz Blare   Sigmoid Diverticulosis     TOTAL KNEE ARTHROPLASTY Right 01/04/2014   Procedure: RIGHT TOTAL KNEE ARTHROPLASTY;  Surgeon: Jacki Cones, MD;  Location: [facility];  Service: Orthopedics;  Laterality: Right;   TUBAL LIGATION  1978   Family History  Problem Relation Age of Onset  Hypertension Father    Stroke Father    Prostate cancer Brother    Breast cancer Neg Hx    Social History   Socioeconomic History   Marital status: Married    Spouse name: Not on file   Number of children: 4   Years of education: Not on file   Highest education level: Not on file  Occupational History   Not on file  Tobacco Use   Smoking status: Former    Current packs/day: 0.00    Average packs/day: 1 pack/day for 33.0 years (33.0 ttl pk-yrs)    Types: Cigarettes    Start date: 02/26/1976    Quit date: 02/25/2009    Years since quitting: 13.8   Smokeless tobacco: Never  Vaping Use   Vaping status: Never Used  Substance and Sexual  Activity   Alcohol use: No   Drug use: No   Sexual activity: Yes  Other Topics Concern   Not on file  Social History Narrative   Married, lives with husband and grandson (pt adopted him when he was 40 days old, age 19 now).     Works at [employer]     5 children (youngest adopted- he doesn't know)   smokes 1-2 pack per day until 2009   no EtOH   Drug use-no         Social Determinants of Health   Financial Resource Strain: Low Risk  (12/13/2022)   Overall Financial Resource Strain (CARDIA)    Difficulty of Paying Living Expenses: Not hard at all  Food Insecurity: No Food Insecurity (12/13/2022)   Hunger Vital Sign    Worried About Running Out of Food in the Last Year: Never true    Ran Out of Food in the Last Year: Never true  Transportation Needs: No Transportation Needs (12/13/2022)   PRAPARE - Administrator, Civil Service (Medical): No    Lack of Transportation (Non-Medical): No  Physical Activity: Inactive (12/13/2022)   Exercise Vital Sign    Days of Exercise per Week: 0 days    Minutes of Exercise per Session: 0 min  Stress: No Stress Concern Present (12/13/2022)   [employer] of Occupational Health - Occupational Stress Questionnaire    Feeling of Stress : Not at all  Social Connections: Moderately Integrated (12/13/2022)   Social Connection and Isolation Panel [NHANES]    Frequency of Communication with Friends and Family: More than three times a week    Frequency of Social Gatherings with Friends and Family: Three times a week    Attends Religious Services: More than 4 times per year    Active Member of Clubs or Organizations: No    Attends Banker Meetings: Never    Marital Status: Married    Tobacco Counseling Counseling given: Not Answered   Clinical Intake:  Pre-visit preparation completed: Yes  Pain : No/denies pain     Diabetes: No  How often do you need to have someone help you when you read  instructions, pamphlets, or other written materials from your doctor or pharmacy?: 1 - Never  Interpreter Needed?: No  Information entered by :: Kandis Fantasia LPN   Activities of Daily Living    12/13/2022    8:48 PM  In your present state of health, do you have any difficulty performing the following activities:  Hearing? 0  Vision? 0  Difficulty concentrating or making decisions? 0  Walking or climbing stairs? 0  Dressing or  bathing? 0  Doing errands, shopping? 0  Preparing Food and eating ? N  Using the Toilet? N  In the past six months, have you accidently leaked urine? Y  Do you have problems with loss of bowel control? N  Managing your Medications? N  Managing your Finances? N  Housekeeping or managing your Housekeeping? N    Patient Care Team: Levin Erp, MD as PCP - General (Family Medicine)  Indicate any recent Medical Services you may have received from other than [facility] providers in the past year (date may be approximate).     Assessment:   This is a routine wellness examination for [place].  Hearing/Vision screen Hearing Screening - Comments:: Some hearing loss  Vision Screening - Comments:: Wears rx glasses - up to date with routine eye exams with Dr. Reginia Naas     Goals Addressed             This Visit's Progress    Remain active and independent        Depression Screen    11/08/2022    9:10 AM 10/24/2022   11:21 AM 01/04/2022   10:26 AM 11/13/2021   10:03 AM 10/05/2021    9:59 AM 09/20/2020    2:40 PM 08/22/2020    2:18 PM  PHQ 2/9 Scores  PHQ - 2 Score 2 0 0 0 3 0 3  PHQ- 9 Score 5 9 0 4 7 3 8     Fall Risk    12/13/2022    8:48 PM 11/08/2022    9:11 AM 10/24/2022   11:21 AM 01/04/2022   10:26 AM 11/13/2021   10:02 AM  Fall Risk   Falls in the past year? 0 0 0 0 1  Number falls in past yr: 0 0 0 0 0  Injury with Fall? 0 0 0 0 0  Risk for fall due to : No Fall Risks      Follow up Falls prevention discussed;Education  provided;Falls evaluation completed        MEDICARE RISK AT HOME: Medicare Risk at Home Any stairs in or around the home?: No If so, are there any without handrails?: No Home free of loose throw rugs in walkways, pet beds, electrical cords, etc?: Yes Adequate lighting in your home to reduce risk of falls?: Yes Life alert?: No Use of a cane, walker or w/c?: No Grab bars in the bathroom?: Yes Shower chair or bench in shower?: No Elevated toilet seat or a handicapped toilet?: Yes  TIMED UP AND GO:  Was the test performed? No    Cognitive Function:        12/13/2022    8:49 PM  6CIT Screen  What Year? 0 points  What month? 0 points  What time? 0 points  Count back from 20 0 points  Months in reverse 2 points  Repeat phrase 0 points  Total Score 2 points    Immunizations Immunization History  Administered Date(s) Administered   Fluad Quad(high Dose 65+) 11/18/2019, 11/13/2021   Fluad Trivalent(High Dose 65+) 11/08/2022   Influenza Whole 12/14/2008   Influenza,inj,Quad PF,6+ Mos 11/16/2013, 05/03/2015, 01/03/2016, 02/21/2017, 02/28/2018   Influenza-Unspecified 09/25/2012   PFIZER Comirnaty(Gray Top)Covid-19 Tri-Sucrose Vaccine 09/20/2020   PFIZER(Purple Top)SARS-COV-2 Vaccination 04/03/2019, 04/26/2019   PNEUMOCOCCAL CONJUGATE-20 10/05/2021   PPD Test 07/04/2014, 07/13/2014   Pneumococcal Conjugate-13 05/03/2015   Pneumococcal Polysaccharide-23 07/20/2010   Td 06/03/2006    TDAP status: Due, Education has been provided regarding the importance of this vaccine.  Advised may receive this vaccine at local pharmacy or Health Dept. Aware to provide a copy of the vaccination record if obtained from local pharmacy or Health Dept. Verbalized acceptance and understanding.  Flu Vaccine status: Up to date  Pneumococcal vaccine status: Up to date  Covid-19 vaccine status: Information provided on how to obtain vaccines.   Qualifies for Shingles Vaccine? Yes   Zostavax  completed No   Shingrix Completed?: No.    Education has been provided regarding the importance of this vaccine. Patient has been advised to call insurance company to determine out of pocket expense if they have not yet received this vaccine. Advised may also receive vaccine at local pharmacy or Health Dept. Verbalized acceptance and understanding.  Screening Tests Health Maintenance  Topic Date Due   Zoster Vaccines- Shingrix (1 of 2) Never done   DTaP/Tdap/Td (2 - Tdap) 06/02/2016   COVID-19 Vaccine (4 - 2023-24 season) 10/27/2022   Lung Cancer Screening  10/30/2023   Medicare Annual Wellness (AWV)  12/13/2023   Pneumonia Vaccine 19+ Years old  Completed   INFLUENZA VACCINE  Completed   DEXA SCAN  Completed   HPV VACCINES  Aged Out   Hepatitis C Screening  Discontinued    Health Maintenance  Health Maintenance Due  Topic Date Due   Zoster Vaccines- Shingrix (1 of 2) Never done   DTaP/Tdap/Td (2 - Tdap) 06/02/2016   COVID-19 Vaccine (4 - 2023-24 season) 10/27/2022    Colorectal cancer screening: No longer required.   Mammogram status: Completed 12/11/22. Repeat every year  Bone Density status: Completed 08/24/12. Results reflect: Bone density results: NORMAL. Repeat every 5 years.  Lung Cancer Screening: (Low Dose CT Chest recommended if Age 44-80 years, 20 pack-year currently smoking OR have quit w/in 15years.) does not qualify.   Lung Cancer Screening Referral: n/a  Additional Screening:  Hepatitis C Screening: does qualify; Completed 11/18/19  Vision Screening: Recommended annual ophthalmology exams for early detection of glaucoma and other disorders of the eye. Is the patient up to date with their annual eye exam?  Yes  Who is the provider or what is the name of the office in which the patient attends annual eye exams? Dr. Reginia Naas  If pt is not established with a provider, would they like to be referred to a provider to establish care? No .   Dental Screening:  Recommended annual dental exams for proper oral hygiene  Community Resource Referral / Chronic Care Management: CRR required this visit?  No   CCM required this visit?  No     Plan:     I have personally reviewed and noted the following in the patient's chart:   Medical and social history Use of alcohol, tobacco or illicit drugs  Current medications and supplements including opioid prescriptions. Patient is not currently taking opioid prescriptions. Functional ability and status Nutritional status Physical activity Advanced directives List of other physicians Hospitalizations, surgeries, and ER visits in previous 12 months Vitals Screenings to include cognitive, depression, and falls Referrals and appointments  In addition, I have reviewed and discussed with patient certain preventive protocols, quality metrics, and best practice recommendations. A written personalized care plan for preventive services as well as general preventive health recommendations were provided to patient.     Kandis Fantasia [place], [place]   [id number]/5621   After Visit Summary: (Mail) Due to this being a telephonic visit, the after visit summary with patients personalized plan was offered to patient via mail  Nurse Notes: No concerns at this time

## 2022-12-13 NOTE — Patient Instructions (Signed)
[name] [name] , Thank you for taking time to come for your Medicare Wellness Visit. I appreciate your ongoing commitment to your health goals. Please review the following plan we discussed and let me know if I can assist you in the future.   Referrals/Orders/Follow-Ups/Clinician Recommendations: Aim for 30 minutes of exercise or brisk walking, 6-8 glasses of water, and 5 servings of fruits and vegetables each day.  This is a list of the screening recommended for you and due dates:  Health Maintenance  Topic Date Due   Zoster (Shingles) Vaccine (1 of 2) Never done   DTaP/Tdap/Td vaccine (2 - Tdap) 06/02/2016   COVID-19 Vaccine (4 - 2023-24 season) 10/27/2022   Screening for Lung Cancer  10/30/2023   Medicare Annual Wellness Visit  12/13/2023   Pneumonia Vaccine  Completed   Flu Shot  Completed   DEXA scan (bone density measurement)  Completed   HPV Vaccine  Aged Out   Hepatitis C Screening  Discontinued    Advanced directives: (ACP Link)Information on Advanced Care Planning can be found at [facility] [place] Advance Health Care Directives Advance Health Care Directives ([email or web address]   Next Medicare Annual Wellness Visit scheduled for next year: Yes

## 2023-01-13 ENCOUNTER — Ambulatory Visit (INDEPENDENT_AMBULATORY_CARE_PROVIDER_SITE_OTHER): Payer: Medicare HMO | Admitting: Student

## 2023-01-13 DIAGNOSIS — J449 Chronic obstructive pulmonary disease, unspecified: Secondary | ICD-10-CM

## 2023-01-13 MED ORDER — TRELEGY ELLIPTA 100-62.5-25 MCG/ACT IN AEPB: 1.0000 | INHALATION_SPRAY | Freq: Every day | RESPIRATORY_TRACT | 1 refills | Status: DC

## 2023-01-13 NOTE — Progress Notes (Cosign Needed Addendum)
    SUBJECTIVE:   CHIEF COMPLAINT / HPI:   [name] [name] is a 82 y.o. female who presents today for cough.  Cough / COPD Past 3 weeks has had cold symptoms of cough productive of green phlegm, congestion, rhinorrhea, hoarseness, mild dyspnea. Had chills, unsure of fever. Symptoms have almost completely improved, feels back to her baseline. Still having a bit of a dry cough and wants to know what medications she can take for this. Tried Mucinex without much improvement. Also has an albuterol inhaler; has not been taking Trelegy Elipta. 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  Pt's husband also reports she has had recent difficulty with short-term memory, though her long-term memory is intact.  Pt is also wondering about continued knee pain, and is planning to reach back out to her orthopedic surgeon for an appointment to address this.   PERTINENT  PMH / PSH: HTN, COPD, iron deficiency anemia  OBJECTIVE:   BP (!) 143/72   Pulse 78   Ht 5\' 2"  (1.575 m)   Wt 197 lb 9.6 oz (89.6 kg)   SpO2 98%   BMI 36.14 kg/m   General: Pt is seated in chair, no acute distress. Cardiovascular: RRR, no rubs, gallops. Systolic murmur Pulmonary: Normal work of breathing. Lungs clear to auscultation bilaterally. HEENT: Conjunctiva non erythematous bilaterally. Nose without congestions, rhinorrhea. Oral mucosa pink and moist; some erythema in back of throat. No cervical lymphadenopathy. Neuro/Psych: Alert and oriented to person, place, event, time. Normal affect.  ASSESSMENT/PLAN:   Assessment & Plan Moderate COPD (chronic obstructive pulmonary disease) (HCC) Pt with recent cold symptoms including productive cough, now improved. Suspect viral illness as cause of symptoms in setting underlying COPD. Given near complete resolution of symptoms, do not suspect acute COPD exacerbation; no indication for steroids or antibiotics at this time. SpO2 98% ORA and able to speak in full  sentences. She has not been taking Trelegy Elipta; emphasized importance of regularly taking this medication. - START Trelegy Elipta one puff per day - Return precautions and supportive measures discussed  Memory Concern Pt's husband reports pt has experienced impaired short term memory. Given nature of access to care clinic, cannot address this at current clinic. Recommended pt make appointment with Geriatric Clinic for further evaluation + recommendations.   Governor Rooks, Medical Student [facility]  I have personally seen and examined this patient with Marchelle Folks and agree with the above note.   Darral Dash, DO 01/13/2023, 2:47 PM PGY-3, [facility]

## 2023-01-13 NOTE — Assessment & Plan Note (Addendum)
Pt with recent cold symptoms including productive cough, now improved. Suspect viral illness as cause of symptoms in setting underlying COPD. Given near complete resolution of symptoms, do not suspect acute COPD exacerbation; no indication for steroids or antibiotics at this time. SpO2 98% ORA and able to speak in full sentences. She has not been taking Trelegy Elipta; emphasized importance of regularly taking this medication. - START Trelegy Elipta one puff per day - Return precautions and supportive measures discussed

## 2023-01-13 NOTE — Patient Instructions (Addendum)
[name] [name],  It was lovely seeing you in clinic today! You came in today because of a lingering cough. We think this is due to a combination of a recent viral illness paired with your underlying COPD (obstructive lung disease).  There are a few things for you to do after today's visit: START taking your Trelegy Ellipta at one puff a day Use honey to help with cough symptoms; continue to be wary of over the counter combination medications as some of these can worsen glaucoma Please call the office if your cough is not improving or is worsening Make an appointment in Geriatric Clinic when you check out to evaluate your memory concerns Call your orthopedic doctor about your knee pain and let us know if you need a referral to go back to them  Thank you for allowing [place] to be a part of your care team! Governor Rooks, medical student Dr. Darral Dash

## 2023-02-05 ENCOUNTER — Encounter: Payer: Self-pay | Admitting: Student

## 2023-02-05 ENCOUNTER — Ambulatory Visit (INDEPENDENT_AMBULATORY_CARE_PROVIDER_SITE_OTHER): Payer: Medicare HMO | Admitting: Student

## 2023-02-05 VITALS — BP 139/75 | HR 64 | Temp 98.1°F | Ht 62.0 in | Wt 198.5 lb

## 2023-02-05 DIAGNOSIS — J441 Chronic obstructive pulmonary disease with (acute) exacerbation: Secondary | ICD-10-CM | POA: Diagnosis not present

## 2023-02-05 MED ORDER — AZITHROMYCIN 500 MG PO TABS: 500.0000 mg | ORAL_TABLET | Freq: Every day | ORAL | 0 refills | Status: AC

## 2023-02-05 MED ORDER — PREDNISONE 20 MG PO TABS: 40.0000 mg | ORAL_TABLET | Freq: Every day | ORAL | 0 refills | Status: AC

## 2023-02-05 NOTE — Patient Instructions (Signed)
It was great to see you today! Thank you for choosing [facility] for your primary care.  Today we addressed: Take one tab of azithromycin for 3 days  Take 40 mg of prednisone for 5 days  Return or call at the beginning of the week next week if unchanged   If you haven't already, sign up for My Chart to have easy access to your labs results, and communication with your primary care physician.  Please arrive 15 minutes before your appointment to ensure smooth check in process.  We appreciate your efforts in making this happen.  Thank you for allowing me to participate in your care, Alfredo Martinez, MD 02/05/2023, 8:06 AM PGY-3, [facility]

## 2023-02-05 NOTE — Progress Notes (Signed)
    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  Exposed to any respiratory irritants, such as smoke, dust, or strong odors: No Recent respiratory infections or contact with individuals who are sick: Administrator, sports  Taking COPD medications as prescribed: Yes  Any recent changes to medication regimen: No   PERTINENT  PMH / PSH:  HTN, COPD, iron deficiency anemia   OBJECTIVE:   BP 139/75   Pulse 64   Temp 98.1 F (36.7 C) (Oral)   Ht 5\' 2"  (1.575 m)   Wt 198 lb 8 oz (90 kg)   SpO2 98%   BMI 36.31 kg/m   General: Alert and oriented in no apparent distress Heart: Regular rate and rhythm with no murmurs appreciated Lungs: Scant expiratory wheeze at bases, no focal diminishment but decreased lung sounds throughout.  Normal work of breathing without use of accessory muscles Abdomen: Bowel sounds present, no abdominal pain Skin: Warm and dry Extremities: No lower extremity edema   ASSESSMENT/PLAN:   Assessment & Plan COPD with acute exacerbation (HCC) Likely acute exacerbation of COPD, with productive cough for a few days. Wheezing on auscultation despite using inhalers, however, maintains good air movement and normal WOB on room air with O2 sat 98%.  Will treat with steroid burst and abx as indicated.  Given patient's age and history, will be very cautious although pneumonia is less likely given presentation.  If patient continues to have symptoms despite treatment for COPD exacerbation, she was instructed to return at the beginning of next week for further assessment. - Prednisone 40mg  qd x5d - Azithro 500 x3d - Home  inhalers  - strict return precautions      Alfredo Martinez, MD [facility]

## 2023-02-06 DIAGNOSIS — J441 Chronic obstructive pulmonary disease with (acute) exacerbation: Secondary | ICD-10-CM | POA: Insufficient documentation

## 2023-02-06 NOTE — Assessment & Plan Note (Signed)
Likely acute exacerbation of COPD, with productive cough for a few days. Wheezing on auscultation despite using inhalers, however, maintains good air movement and normal WOB on room air with O2 sat 98%.  Will treat with steroid burst and abx as indicated.  Given patient's age and history, will be very cautious although pneumonia is less likely given presentation.  If patient continues to have symptoms despite treatment for COPD exacerbation, she was instructed to return at the beginning of next week for further assessment. - Prednisone 40mg  qd x5d - Azithro 500 x3d - Home inhalers  - strict return precautions

## 2023-02-13 ENCOUNTER — Ambulatory Visit: Payer: Medicare HMO

## 2023-02-13 DIAGNOSIS — N1831 Chronic kidney disease, stage 3a: Secondary | ICD-10-CM | POA: Insufficient documentation

## 2023-02-13 DIAGNOSIS — H401132 Primary open-angle glaucoma, bilateral, moderate stage: Secondary | ICD-10-CM | POA: Insufficient documentation

## 2023-04-08 ENCOUNTER — Other Ambulatory Visit: Payer: Self-pay | Admitting: Student

## 2023-04-08 DIAGNOSIS — I10 Essential (primary) hypertension: Secondary | ICD-10-CM

## 2023-05-06 ENCOUNTER — Other Ambulatory Visit: Payer: Self-pay | Admitting: Student

## 2023-05-06 DIAGNOSIS — I10 Essential (primary) hypertension: Secondary | ICD-10-CM

## 2023-05-12 ENCOUNTER — Other Ambulatory Visit: Payer: Self-pay

## 2023-05-12 ENCOUNTER — Ambulatory Visit (INDEPENDENT_AMBULATORY_CARE_PROVIDER_SITE_OTHER): Admitting: Family Medicine

## 2023-05-12 VITALS — BP 138/75 | HR 89 | Temp 97.4°F | Ht 62.0 in | Wt 193.4 lb

## 2023-05-12 DIAGNOSIS — M545 Low back pain, unspecified: Secondary | ICD-10-CM

## 2023-05-12 DIAGNOSIS — J101 Influenza due to other identified influenza virus with other respiratory manifestations: Secondary | ICD-10-CM | POA: Diagnosis not present

## 2023-05-12 DIAGNOSIS — I340 Nonrheumatic mitral (valve) insufficiency: Secondary | ICD-10-CM

## 2023-05-12 DIAGNOSIS — I480 Paroxysmal atrial fibrillation: Secondary | ICD-10-CM | POA: Insufficient documentation

## 2023-05-12 MED ORDER — AZELASTINE HCL 0.1 % NA SOLN: 1.0000 | Freq: Two times a day (BID) | NASAL | 2 refills | Status: AC

## 2023-05-12 MED ORDER — APIXABAN 2.5 MG PO TABS: 2.5000 mg | ORAL_TABLET | Freq: Two times a day (BID) | ORAL | 3 refills | Status: DC

## 2023-05-12 MED ORDER — CYCLOBENZAPRINE HCL 5 MG PO TABS: 2.5000 mg | ORAL_TABLET | Freq: Every evening | ORAL | 1 refills | Status: AC | PRN

## 2023-05-12 MED ORDER — FLUTICASONE PROPIONATE 50 MCG/ACT NA SUSP: 2.0000 | Freq: Every day | NASAL | 6 refills | Status: AC

## 2023-05-12 MED ORDER — DILTIAZEM HCL ER 120 MG PO CP12
120.0000 mg | ORAL_CAPSULE | Freq: Every day | ORAL | 0 refills | Status: DC
Start: 2023-05-12 — End: 2023-07-04

## 2023-05-12 NOTE — Assessment & Plan Note (Signed)
 Acute on chronic low back pain.  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  She is wearing a back brace which I discussed could limit mobility and also weaken her paraspinal muscles with long-term use.  Will send in low-dose Flexeril and PT referral.  Holding off on meloxicam given initiation of Eliquis as below.

## 2023-05-12 NOTE — Assessment & Plan Note (Signed)
 Murmur appreciated today on exam.  Reassuringly without acute symptoms, though she does endorse intermittent shortness of breath is exertion.  While COPD is most certainly contributing, will update echo to assess integrity of mitral valve.  Referring to cardiology as well as below.

## 2023-05-12 NOTE — Progress Notes (Signed)
 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 Feels like lifting this baby may have pulled a muscle. Pain is in the lower right side of her back. Worse when moving. Tender to this touch. She has been wearing a back brace all the time at home. She has had this last time and pills helped. No motor or sensory dysfunction, no difficult walking, no loss of bowel or bladder function.  Nasal congestion, sick symptoms Recently positive for flu A. She has had cold symptoms for the last 2 weeks. Has also had coughing that has resolved. Has had some sore throat that is better with lozenges. Still have runny nose. No fevers. No constipation, diarrhea, dysuria.  Mitral valve regurgitation Has a history of mitral regurg last seen on echo in 2014 according to chart review. She has not had follow up for this. She does endorse SOB with exertion, though she has always attributed this to her COPD and now with the flu. She denies chest pain or severe BLE swelling.  She has noticed at times having fluttering in her chest, though this is not common.  PERTINENT  PMH / PSH: Hypertension, COPD, sensorineural hearing loss, CKD 3 AA, primary open-angle glaucoma, IDA  OBJECTIVE:   BP 138/75   Pulse 89   Temp (!) 97.4 F (36.3 C)   Ht 5\' 2"  (1.575 m)   Wt 193 lb 6.4 oz (87.7 kg)   SpO2 98%   BMI 35.37 kg/m   General: Alert and oriented, in NAD Skin: Warm, dry, and intact without lesions or rashes HEENT: NCAT, EOM grossly normal, midline nasal septum Cardiac: Irregularly irregular, 3 out of 6 holosystolic murmur appreciated throughout, radial pulses irregular Respiratory: CTAB, breathing and speaking comfortably on RA, clear nasal drainage Abdominal: Soft, nontender, nondistended, normoactive bowel sounds Neurological/MSK: No gross focal deficit, pain to palpation over right lumbar paraspinal muscles, no discomfort  with twisting of the torso, negative straight leg raise bilaterally, gait at baseline Psychiatric: Appropriate mood and affect   ASSESSMENT/PLAN:   Low back pain Acute on chronic low back pain.  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  She is wearing a back brace which I discussed could limit mobility and also weaken her paraspinal muscles with long-term use.  Will send in low-dose Flexeril and PT referral.  Holding off on meloxicam given initiation of Eliquis as below.  Influenza A Recently tested positive at urgent care.  Given symptoms for around 2 weeks, she is likely getting over initial infection and would not benefit from Tamiflu.  Reassuringly no focal findings concerning for pneumonia on exam.  To treat symptomatic nasal congestion, will send in Flonase and Astelin spray to be used daily.  Advised symptoms should continue to improve.  Mitral regurgitation Murmur appreciated today on exam.  Reassuringly without acute symptoms, though she does endorse intermittent shortness of breath is exertion.  While COPD is most certainly contributing, will update echo to assess integrity of mitral valve.  Referring to cardiology as well as below.  Paroxysmal atrial fibrillation (HCC) New onset on exam and EKG today.  EKG also demonstrates some flutter waves.  Reassuringly asymptomatic at rest though as above has some increased shortness of breath on exertion.  Exam reassuring.  After shared decision making, we will start Eliquis 2.5 mg twice daily as well as diltiazem  120 mg for rate control.  Discussed risk of increased bleeding and lower blood pressure for which she will be on the look out for.  Will also send to cardiology for further recommendations.   [name] Holmes, MD [facility]

## 2023-05-12 NOTE — Assessment & Plan Note (Signed)
 New onset on exam and EKG today.  EKG also demonstrates some flutter waves.  Reassuringly asymptomatic at rest though as above has some increased shortness of breath on exertion.  Exam reassuring.  After shared decision making, we will start Eliquis 2.5 mg twice daily as well as diltiazem 120 mg for rate control.  Discussed risk of increased bleeding and lower blood pressure for which she will be on the look out for.  Will also send to cardiology for further recommendations.

## 2023-05-12 NOTE — Assessment & Plan Note (Signed)
 Recently tested positive at urgent care.  Given symptoms for around 2 weeks, she is likely getting over initial infection and would not benefit from Tamiflu.  Reassuringly no focal findings concerning for pneumonia on exam.  To treat symptomatic nasal congestion, will send in Flonase and Astelin spray to be used daily.  Advised symptoms should continue to improve.

## 2023-05-12 NOTE — Patient Instructions (Signed)
 I have sent in astelin spray and flonase to spray into the nose for your congestion. You should continue to get better over time. Let me know if your symptoms worsen or you have trouble breathing.  I have sent in flexeril and physical therapy for your back. Take half a tablet first for the back and increase to a full one only as needed. Be cautious when moving around on this medication, as it can make you a little more dizzy. Do not drive.  I placed you on eliquis and diltiazem for your new onset atrial fibrillation. I have also sent in a referral for cardiology. Be sure to call 911 if your heart rate increases and you have fluttering in your chest, chest pain, or shortness of breath for more than a minute or two.

## 2023-07-04 ENCOUNTER — Other Ambulatory Visit: Payer: Self-pay | Admitting: Family Medicine

## 2023-07-04 ENCOUNTER — Other Ambulatory Visit: Payer: Self-pay | Admitting: Student

## 2023-07-04 DIAGNOSIS — I10 Essential (primary) hypertension: Secondary | ICD-10-CM

## 2023-08-05 ENCOUNTER — Encounter: Payer: Self-pay | Admitting: *Deleted

## 2023-10-02 ENCOUNTER — Other Ambulatory Visit: Payer: Self-pay | Admitting: Family Medicine

## 2023-10-02 DIAGNOSIS — Z1231 Encounter for screening mammogram for malignant neoplasm of breast: Secondary | ICD-10-CM

## 2023-10-11 NOTE — ED Provider Notes (Signed)
 ------------------------------------------------------------------------------- Attestation signed by Katheryn Rollo Jewels, DO at 10/11/23 2108 Patient was seen by PA or NP and I was not involved in patient's work-up, care plan, treatment or disposition.  I was available for consultation at all points during patient's visit.   -------------------------------------------------------------------------------   [facility]  ED Provider Note  [name] [name] 83 y.o. female DOB: 08/02/1940 MRN: [id number] History   Chief Complaint  Patient presents with  . Foot Pain    Onset onset a week ago. Patient also endorses left foot swelling. Patient states there was swelling to her leg but now it's just her foot. Patient denies SHOB, not on blood thinners, denies hx of CHF. Patient states she does have a hx of blood clots years ago when she was having her children.  At baseline patient walks independently and works at a daycare.    83 year old female with history of anemia, arthritis, GERD, glaucoma, hypertension presents for evaluation of left lower extremity pain and swelling.  States her left foot started hurting and swelling about 2 weeks ago.  States it initially improved on its own however about 1 week ago swelling returned again.  Worse on the top of the left foot.  Pain is a constant ache, worse with walking.  Does not remember any falls, injuries or trauma.  No twisting.  Reports remote history of blood clot related to pregnancy many years ago.  Not currently on blood thinners.  Denies any associated chest pain, shortness of breath, new cough, abdominal pain, nausea, vomiting.  No fevers or chills.  Patient states she has been walking despite the pain and swelling and still goes to daycare where she works 1.5 hours every day.  Has been taking Tylenol  for pain only when it is severe.  Denies history of CHF.  States swelling seems to be constant and does not seem to  worsen or improve throughout the day or night.       Past Medical History:  Diagnosis Date  . Anemia   . Arthritis   . GERD (gastroesophageal reflux disease)   . Glaucoma   . Hypertension     History reviewed. No pertinent surgical history.  Social History   Substance and Sexual Activity  Alcohol Use Never   Tobacco Use History[1] E-Cigarettes  . Vaping Use Never User   . Start Date    . Cartridges/Day    . Quit Date     Social History   Substance and Sexual Activity  Drug Use Never         Allergies[2]  Home Medications   ALBUTEROL  (VENTOLIN  IN)    Inhale into the lungs.   ALBUTEROL  SULFATE HFA (PROVENTIL ,VENTOLIN ,PROAIR ) 108 (90 BASE) MCG/ACT INHALER    Inhale one puff to two puffs into the lungs every 6 (six) hours as needed for Wheezing.   ASPIRIN (ASPRI-LOW,ECOTRIN LOW DOSE) 81 MG EC TABLET    Take 81 mg by mouth daily.   DICLOFENAC SODIUM (VOLTAREN) 75 MG EC TABLET    Take one tablet (75 mg dose) by mouth 2 (two) times daily.   FERROUS SULFATE  (FERROUS SULFATE ) 325 (65 FE) MG TABLET    Take 325 mg by mouth with breakfast.   LOSARTAN -HYDROCHLOROTHIAZIDE  (HYZAAR) 50-12.5 MG PER TABLET    Take 1 tablet by mouth daily.    Primary Survey  Primary Survey  Review of Systems   Review of Systems  Constitutional: Negative.   HENT: Negative.    Respiratory: Negative.  Cardiovascular:  Positive for leg swelling.  Gastrointestinal: Negative.   Genitourinary: Negative.   Musculoskeletal:  Positive for arthralgias and myalgias.  Skin: Negative.   Neurological: Negative.   All other systems reviewed and are negative.   Physical Exam   ED Triage Vitals [10/11/23 1429]  BP (!) 177/75  Heart Rate 74  Resp 18  SpO2 96 %  Temp 97.1 F (36.2 C)    Physical Exam  Vitals reviewed. Constitutional: She appears well-developed and well-nourished. She is in good hygiene. She has good hygiene. She does not appear distressed, does not appear ill and no  respiratory distress. Not diaphoretic. HENT:  Head: Normocephalic and atraumatic.  Mouth/Throat: Voice normal.  Eyes: Conjunctivae are normal.  Neck: Normal range of motion and voice normal. Neck supple.  Cardiovascular: Normal rate, regular rhythm and intact distal pulses.  Audible murmur.  Pulmonary/Chest: No respiratory distress. Good air movement. Not tachypneic. Respiratory effort normal and breath sounds normal.  Abdominal: Soft. There is no abdominal tenderness. There is no guarding. Abdomen not distended.  Musculoskeletal: Normal range of motion.     Right hip: Normal.     Left hip: Normal.     Right knee: Normal.     Left knee: Normal. Normal range of motion.     Right ankle: Normal.     Left ankle: Normal. No tenderness. Normal range of motion. Normal pulse.     Left Achilles Tendon: Normal.     Cervical back: Normal range of motion and neck supple.     Right lower leg: Normal.     Left lower leg: Tenderness present. No swelling or bony tenderness.     Right foot: Normal.     Left foot: Normal range of motion and normal capillary refill. Swelling present. No deformity, tenderness or bony tenderness. Normal pulse.     Comments: Swelling to the dorsal aspect diffusely of the left foot.  No associated erythema, warmth or significant tenderness.  Maintains range of motion in the toes, ankle and knee without pain or difficulty.  Pedal and DP pulses are palpable bilaterally.  Cap refill intact.  No open wounds.   Neurological: She is alert and oriented to person, place, and time. Moves all extremities equally. She has normal speech. Sensation intact to light touch, bilateral upper and lower extremities. Strength 5/5 bilateral upper and lower extremities.  Skin: Skin is warm. Not diaphoretic. Skin is dry.  Psychiatric: Her behavior is normal.     ED Course   Lab results:   CBC AND DIFFERENTIAL - Abnormal      Result Value   WBC 9.8     RBC 4.78     HGB 11.8     HCT 37.8      MCV 79.1 (*)    MCH 24.7 (*)    MCHC 31.2 (*)    Plt Ct 215     RDW SD 44.0     MPV 11.6     NRBC% 0.0     Absolute NRBC Count 0.00     NEUTROPHIL % 73.1     LYMPHOCYTE % 16.3     MONOCYTE % 7.1     Eosinophil % 3.0     BASOPHIL % 0.2     IG% 0.3     ABSOLUTE NEUTROPHIL COUNT 7.13     ABSOLUTE LYMPHOCYTE COUNT 1.59     Absolute Monocyte Count 0.69     Absolute Eosinophil Count 0.29     Absolute Basophil  Count 0.02     Absolute Immature Granulocyte Count 0.03    COMPREHENSIVE METABOLIC PANEL - Abnormal   Na 140     Potassium 4.8     Cl 103     CO2 27     AGAP 10     Glucose 90     BUN 17     Creatinine 1.24 (*)    Ca 9.6     ALK PHOS 95     T Bili 0.3     Total Protein 7.5     Alb 3.8     GLOBULIN 3.7     ALBUMIN/GLOBULIN RATIO 1.0 (*)    BUN/CREAT RATIO 13.7     ALT 6     AST 12     eGFR 44 (*)    Comment: Normal GFR (glomerular filtration rate) > 60 mL/min/1.73 meters squared, < 60 may include impaired kidney function. Calculation based on the Chronic Kidney Disease Epidemiology Collaboration (CK-EPI)equation refit without adjustment for race.  NT-PROBNP - Normal   NT-ProBNP 320     Comment: Among patients with dyspnea, NT-proBNPis highly sensitive for the detection of acute congestive heart failure.  In addition, a NT-proBNP <300 pg/mL effectively rules out acute congestive heart failure, with 98% negative predictive value.   PROTIME-INR - Normal   PT 14.0     Comment: Many commonly administered drugs may affect the results obtained in PT and PTT testing.   INR 1.1     Comment: INR Therapeutic Range for DVT, PE:      2.0 - 3.0 INR Mechanical Prosthetic Heart Valves: 2.5 - 3.5  PTT - Normal   PTT 33     Comment: Many commonly administered drugs may affect the results obtained in PT and PTT testing.    Imaging:   XR FOOT MIN 3 VIEWS LEFT   Narrative:    INDICATION: Swelling  COMPARISON: None  TECHNIQUE: Left foot 3 views  FINDINGS: Hallux  valgus. Soft tissue swelling about the 1st MTP joint. Erosive changes of the 1st metatarsal head suggesting gout. No fracture.    Impression:    IMPRESSION: Hallux valgus. Soft tissue swelling about the 1st MTP joint. Erosive changes of the 1st metatarsal head suggesting gout. No fracture.  Electronically Signed by: Sheppard Charm, MD on 10/11/2023 4:18 PM  US  VENOUS LOWER EXTREMITY LEFT   Narrative:    TECHNIQUE: The veins of the left lower extremity were interrogated from the visible common femoral vein to the distal popliteal vein.  The junction of the greater saphenous with the common femoral vein as well as the posterior tibial veins were evaluated.  Gray scale, color, spectral, and doppler sonography was utilized and analyzed. COMPARISON: None.   INDICATION: Swelling    FINDINGS: Flow is present in all interrogated vessels and there are no internal echoes.  Normal venous compressibility is demonstrated and there is augmentation of flow with appropriate maneuvers.    INCIDENTAL FINDINGS:  None    Impression:    IMPRESSION:  No evidence of deep venous thrombosis.  Electronically Signed by: Trenda Blanch, MD on 10/11/2023 5:06 PM      ECG: ECG Results   None  Pre-Sedation Procedures    Medical Decision Making Patient presents for left lower extremity swelling.  Has pedal edema to the left foot on exam.  Will check x-rays, ultrasound left lower extremity, labs.  Vitals are hemodynamically stable and afebrile.  Tylenol  for pain.  Differential includes was not limited to DVT, venous stasis, dependent edema, cellulitis, ankle sprain.  Distally neurovascular intact.  No focal bony tenderness.  No open wounds.  Labs are reassuring.  Creatinine mildly elevated but at baseline compared to labs in Care Everywhere.  No leukocytosis no erythema less likely cellulitic or infected joint.   Maintains range of motion.  BNP within normal limits.  Ultrasound lower extremity has no evidence of DVT.  X-ray of the left foot shows soft tissue swelling about the first MTP joint with erosive changes of the first metatarsal head suggesting gout per radiology.  There is no fracture.  Also hallux valgus.  Suspect symptoms may be related to gout.  No fevers, no leukocytosis and no erythema to suggest infectious etiology at this time.  Patient has baseline decreased renal function, avoiding NSAIDs.  Will treat with prednisone .  Patient confirms she is not diabetic.  With otherwise reassuring exam and workup she is safe for discharge home with outpatient follow-up.  Instructed to call primary care this week.  Patient was reassessed sitting upright in bedside chair in no acute pain or distress.  Patient demonstrates understanding and is happy agreeable to the plan.  All questions were answered she was discharged home in stable condition.  Briefly discussed with attending Dr. Moody who was in agreement with assessment and plan.  Amount and/or Complexity of Data Reviewed Labs: ordered. Radiology: ordered.  Risk OTC drugs. Prescription drug management.            Provider Communication  New Prescriptions   PREDNISONE  (DELTASONE ) 20 MG TABLET    Take two tablets (40 mg dose) by mouth daily for 5 days.      Quantity: 10 tablet    Refills: 0    Modified Medications   No medications on file    Discontinued Medications   No medications on file    Clinical Impression Final diagnoses:  Swelling of left foot    ED Disposition     ED Disposition  Discharge   Condition  Stable   Comment  --                 Follow-up Information     Go to  [facility] Emergency Department.   Specialty: Emergency Medicine Comments: As needed, If symptoms worsen Contact information: [address] [place] [facility] [place]  [address] [phone] Additional  information: When you arrive, our care team of nurses or providers will be ready to help you. Pre-registering, while not a scheduled appointment, helps us  prepare for your visit, ensuring a smoother and faster evaluation process.                 Electronically signed by:       [1] Social History Tobacco Use  Smoking Status Former  Smokeless Tobacco Never  [2] No Known Allergies  Ozell KATHEE Birmingham, PA-C 10/11/23 1745

## 2023-10-21 NOTE — Telephone Encounter (Signed)
 Sent in timolol RX

## 2023-10-26 ENCOUNTER — Encounter: Payer: Self-pay | Admitting: Cardiology

## 2023-10-26 NOTE — Progress Notes (Unsigned)
 Cardiology Office Note   Date:  10/29/2023   ID:  [name], [name] 02/14/1941, MRN [id number]  PCP:  Adele Song, MD  Cardiologist:   None Referring:  Adele Song, MD  Chief Complaint  Patient presents with   Atrial Fibrillation      History of Present Illness: [name] [name] is a 83 y.o. female who presents for evaluation of atrial fib.  This was diagnosed in March.  I reviewed some records from her primary provider.  She was started on Eliquis  2-1/2 twice a day but she does not recall ever getting that or starting it.  She does not feel palpitations.  She does not feel her heart racing or skipping.  She does not have any presyncope or syncope.  She lives with her husband.  She does not do a ton around the house but she does some light housekeeping.  With this she denies any cardiovascular symptoms such as shortness of breath, PND or orthopnea.  She does not have any chest pressure, neck or arm discomfort.  She has a bit of a cough with cold-like symptoms recently.     She saw Dr. Alveta in 2017 for evaluation of a murmur.  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  I do not see that she has had follow-up with this.   Past Medical History:  Diagnosis Date   Anemia    Fe def   Arthritis    DIVERTICULOSIS, SIGMOID COLON 03/09/2009   Qualifier: Diagnosis of   By: Ta MD, Cat      Replacing diagnoses that were inactivated after the 05/27/22 regulatory import     GERD (gastroesophageal reflux disease)    Glaucoma    Hypertension    Postmenopausal status     Past Surgical History:  Procedure Laterality Date   CESAREAN SECTION     x2   colonscopy      times 2   Internal hemorrhoids     JOINT REPLACEMENT  2011   left knee replacement   KNEE ARTHROPLASTY  04/19/10   Dr Yvone   Sigmoid Diverticulosis     TOTAL KNEE ARTHROPLASTY Right 01/04/2014   Procedure: RIGHT TOTAL KNEE ARTHROPLASTY;  Surgeon: Tanda DELENA Heading, MD;   Location: [facility];  Service: Orthopedics;  Laterality: Right;   TUBAL LIGATION  1978     Current Outpatient Medications  Medication Sig Dispense Refill   albuterol  (VENTOLIN  HFA) 108 (90 Base) MCG/ACT inhaler TAKE 2 PUFFS BY MOUTH EVERY 6 HOURS AS NEEDED (Patient taking differently: as needed for wheezing. TAKE 2 PUFFS BY MOUTH EVERY 6 HOURS AS NEEDED) 18 each 1   apixaban  (ELIQUIS ) 5 MG TABS tablet Take 1 tablet (5 mg total) by mouth 2 (two) times daily. 180 tablet 3   azelastine  (ASTELIN ) 0.1 % nasal spray Place 1 spray into both nostrils 2 (two) times daily. Use in each nostril as directed (Patient taking differently: Place 1 spray into both nostrils as needed for allergies. Use in each nostril as directed) 30 mL 2   azithromycin  (ZITHROMAX  Z-PAK) 250 MG tablet Take 2 tablets (500 mg total) by mouth daily for 1 day, THEN 1 tablet (250 mg total) daily for 4 days. 6 tablet 0   losartan -hydrochlorothiazide  (HYZAAR) 50-12.5 MG tablet TAKE 1 TABLET BY MOUTH DAILY. NEEDS PCP FOLLOW UP. 90 tablet 0   cyclobenzaprine  (FLEXERIL ) 5 MG tablet Take 0.5 tablets (2.5 mg  total) by mouth at bedtime as needed for muscle spasms. Increase to 5 mg at bedtime as needed only if symptoms not controlled. (Patient not taking: Reported on 10/29/2023) 30 tablet 1   diltiazem  (CARDIZEM  SR) 120 MG 12 hr capsule TAKE 1 CAPSULE BY MOUTH EVERY DAY (Patient not taking: Reported on 10/29/2023) 90 capsule 0   fluticasone  (FLONASE ) 50 MCG/ACT nasal spray Place 2 sprays into both nostrils daily. (Patient not taking: Reported on 10/29/2023) 16 g 6   Fluticasone -Umeclidin-Vilant (TRELEGY ELLIPTA ) 100-62.5-25 MCG/ACT AEPB Inhale 1 puff into the lungs daily. (Patient not taking: Reported on 10/29/2023) 28 each 1   latanoprost (XALATAN) 0.005 % ophthalmic solution Apply to eye. (Patient not taking: Reported on 10/29/2023)     polyethylene glycol (MIRALAX  / GLYCOLAX ) 17 g packet Take 17 g by mouth daily. (Patient not taking: Reported on 10/29/2023)      Riboflavin (VITAMIN B-2 PO) Take by mouth daily. (Patient not taking: Reported on 10/29/2023)     timolol (TIMOPTIC) 0.5 % ophthalmic solution APPLY 1 DROP(S) IN BOTH EYES ONCE A DAY (Patient not taking: Reported on 10/29/2023)  5   No current facility-administered medications for this visit.    Allergies:   Ace inhibitors    Social History:  The patient  reports that she quit smoking about 14 years ago. Her smoking use included cigarettes. She started smoking about 47 years ago. She has a 33 pack-year smoking history. She has never used smokeless tobacco. She reports that she does not drink alcohol and does not use drugs.   Family History:  The patient's family history includes Hypertension in her father; Prostate cancer in her brother; Stroke in her father.    ROS:  Please see the history of present illness.   Otherwise, review of systems are positive for none.   All other systems are reviewed and negative.    PHYSICAL EXAM: VS:  BP 121/64 (BP Location: Right Arm, Patient Position: Sitting, Cuff Size: Normal)   Pulse 65   Ht 5' 2 (1.575 m)   Wt 181 lb 9.6 oz (82.4 kg)   SpO2 96%   BMI 33.22 kg/m  , BMI Body mass index is 33.22 kg/m. GENERAL:  Well appearing HEENT:  Pupils equal round and reactive, fundi not visualized, oral mucosa unremarkable NECK:  No jugular venous distention, waveform within normal limits, carotid upstroke brisk and symmetric, no bruits, no thyromegaly LYMPHATICS:  No cervical, inguinal adenopathy LUNGS:  Clear to auscultation bilaterally BACK:  No CVA tenderness CHEST:  Unremarkable HEART:  PMI not displaced or sustained,S1 and S2 within normal limits, no S3, no S4, no clicks, no rubs, 3 out of 6 holosystolic murmur heard at the left sternal border and radiating to the axilla, no diastolic murmurs ABD:  Flat, positive bowel sounds normal in frequency in pitch, no bruits, no rebound, no guarding, no midline pulsatile mass, no hepatomegaly, no  splenomegaly EXT:  2 plus pulses throughout, no edema, no cyanosis no clubbing SKIN:  No rashes no nodules NEURO:  Cranial nerves II through XII grossly intact, motor grossly intact throughout [facility]:  Cognitively intact, oriented to person place and time    EKG:  EKG Interpretation Date/Time:  Wednesday October 29 2023 11:36:00 EDT Ventricular Rate:  77 PR Interval:  136 QRS Duration:  104 QT Interval:  356 QTC Calculation: 402 R Axis:   59  Text Interpretation: Sinus rhythm with marked sinus arrhythmia Minimal voltage criteria for LVH, may be normal variant ( Cornell product )  When compared with ECG of 12-May-2023 11:29, Sinus rhythm has replaced Atrial fibrillation Confirmed by Lavona Agent ([id number]) on 10/29/2023 11:49:49 AM     Recent Labs: No results found for requested labs within last 365 days.    Lipid Panel    Component Value Date/Time   CHOL 180 10/24/2022 1702   TRIG 79 10/24/2022 1702   HDL 51 10/24/2022 1702   CHOLHDL 3.5 10/24/2022 1702   CHOLHDL 4.0 10/12/2012 1038   VLDL 19 10/12/2012 1038   LDLCALC 114 (H) 10/24/2022 1702   LDLDIRECT 100 (H) 11/16/2013 1556      Wt Readings from Last 3 Encounters:  10/29/23 181 lb 9.6 oz (82.4 kg)  05/12/23 193 lb 6.4 oz (87.7 kg)  02/05/23 198 lb 8 oz (90 kg)      Other studies Reviewed: Additional studies/ records that were reviewed today include: Primary care records, previous echocardiogram, previous EKG. Review of the above records demonstrates:  Please see elsewhere in the note.     ASSESSMENT AND PLAN:   Mitral regurgitation: She needs an echocardiogram.  I would not be surprised to see that she has severe MR and might eventually need management of this.  We began that conversation.  Atrial fib: She is not aware of this rhythm.  She is actually in sinus today.  I am going to apply a 3-day Zio patch.  I explained the importance of anticoagulation.  The appropriate dose would be 5 mg twice a day based on  her blood work earlier this year.  She has no bleeding diathesis or contraindication.  I will start Eliquis .  I will check a CBC and a basic metabolic profile in 1 month.  Current medicines are reviewed at length with the patient today.  The patient does not have concerns regarding medicines.  The following changes have been made: As above  Labs/ tests ordered today include:   Orders Placed This Encounter  Procedures   CBC   Basic metabolic panel with GFR   LONG TERM MONITOR (3-14 DAYS)   EKG 12-Lead   ECHOCARDIOGRAM COMPLETE     Disposition:   FU with me in 6 weeks   Signed, Agent Lavona, MD  10/29/2023 11:51 AM    [facility]

## 2023-10-29 ENCOUNTER — Ambulatory Visit: Attending: Cardiovascular Disease | Admitting: Cardiology

## 2023-10-29 ENCOUNTER — Other Ambulatory Visit (HOSPITAL_COMMUNITY): Payer: Self-pay

## 2023-10-29 ENCOUNTER — Ambulatory Visit (INDEPENDENT_AMBULATORY_CARE_PROVIDER_SITE_OTHER)

## 2023-10-29 VITALS — BP 121/64 | HR 65 | Ht 62.0 in | Wt 181.6 lb

## 2023-10-29 DIAGNOSIS — I10 Essential (primary) hypertension: Secondary | ICD-10-CM

## 2023-10-29 DIAGNOSIS — I340 Nonrheumatic mitral (valve) insufficiency: Secondary | ICD-10-CM | POA: Diagnosis not present

## 2023-10-29 DIAGNOSIS — I4891 Unspecified atrial fibrillation: Secondary | ICD-10-CM | POA: Diagnosis not present

## 2023-10-29 MED ORDER — AZITHROMYCIN 250 MG PO TABS
ORAL_TABLET | ORAL | 0 refills | Status: AC
  Filled 2023-10-29: qty 6, 5d supply, fill #0

## 2023-10-29 MED ORDER — APIXABAN 5 MG PO TABS
5.0000 mg | ORAL_TABLET | Freq: Two times a day (BID) | ORAL | 3 refills | Status: AC
  Filled 2023-10-29: qty 60, 30d supply, fill #0
  Filled 2023-10-29: qty 180, 90d supply, fill #0

## 2023-10-29 NOTE — Patient Instructions (Addendum)
 Medication Instructions:  Z pak Eliquis  5 mg twice daily *If you need a refill on your cardiac medications before your next appointment, please call your pharmacy*  Lab Work: BMET, CBC in 1 month at [facility] If you have labs (blood work) drawn today and your tests are completely normal, you will receive your results only by: MyChart Message (if you have MyChart) OR A paper copy in the mail If you have any lab test that is abnormal or we need to change your treatment, we will call you to review the results.  Testing/Procedures: Echocardiogram Your physician has requested that you have an echocardiogram. Echocardiography is a painless test that uses sound waves to create images of your heart. It provides your doctor with information about the size and shape of your heart and how well your heart's chambers and valves are working. This procedure takes approximately one hour. There are no restrictions for this procedure. Please do NOT wear cologne, perfume, aftershave, or lotions (deodorant is allowed). Please arrive 15 minutes prior to your appointment time.  Please note: We ask at that you not bring children with you during ultrasound (echo/ vascular) testing. Due to room size and safety concerns, children are not allowed in the ultrasound rooms during exams. Our front office staff cannot provide observation of children in our lobby area while testing is being conducted. An adult accompanying a patient to their appointment will only be allowed in the ultrasound room at the discretion of the ultrasound technician under special circumstances. We apologize for any inconvenience.  Zio Heart Monitor (3 days) Your physician has requested that you wear a Zio heart monitor for ___3__ days. This will be mailed to your home with instructions on how to apply the monitor and how to return it when finished. Please allow 2 weeks after returning the heart monitor before our office calls you with the results.     Follow-Up: At [facility], you and your health needs are our priority.  As part of our continuing mission to provide you with exceptional heart care, our providers are all part of one team.  This team includes your primary Cardiologist (physician) and Advanced Practice Providers or APPs (Physician Assistants and Nurse Practitioners) who all work together to provide you with the care you need, when you need it.  Your next appointment:   6 weeks  Provider:   Lavona, MD  We recommend signing up for the patient portal called MyChart.  Sign up information is provided on this After Visit Summary.  MyChart is used to connect with patients for Virtual Visits (Telemedicine).  Patients are able to view lab/test results, encounter notes, upcoming appointments, etc.  Non-urgent messages can be sent to your provider as well.   To learn more about what you can do with MyChart, go to [email or web address].   ZIO XT- Long Term Monitor Instructions  Your physician has requested you wear a ZIO patch monitor for 3 days.  This is a single patch monitor. [employer] supplies one patch monitor per enrollment. Additional stickers are not available. Please do not apply patch if you will be having a Nuclear Stress Test,  Echocardiogram, Cardiac CT, MRI, or Chest Xray during the period you would be wearing the  monitor. The patch cannot be worn during these tests. You cannot remove and re-apply the  ZIO XT patch monitor.  Your ZIO patch monitor will be mailed 3 day USPS to your address on file. It may take 3-5 days  to receive  your monitor after you have been enrolled.  Once you have received your monitor, please review the enclosed instructions. Your monitor  has already been registered assigning a specific monitor serial # to you.  Billing and Patient Assistance Program Information  We have supplied [employer] with any of your insurance information on file for billing purposes. [employer] offers a  sliding scale Patient Assistance Program for patients that do not have  insurance, or whose insurance does not completely cover the cost of the ZIO monitor.  You must apply for the Patient Assistance Program to qualify for this discounted rate.  To apply, please call [employer] at [phone], select option 4, select option 2, ask to apply for  Patient Assistance Program. Meredeth will ask your household income, and how many people  are in your household. They will quote your out-of-pocket cost based on that information.  [employer] will also be able to set up a 6-month, interest-free payment plan if needed.  Applying the monitor   Shave hair from upper left chest.  Hold abrader disc by orange tab. Rub abrader in 40 strokes over the upper left chest as  indicated in your monitor instructions.  Clean area with 4 enclosed alcohol pads. Let dry.  Apply patch as indicated in monitor instructions. Patch will be placed under collarbone on left  side of chest with arrow pointing upward.  Rub patch adhesive wings for 2 minutes. Remove white label marked 1. Remove the white  label marked 2. Rub patch adhesive wings for 2 additional minutes.  While looking in a mirror, press and release button in center of patch. A small green light will  flash 3-4 times. This will be your only indicator that the monitor has been turned on.  Do not shower for the first 24 hours. You may shower after the first 24 hours.  Press the button if you feel a symptom. You will hear a small click. Record Date, Time and  Symptom in the Patient Logbook.  When you are ready to remove the patch, follow instructions on the last 2 pages of Patient  Logbook. Stick patch monitor onto the last page of Patient Logbook.  Place Patient Logbook in the blue and white box. Use locking tab on box and tape box closed  securely. The blue and white box has prepaid postage on it. Please place it in the mailbox as  soon as possible. Your physician  should have your test results approximately 7 days after the  monitor has been mailed back to [facility].  Call [facility] Customer Care at [phone] if you have questions regarding  your ZIO XT patch monitor. Call them immediately if you see an orange light blinking on your  monitor.  If your monitor falls off in less than 4 days, contact our Monitor department at [phone].  If your monitor becomes loose or falls off after 4 days call [employer] at [phone] for  suggestions on securing your monitor

## 2023-10-29 NOTE — Progress Notes (Unsigned)
 Enrolled patient for a 3 day Zio XT monitor to be mailed to patients home  [id number] mailed to patient and applied in office.

## 2023-11-04 ENCOUNTER — Telehealth: Payer: Self-pay | Admitting: Cardiology

## 2023-11-04 NOTE — Telephone Encounter (Signed)
 Patient would like to set up an appointment to have her heart monitor put on. Patient will leave for work at 3:30 pm

## 2023-11-04 NOTE — Telephone Encounter (Signed)
 Patient scheduled to come to [facility] office 11/05/23 at 11:00 am to have her ZIO monitor applied. sw

## 2023-11-05 ENCOUNTER — Ambulatory Visit: Attending: Cardiovascular Disease

## 2023-11-13 DIAGNOSIS — I4891 Unspecified atrial fibrillation: Secondary | ICD-10-CM | POA: Diagnosis not present

## 2023-11-16 ENCOUNTER — Ambulatory Visit: Payer: Self-pay | Admitting: Cardiology

## 2023-11-16 DIAGNOSIS — I4891 Unspecified atrial fibrillation: Secondary | ICD-10-CM

## 2023-11-20 ENCOUNTER — Other Ambulatory Visit: Payer: Self-pay

## 2023-11-20 DIAGNOSIS — I10 Essential (primary) hypertension: Secondary | ICD-10-CM

## 2023-11-21 ENCOUNTER — Ambulatory Visit (HOSPITAL_COMMUNITY)
Admission: RE | Admit: 2023-11-21 | Discharge: 2023-11-21 | Disposition: A | Discharge status: Home / Self Care | Source: Ambulatory Visit | Attending: Cardiology | Admitting: Cardiology

## 2023-11-21 DIAGNOSIS — I340 Nonrheumatic mitral (valve) insufficiency: Secondary | ICD-10-CM | POA: Diagnosis not present

## 2023-11-21 LAB — ECHOCARDIOGRAM COMPLETE
AR max vel: 1.72 cm2
AV Area VTI: 1.99 cm2
AV Area mean vel: 1.74 cm2
AV Mean grad: 2 mmHg
AV Peak grad: 4.8 mmHg
Ao pk vel: 1.09 m/s
MV M vel: 5.45 m/s
MV Peak grad: 118.8 mmHg
MV VTI: 0.1 cm2
Radius: 0.62 cm
S' Lateral: 3.03 cm

## 2023-11-21 MED ORDER — LOSARTAN POTASSIUM-HCTZ 50-12.5 MG PO TABS
1.0000 | ORAL_TABLET | Freq: Every day | ORAL | 0 refills | Status: DC
Start: 2023-11-21 — End: 2024-01-05

## 2023-11-21 MED ORDER — DILTIAZEM HCL ER 120 MG PO CP12: 120.0000 mg | ORAL_CAPSULE | Freq: Every day | ORAL | 0 refills | Status: DC

## 2023-11-27 NOTE — Telephone Encounter (Signed)
 Spoke with pt regarding her results. Pt would like to speak to Dr. Lavona about the TEE before scheduling the procedure. Pt verbalized understanding. All questions if any were answered. Results sent to PCP.

## 2023-11-27 NOTE — Telephone Encounter (Signed)
-----   Message from Nurse Sherri P sent at 11/27/2023  7:33 AM EDT -----  ----- Message ----- From: Sanjuan Aleck SAILOR, RN Sent: 11/26/2023   9:56 AM EDT To: Maeola LITTIE Domino, RN   ----- Message ----- From: Lavona Agent, MD Sent: 11/22/2023   9:42 AM EDT To: Aleck SAILOR Emiyah Spraggins, RN  EF is normal.  She has severe MR and I will need to schedule a TEE.  I will call and discuss with her.  Please schedule TEE with one of the structural imagers.   Send results to Adele Song, MD ----- Message ----- From: Interface, Three One Seven Sent: 11/21/2023  12:36 PM EDT To: Agent Lavona, MD

## 2023-12-07 NOTE — H&P (View-Only) (Signed)
 Cardiology Office Note:   Date:  12/10/2023  ID:  [name] [name], [name] [name] 03/10/1940, MRN [id number] PCP: Adele Song, MD  [facility] Providers Cardiologist:  None {  History of Present Illness:   [name] [name] is a 83 y.o. female who presents for evaluation of atrial fib.  This was diagnosed in March.  I reviewed some records from her primary provider.  She was started on Eliquis  2-1/2 twice a day but she does not recall ever getting that or starting it.  She does not feel palpitations.  She does not feel her heart racing or skipping.  She does not have any presyncope or syncope.  She lives with her husband.  She does not do a ton around the house but she does some light housekeeping.  With this she denies any cardiovascular symptoms such as shortness of breath, PND or orthopnea.  She does not have any chest pressure, neck or arm discomfort.  She has a bit of a cough with cold-like symptoms recently.      She saw Dr. Alveta in 2017 for evaluation of a murmur.  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  I do not see that she has had follow-up with this.  I sent her for an echocardiogram and she has severe mitral regurgitation.  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  She feels well.  The patient denies any new symptoms such as chest discomfort, neck or arm discomfort. There has been no new shortness of breath, PND or orthopnea. There have been no reported palpitations, presyncope or syncope. She does not notice her atrial fib.   ROS: As stated in the HPI and negative for all other systems.  Studies Reviewed:    EKG:     NA   Risk Assessment/Calculations:    CHA2DS2-VASc Score = 3   This indicates a 3.2% annual risk of stroke. The patient's score is based upon: CHF History: 0 HTN History: 0 Diabetes History: 0 Stroke History: 0 Vascular Disease History: 0 Age Score: 2 Gender Score: 1    Physical Exam:    VS:  BP (!) 120/52   Pulse 60   Ht 5' 3 (1.6 m)   Wt 197 lb 11.2 oz (89.7 kg)   SpO2 97%   BMI 35.02 kg/m    Wt Readings from Last 3 Encounters:  12/10/23 197 lb 11.2 oz (89.7 kg)  10/29/23 181 lb 9.6 oz (82.4 kg)  05/12/23 193 lb 6.4 oz (87.7 kg)     GEN: Well nourished, well developed in no acute distress NECK: No JVD; No carotid bruits CARDIAC: IrregularRR, 3 out of 6 holosystolic murmur heard at the apex and in the axilla, no diastolic murmurs, rubs, gallops RESPIRATORY:  Clear to auscultation without rales, wheezing or rhonchi  ABDOMEN: Soft, non-tender, non-distended EXTREMITIES:  No edema; No deformity   ASSESSMENT AND PLAN:   Mitral regurgitation:   She has severe MR. Today I discussed with her risk benefits of TEE and this will be arranged.     Atrial fib:     She is not aware of this rhythm.  She is actually in sinus today.  I am going to apply a 3-day Zio patch.  I explained the importance of anticoagulation.  The appropriate dose would be 5 mg twice a day based on her blood work earlier this year.  She has no bleeding diathesis or contraindication.  I will start Eliquis .  I will check a CBC and a basic metabolic profile in 1 month.     Follow up with me after the TEE.   Signed, Lynwood Schilling, MD

## 2023-12-07 NOTE — Progress Notes (Addendum)
 Cardiology Office Note:   Date:  12/10/2023  ID:  [name] [name], [name] [name] 03/10/1940, MRN [id number] PCP: Adele Song, MD  [facility] Providers Cardiologist:  None {  History of Present Illness:   [name] [name] is a 83 y.o. female who presents for evaluation of atrial fib.  This was diagnosed in March.  I reviewed some records from her primary provider.  She was started on Eliquis  2-1/2 twice a day but she does not recall ever getting that or starting it.  She does not feel palpitations.  She does not feel her heart racing or skipping.  She does not have any presyncope or syncope.  She lives with her husband.  She does not do a ton around the house but she does some light housekeeping.  With this she denies any cardiovascular symptoms such as shortness of breath, PND or orthopnea.  She does not have any chest pressure, neck or arm discomfort.  She has a bit of a cough with cold-like symptoms recently.      She saw Dr. Alveta in 2017 for evaluation of a murmur.  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  I do not see that she has had follow-up with this.  I sent her for an echocardiogram and she has severe mitral regurgitation.  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  She feels well.  The patient denies any new symptoms such as chest discomfort, neck or arm discomfort. There has been no new shortness of breath, PND or orthopnea. There have been no reported palpitations, presyncope or syncope. She does not notice her atrial fib.   ROS: As stated in the HPI and negative for all other systems.  Studies Reviewed:    EKG:     NA   Risk Assessment/Calculations:    CHA2DS2-VASc Score = 3   This indicates a 3.2% annual risk of stroke. The patient's score is based upon: CHF History: 0 HTN History: 0 Diabetes History: 0 Stroke History: 0 Vascular Disease History: 0 Age Score: 2 Gender Score: 1    Physical Exam:    VS:  BP (!) 120/52   Pulse 60   Ht 5' 3 (1.6 m)   Wt 197 lb 11.2 oz (89.7 kg)   SpO2 97%   BMI 35.02 kg/m    Wt Readings from Last 3 Encounters:  12/10/23 197 lb 11.2 oz (89.7 kg)  10/29/23 181 lb 9.6 oz (82.4 kg)  05/12/23 193 lb 6.4 oz (87.7 kg)     GEN: Well nourished, well developed in no acute distress NECK: No JVD; No carotid bruits CARDIAC: IrregularRR, 3 out of 6 holosystolic murmur heard at the apex and in the axilla, no diastolic murmurs, rubs, gallops RESPIRATORY:  Clear to auscultation without rales, wheezing or rhonchi  ABDOMEN: Soft, non-tender, non-distended EXTREMITIES:  No edema; No deformity   ASSESSMENT AND PLAN:   Mitral regurgitation:   She has severe MR. Today I discussed with her risk benefits of TEE and this will be arranged.     Atrial fib:     She is not aware of this rhythm.  She is actually in sinus today.  I am going to apply a 3-day Zio patch.  I explained the importance of anticoagulation.  The appropriate dose would be 5 mg twice a day based on her blood work earlier this year.  She has no bleeding diathesis or contraindication.  I will start Eliquis .  I will check a CBC and a basic metabolic profile in 1 month.     Follow up with me after the TEE.   Signed, Lynwood Schilling, MD

## 2023-12-10 ENCOUNTER — Encounter: Payer: Self-pay | Admitting: Cardiology

## 2023-12-10 ENCOUNTER — Ambulatory Visit: Attending: Cardiovascular Disease | Admitting: Cardiology

## 2023-12-10 VITALS — BP 120/52 | HR 60 | Ht 63.0 in | Wt 197.7 lb

## 2023-12-10 DIAGNOSIS — Z01812 Encounter for preprocedural laboratory examination: Secondary | ICD-10-CM | POA: Diagnosis not present

## 2023-12-10 DIAGNOSIS — I340 Nonrheumatic mitral (valve) insufficiency: Secondary | ICD-10-CM | POA: Diagnosis not present

## 2023-12-10 DIAGNOSIS — I4891 Unspecified atrial fibrillation: Secondary | ICD-10-CM | POA: Diagnosis not present

## 2023-12-10 NOTE — Patient Instructions (Addendum)
 Medication Instructions:  Your physician recommends that you continue on your current medications as directed. Please refer to the Current Medication list given to you today.  *If you need a refill on your cardiac medications before your next appointment, please call your pharmacy*  Lab Work: CBC, BMET today at [facility] If you have labs (blood work) drawn today and your tests are completely normal, you will receive your results only by: MyChart Message (if you have MyChart) OR A paper copy in the mail If you have any lab test that is abnormal or we need to change your treatment, we will call you to review the results.  Testing/Procedures: TEE  Follow-Up: At [facility], you and your health needs are our priority.  As part of our continuing mission to provide you with exceptional heart care, our providers are all part of one team.  This team includes your primary Cardiologist (physician) and Advanced Practice Providers or APPs (Physician Assistants and Nurse Practitioners) who all work together to provide you with the care you need, when you need it.  Your next appointment:   2 months  Provider:   Lavona, MD  We recommend signing up for the patient portal called MyChart.  Sign up information is provided on this After Visit Summary.  MyChart is used to connect with patients for Virtual Visits (Telemedicine).  Patients are able to view lab/test results, encounter notes, upcoming appointments, etc.  Non-urgent messages can be sent to your provider as well.   To learn more about what you can do with MyChart, go to [email or web address].   Other Instructions     Dear [name] [name]  You are scheduled for a TEE (Transesophageal Echocardiogram) on Thursday, October 23 with Dr. Francyne.  Please arrive at the [facility] (Main Entrance A) at [facility]: [address] [place], [place] [address] at 7:30 AM (This time is 1 hour(s) before your procedure to ensure your  preparation).   Free valet parking service is available. You will check in at ADMITTING.   *Please Note: You will receive a call the day before your procedure to confirm the appointment time. That time may have changed from the original time based on the schedule for that day.*   DIET:  Nothing to eat or drink after midnight except a sip of water  with medications (see medication instructions below)  MEDICATION INSTRUCTIONS: !!IF ANY NEW MEDICATIONS ARE STARTED AFTER TODAY, PLEASE NOTIFY YOUR PROVIDER AS SOON AS POSSIBLE!!  FYI: Medications such as Semaglutide (Ozempic, [place]), Tirzepatide (Mounjaro, Zepbound), Dulaglutide (Trulicity), etc (GLP1 agonists) AND Canagliflozin (Invokana), Dapagliflozin (Farxiga), Empagliflozin (Jardiance), Ertugliflozin (Steglatro), Bexagliflozin [employer]) or any combination with one of these drugs such as Invokamet (Canagliflozin/Metformin), Synjardy (Empagliflozin/Metformin), etc (SGLT2 inhibitors) must be held around the time of a procedure. This is not a comprehensive list of all of these drugs. Please review all of your medications and talk to your provider if you take any one of these. If you are not sure, ask your provider.    Continue taking your anticoagulant (blood thinner): Apixaban  (Eliquis ).  You will need to continue this after your procedure until you are told by your provider that it is safe to stop.    LABS: today 10/15  FYI:  For your safety, and to allow us  to monitor your vital signs accurately during the surgery/procedure we request: If you have artificial nails, gel coating, SNS etc, please have those removed prior to your surgery/procedure. Not having the nail coverings lanny  removed may result in cancellation or delay of your surgery/procedure.  Your support person will be asked to wait in the waiting room during your procedure.  It is OK to have someone drop you off and come back when you are ready to be discharged.  You cannot  drive after the procedure and will need someone to drive you home.  Bring your insurance cards.  *Special Note: Every effort is made to have your procedure done on time. Occasionally there are emergencies that occur at the hospital that may cause delays. Please be patient if a delay does occur.

## 2023-12-11 LAB — BASIC METABOLIC PANEL WITH GFR
BUN/Creatinine Ratio: 20 (ref 12–28)
BUN: 27 mg/dL (ref 8–27)
CO2: 25 mmol/L (ref 20–29)
Calcium: 9 mg/dL (ref 8.7–10.3)
Chloride: 104 mmol/L (ref 96–106)
Creatinine, Ser: 1.34 mg/dL — AB (ref 0.57–1.00)
Glucose: 71 mg/dL (ref 70–99)
Potassium: 5 mmol/L (ref 3.5–5.2)
Sodium: 141 mmol/L (ref 134–144)
eGFR: 40 mL/min/1.73 — AB (ref >59)

## 2023-12-11 LAB — CBC
Hematocrit: 27.2 % — ABNORMAL LOW (ref 34.0–46.6)
Hemoglobin: 8.2 g/dL — ABNORMAL LOW (ref 11.1–15.9)
MCH: 24.3 pg — ABNORMAL LOW (ref 26.6–33.0)
MCHC: 30.1 g/dL — ABNORMAL LOW (ref 31.5–35.7)
MCV: 81 fL (ref 79–97)
Platelets: 312 x10E3/uL (ref 150–450)
RBC: 3.37 x10E6/uL — ABNORMAL LOW (ref 3.77–5.28)
RDW: 15.4 % (ref 11.7–15.4)
WBC: 13.8 x10E3/uL — ABNORMAL HIGH (ref 3.4–10.8)

## 2023-12-12 ENCOUNTER — Ambulatory Visit
Admission: RE | Admit: 2023-12-12 | Discharge: 2023-12-12 | Disposition: A | Discharge status: Home / Self Care | Source: Ambulatory Visit | Attending: Family Medicine | Admitting: Family Medicine

## 2023-12-12 DIAGNOSIS — Z1231 Encounter for screening mammogram for malignant neoplasm of breast: Secondary | ICD-10-CM

## 2023-12-14 ENCOUNTER — Ambulatory Visit: Payer: Self-pay | Admitting: Cardiology

## 2023-12-15 ENCOUNTER — Ambulatory Visit: Payer: Medicare HMO

## 2023-12-15 VITALS — Ht 63.0 in | Wt 195.0 lb

## 2023-12-15 DIAGNOSIS — Z0000 Encounter for general adult medical examination without abnormal findings: Secondary | ICD-10-CM | POA: Diagnosis not present

## 2023-12-15 NOTE — Patient Instructions (Signed)
 [name],  Thank you for taking the time for your Medicare Wellness Visit. I appreciate your continued commitment to your health goals. Please review the care plan we discussed, and feel free to reach out if I can assist you further.  Medicare recommends these wellness visits once per year to help you and your care team stay ahead of potential health issues. These visits are designed to focus on prevention, allowing your provider to concentrate on managing your acute and chronic conditions during your regular appointments.  Please note that Annual Wellness Visits do not include a physical exam. Some assessments may be limited, especially if the visit was conducted virtually. If needed, we may recommend a separate in-person follow-up with your provider.  Ongoing Care Seeing your primary care provider every 3 to 6 months helps us  monitor your health and provide consistent, personalized care.   Referrals If a referral was made during today's visit and you haven't received any updates within two weeks, please contact the referred provider directly to check on the status.  Recommended Screenings:  Health Maintenance  Topic Date Due   Zoster (Shingles) Vaccine (1 of 2) Never done   DTaP/Tdap/Td vaccine (2 - Tdap) 06/02/2016   Flu Shot  09/26/2023   COVID-19 Vaccine (4 - 2025-26 season) 10/27/2023   Medicare Annual Wellness Visit  12/14/2024   Pneumococcal Vaccine for age over 37  Completed   DEXA scan (bone density measurement)  Completed   Meningitis B Vaccine  Aged Out   Screening for Lung Cancer  Discontinued   Hepatitis C Screening  Discontinued       12/15/2023    9:19 AM  Advanced Directives  Does Patient Have a Medical Advance Directive? No  Would patient like information on creating a medical advance directive? No - Patient declined   Advance Care Planning is important because it: Ensures you receive medical care that aligns with your values, goals, and  preferences. Provides guidance to your family and loved ones, reducing the emotional burden of decision-making during critical moments.  Vision: Annual vision screenings are recommended for early detection of glaucoma, cataracts, and diabetic retinopathy. These exams can also reveal signs of chronic conditions such as diabetes and high blood pressure.  Dental: Annual dental screenings help detect early signs of oral cancer, gum disease, and other conditions linked to overall health, including heart disease and diabetes.  Please see the attached documents for additional preventive care recommendations.

## 2023-12-15 NOTE — Progress Notes (Signed)
 Subjective:   [name] [name] is a 83 y.o. who presents for a Medicare Wellness preventive visit.  As a reminder, Annual Wellness Visits don't include a physical exam, and some assessments may be limited, especially if this visit is performed virtually. We may recommend an in-person follow-up visit with your provider if needed.  Visit Complete: Virtual I connected with  [name] [name] on 12/15/23 by a audio enabled telemedicine application and verified that I am speaking with the correct person using two identifiers.  Patient Location: Home  Provider Location: Home Office  I discussed the limitations of evaluation and management by telemedicine. The patient expressed understanding and agreed to proceed.  Vital Signs: Because this visit was a virtual/telehealth visit, some criteria may be missing or patient reported. Any vitals not documented were not able to be obtained and vitals that have been documented are patient reported.  VideoError- Librarian, academic were attempted between this provider and patient, however failed, due to patient having technical difficulties OR patient did not have access to video capability.  We continued and completed visit with audio only.   Persons Participating in Visit: Patient.  AWV Questionnaire: No: Patient Medicare AWV questionnaire was not completed prior to this visit.  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     12/15/2023    9:19 AM 05/12/2023   11:02 AM 02/05/2023   11:43 AM 12/13/2022    8:49 PM 11/08/2022    9:09 AM 10/24/2022   11:20 AM 01/04/2022   10:25 AM  Advanced Directives   Does Patient Have a Medical Advance Directive? No No No No No No Yes  Type of Advance Directive       Living will  Does patient want to make changes to medical advance directive?       No - Patient declined  Would patient like information on creating a medical advance directive? No - Patient declined No - Patient declined No - Patient declined Yes (MAU/Ambulatory/Procedural Areas - Information given) No - Patient declined No - Patient declined No - Patient declined    Current Medications (verified) Outpatient Encounter Medications as of 12/15/2023  Medication Sig   albuterol  (VENTOLIN  HFA) 108 (90 Base) MCG/ACT inhaler TAKE 2 PUFFS BY MOUTH EVERY 6 HOURS AS NEEDED   apixaban  (ELIQUIS ) 5 MG TABS tablet Take 1 tablet (5 mg total) by mouth 2 (two) times daily.   azelastine  (ASTELIN ) 0.1 % nasal spray Place 1 spray into both nostrils 2 (two) times daily. Use in each nostril as directed   cyclobenzaprine  (FLEXERIL ) 5 MG tablet Take 0.5 tablets (2.5 mg total) by mouth at bedtime as needed for muscle spasms. Increase to 5 mg at bedtime as needed only if symptoms not controlled.   diltiazem  (CARDIZEM  SR) 120 MG 12 hr capsule Take 1 capsule (120 mg total) by mouth daily.   fluticasone  (FLONASE ) 50 MCG/ACT nasal spray Place 2 sprays into both nostrils daily.   Fluticasone -Umeclidin-Vilant (TRELEGY ELLIPTA ) 100-62.5-25 MCG/ACT AEPB Inhale 1 puff into the lungs daily. (Patient taking differently: Inhale 1 puff into the lungs as needed.)   latanoprost (XALATAN) 0.005 % ophthalmic solution  Apply to eye.   losartan -hydrochlorothiazide  (HYZAAR) 50-12.5 MG tablet Take 1 tablet by mouth daily. Needs PCP follow up.   polyethylene glycol (MIRALAX  / GLYCOLAX ) 17 g packet Take 17 g by mouth daily. (Patient taking differently: Take 17 g by mouth as needed.)   Riboflavin (VITAMIN B-2 PO) Take by mouth daily.   timolol (TIMOPTIC) 0.5 % ophthalmic solution APPLY 1 DROP(S) IN BOTH EYES ONCE A DAY   No  facility-administered encounter medications on file as of 12/15/2023.    Allergies (verified) Ace inhibitors   History: Past Medical History:  Diagnosis Date   Anemia    Fe def   Arthritis    DIVERTICULOSIS, SIGMOID COLON 03/09/2009   Qualifier: Diagnosis of   By: Ta MD, Cat      Replacing diagnoses that were inactivated after the 05/27/22 regulatory import     GERD (gastroesophageal reflux disease)    Glaucoma    Hypertension    Postmenopausal status    Past Surgical History:  Procedure Laterality Date   CESAREAN SECTION     x2   colonscopy      times 2   Internal hemorrhoids     JOINT REPLACEMENT  2011   left knee replacement   KNEE ARTHROPLASTY  04/19/10   Dr Yvone   Sigmoid Diverticulosis     TOTAL KNEE ARTHROPLASTY Right 01/04/2014   Procedure: RIGHT TOTAL KNEE ARTHROPLASTY;  Surgeon: Tanda DELENA Heading, MD;  Location: [facility];  Service: Orthopedics;  Laterality: Right;   TUBAL LIGATION  1978   Family History  Problem Relation Age of Onset   Hypertension Father    Stroke Father    Prostate cancer Brother    Breast cancer Neg Hx    Social History   Socioeconomic History   Marital status: Married    Spouse name: Not on file   Number of children: 4   Years of education: Not on file   Highest education level: Not on file  Occupational History   Not on file  Tobacco Use   Smoking status: Former    Current packs/day: 0.00    Average packs/day: 1 pack/day for 33.0 years (33.0 ttl pk-yrs)    Types: Cigarettes    Start date: 02/26/1976    Quit date: 02/25/2009    Years since quitting: 14.8   Smokeless tobacco: Never  Vaping Use   Vaping status: Never Used  Substance and Sexual Activity   Alcohol use: No   Drug use: No   Sexual activity: Yes  Other Topics Concern   Not on file  Social History Narrative   Married, lives with husband and grandson (pt adopted him when he was 29 days old, age 42 now).     Works at [employer]     5 children  (youngest adopted- he doesn't know)   smokes 1-2 pack per day until 2009   no EtOH   Drug use-no         Social Drivers of Health   Financial Resource Strain: Low Risk  (12/15/2023)   Overall Financial Resource Strain (CARDIA)    Difficulty of Paying Living Expenses: Not hard at all  Food Insecurity: No Food Insecurity (12/15/2023)   Hunger Vital Sign    Worried About Running Out of Food in the Last Year: Never true    Ran Out of Food in the Last Year: Never true  Transportation Needs: No Transportation Needs (12/15/2023)   PRAPARE - Transportation  Lack of Transportation (Medical): No    Lack of Transportation (Non-Medical): No  Physical Activity: Sufficiently Active (12/15/2023)   Exercise Vital Sign    Days of Exercise per Week: 5 days    Minutes of Exercise per Session: 30 min  Stress: No Stress Concern Present (12/15/2023)   [employer] of Occupational Health - Occupational Stress Questionnaire    Feeling of Stress: Not at all  Social Connections: Moderately Integrated (12/15/2023)   Social Connection and Isolation Panel    Frequency of Communication with Friends and Family: More than three times a week    Frequency of Social Gatherings with Friends and Family: Three times a week    Attends Religious Services: More than 4 times per year    Active Member of Clubs or Organizations: No    Attends Banker Meetings: Never    Marital Status: Married    Tobacco Counseling Counseling given: Not Answered    Clinical Intake:  Pre-visit preparation completed: Yes  Pain : No/denies pain Pain Score: 0-No pain     BMI - recorded: 34.54 Nutritional Status: BMI > 30  Obese Nutritional Risks: None Diabetes: No  Lab Results  Component Value Date   HGBA1C 5.8 (A) 10/24/2022   HGBA1C 5.3 11/18/2019     How often do you need to have someone help you when you read instructions, pamphlets, or other written materials from your doctor or pharmacy?: 1 -  Never  Interpreter Needed?: No  Information entered by :: Arius Harnois N. Linkon Siverson, LPN.   Activities of Daily Living     12/15/2023    9:19 AM  In your present state of health, do you have any difficulty performing the following activities:  Hearing? 1  Comment HEARING AIDS  Vision? 0  Difficulty concentrating or making decisions? 1  Comment HUSBAND MAKES DECISIONS  Walking or climbing stairs? 1  Comment CANE  Dressing or bathing? 0  Doing errands, shopping? 1  Comment DOES NOT DRIVE  Preparing Food and eating ? N  Using the Toilet? N  In the past six months, have you accidently leaked urine? N  Do you have problems with loss of bowel control? N  Managing your Medications? N  Managing your Finances? N  Housekeeping or managing your Housekeeping? N    Patient Care Team: Adele Song, MD as PCP - General (Family Medicine) Bond, Reyes Mcardle, MD as Referring Physician (Ophthalmology) Lavona Agent, MD as Consulting Physician (Cardiology)  I have updated your Care Teams any recent Medical Services you may have received from other providers in the past year.     Assessment:   This is a routine wellness examination for [name] [name].  Hearing/Vision screen Hearing Screening - Comments:: Patient has hearing difficulty and wears hearing aids. Vision Screening - Comments:: Patient does not wear any corrective lenses/contacts.   Eye exam done by: Reyes Bracket, MD.    Goals Addressed             This Visit's Progress    Patient Stated       To increase my energy level.       Depression Screen     12/15/2023    9:21 AM 05/12/2023   10:57 AM 02/05/2023   11:40 AM 01/13/2023    1:36 PM 11/08/2022    9:10 AM 10/24/2022   11:21 AM 01/04/2022   10:26 AM  PHQ 2/9 Scores  PHQ - 2 Score 0 3 0 1 2 0 0  PHQ- 9  Score 4 11 3 1 5 9  0    Fall Risk     12/15/2023    9:19 AM 05/12/2023   10:56 AM 02/05/2023   11:43 AM 12/13/2022    8:48 PM 11/08/2022    9:11 AM  Fall Risk    Falls in the past year? 0 0 0 0 0  Number falls in past yr: 0 0 0 0 0  Injury with Fall? 0 0 0 0 0  Risk for fall due to : No Fall Risks   No Fall Risks   Follow up Falls evaluation completed   Falls prevention discussed;Education provided;Falls evaluation completed     MEDICARE RISK AT HOME:  Medicare Risk at Home Any stairs in or around the home?: No If so, are there any without handrails?: No Home free of loose throw rugs in walkways, pet beds, electrical cords, etc?: Yes Adequate lighting in your home to reduce risk of falls?: Yes Life alert?: No Use of a cane, walker or w/c?: Yes Grab bars in the bathroom?: Yes Shower chair or bench in shower?: Yes Elevated toilet seat or a handicapped toilet?: Yes  TIMED UP AND GO:  Was the test performed?  No  Cognitive Function: Unable: Due to language barrier, hearing or vision limitations or other.    12/15/2023    9:21 AM  MMSE - Mini Mental State Exam  Not completed: Unable to complete        12/13/2022    8:49 PM  6CIT Screen  What Year? 0 points  What month? 0 points  What time? 0 points  Count back from 20 0 points  Months in reverse 2 points  Repeat phrase 0 points  Total Score 2 points    Immunizations Immunization History  Administered Date(s) Administered   Fluad Quad(high Dose 65+) 11/18/2019, 11/13/2021   Fluad Trivalent(High Dose 65+) 11/08/2022   Influenza Whole 12/14/2008   Influenza,inj,Quad PF,6+ Mos 11/16/2013, 05/03/2015, 01/03/2016, 02/21/2017, 02/28/2018   Influenza-Unspecified 09/25/2012   PFIZER Comirnaty(Gray Top)Covid-19 Tri-Sucrose Vaccine 09/20/2020   PFIZER(Purple Top)SARS-COV-2 Vaccination 04/03/2019, 04/26/2019   PNEUMOCOCCAL CONJUGATE-20 10/05/2021   PPD Test 07/04/2014, 07/13/2014   Pneumococcal Conjugate-13 05/03/2015   Pneumococcal Polysaccharide-23 07/20/2010   Td 06/03/2006    Screening Tests Health Maintenance  Topic Date Due   Zoster Vaccines- Shingrix (1 of 2) Never done    DTaP/Tdap/Td (2 - Tdap) 06/02/2016   Influenza Vaccine  09/26/2023   COVID-19 Vaccine (4 - 2025-26 season) 10/27/2023   Medicare Annual Wellness (AWV)  12/14/2024   Pneumococcal Vaccine: 50+ Years  Completed   DEXA SCAN  Completed   Meningococcal B Vaccine  Aged Out   Lung Cancer Screening  Discontinued   Hepatitis C Screening  Discontinued    Health Maintenance Items Addressed: Vaccines Due: Dtap, Flu, Covid and Shingrix.  Additional Screening:  Vision Screening: Recommended annual ophthalmology exams for early detection of glaucoma and other disorders of the eye. Is the patient up to date with their annual eye exam?  Yes  Who is the provider or what is the name of the office in which the patient attends annual eye exams? Reyes Bracket, MD.  Dental Screening: Recommended annual dental exams for proper oral hygiene  Community Resource Referral / Chronic Care Management: CRR required this visit?  No   CCM required this visit?  No   Plan:    I have personally reviewed and noted the following in the patient's chart:   Medical and social history  Use of alcohol, tobacco or illicit drugs  Current medications and supplements including opioid prescriptions. Patient is not currently taking opioid prescriptions. Functional ability and status Nutritional status Physical activity Advanced directives List of other physicians Hospitalizations, surgeries, and ER visits in previous 12 months Vitals Screenings to include cognitive, depression, and falls Referrals and appointments  In addition, I have reviewed and discussed with patient certain preventive protocols, quality metrics, and best practice recommendations. A written personalized care plan for preventive services as well as general preventive health recommendations were provided to patient.   Roz LOISE Fuller, LPN   [id number]/[id number]   After Visit Summary: (MyChart) Due to this being a telephonic visit, the after visit summary  with patients personalized plan was offered to patient via MyChart   Notes: Please refer to Routing Comments.

## 2023-12-17 NOTE — Progress Notes (Signed)
 Pt called for pre procedure instructions. Arrival time 0730 NPO after midnight explained Instructed to take am meds with sip of water.   Instructed pt need for ride home tomorrow and have responsible adult with them for 24 hrs post procedure.

## 2023-12-18 ENCOUNTER — Other Ambulatory Visit: Payer: Self-pay

## 2023-12-18 ENCOUNTER — Ambulatory Visit (HOSPITAL_COMMUNITY)
Admission: RE | Admit: 2023-12-18 | Discharge: 2023-12-18 | Disposition: A | Discharge status: Home / Self Care | Attending: Cardiovascular Disease | Admitting: Cardiovascular Disease

## 2023-12-18 ENCOUNTER — Ambulatory Visit (HOSPITAL_COMMUNITY): Admitting: Anesthesiology

## 2023-12-18 ENCOUNTER — Encounter (HOSPITAL_COMMUNITY)
Admission: RE | Disposition: A | Discharge status: Home / Self Care | Payer: Self-pay | Source: Home / Self Care | Attending: Cardiovascular Disease

## 2023-12-18 ENCOUNTER — Encounter (HOSPITAL_COMMUNITY): Payer: Self-pay | Admitting: Cardiovascular Disease

## 2023-12-18 ENCOUNTER — Ambulatory Visit (HOSPITAL_BASED_OUTPATIENT_CLINIC_OR_DEPARTMENT_OTHER)

## 2023-12-18 DIAGNOSIS — I340 Nonrheumatic mitral (valve) insufficiency: Secondary | ICD-10-CM | POA: Diagnosis not present

## 2023-12-18 DIAGNOSIS — Z7901 Long term (current) use of anticoagulants: Secondary | ICD-10-CM | POA: Diagnosis not present

## 2023-12-18 DIAGNOSIS — I081 Rheumatic disorders of both mitral and tricuspid valves: Secondary | ICD-10-CM | POA: Insufficient documentation

## 2023-12-18 DIAGNOSIS — I10 Essential (primary) hypertension: Secondary | ICD-10-CM | POA: Insufficient documentation

## 2023-12-18 DIAGNOSIS — Z87891 Personal history of nicotine dependence: Secondary | ICD-10-CM

## 2023-12-18 DIAGNOSIS — I4891 Unspecified atrial fibrillation: Secondary | ICD-10-CM | POA: Insufficient documentation

## 2023-12-18 DIAGNOSIS — I129 Hypertensive chronic kidney disease with stage 1 through stage 4 chronic kidney disease, or unspecified chronic kidney disease: Secondary | ICD-10-CM | POA: Diagnosis not present

## 2023-12-18 DIAGNOSIS — N1831 Chronic kidney disease, stage 3a: Secondary | ICD-10-CM

## 2023-12-18 DIAGNOSIS — Z79899 Other long term (current) drug therapy: Secondary | ICD-10-CM | POA: Insufficient documentation

## 2023-12-18 DIAGNOSIS — J449 Chronic obstructive pulmonary disease, unspecified: Secondary | ICD-10-CM | POA: Diagnosis not present

## 2023-12-18 HISTORY — PX: TRANSESOPHAGEAL ECHOCARDIOGRAM (CATH LAB): EP1270

## 2023-12-18 LAB — ECHO TEE
Calc EF: 67 %
MV M vel: 4.66 m/s
MV Peak grad: 86.9 mmHg
MV VTI: 2.96 cm2
Radius: 0.8 cm
S' Lateral: 3.32 cm
Single Plane A2C EF: 63.5 %
Single Plane A4C EF: 70.4 %

## 2023-12-18 SURGERY — TRANSESOPHAGEAL ECHOCARDIOGRAM (TEE) (CATHLAB)
Anesthesia: Monitor Anesthesia Care

## 2023-12-18 MED ORDER — PROPOFOL 500 MG/50ML IV EMUL
INTRAVENOUS | Status: DC | PRN
  Administered 2023-12-18: 30 ug/kg/min via INTRAVENOUS

## 2023-12-18 MED ORDER — PROPOFOL 10 MG/ML IV BOLUS
INTRAVENOUS | Status: DC | PRN
  Administered 2023-12-18: 50 mg via INTRAVENOUS
  Administered 2023-12-18: 30 mg via INTRAVENOUS

## 2023-12-18 MED ORDER — SODIUM CHLORIDE 0.9 % IV SOLN: INTRAVENOUS | Status: DC

## 2023-12-18 MED ORDER — LIDOCAINE 2% (20 MG/ML) 5 ML SYRINGE
INTRAMUSCULAR | Status: DC | PRN
  Administered 2023-12-18: 100 mg via INTRAVENOUS

## 2023-12-18 NOTE — Discharge Instructions (Signed)

## 2023-12-18 NOTE — Interval H&P Note (Signed)
 History and Physical Interval Note:  12/18/2023 7:52 AM  [name] [name]  has presented today for surgery, with the diagnosis of MITRAL REGURGITATION.  The various methods of treatment have been discussed with the patient and family. After consideration of risks, benefits and other options for treatment, the patient has consented to  Procedure(s): TRANSESOPHAGEAL ECHOCARDIOGRAM (N/A) as a surgical intervention.  The patient's history has been reviewed, patient examined, no change in status, stable for surgery.  I have reviewed the patient's chart and labs.  Questions were answered to the patient's satisfaction.     Truett Mcfarlan

## 2023-12-18 NOTE — Transfer of Care (Signed)
 Immediate Anesthesia Transfer of Care Note  Patient: [name] [name]  Procedure(s) Performed: TRANSESOPHAGEAL ECHOCARDIOGRAM ECHO TEE  Patient Location: PACU and Cath Lab  Anesthesia Type:MAC  Level of Consciousness: awake  Airway & Oxygen Therapy: Patient Spontanous Breathing and Patient connected to face mask oxygen  Post-op Assessment: Report given to RN and Post -op Vital signs reviewed and stable  Post vital signs: Reviewed and stable  Last Vitals:  Vitals Value Taken Time  BP 115/56 12/18/23 09:07  Temp 36.3 C 12/18/23 09:07  Pulse 76 12/18/23 09:09  Resp 29 12/18/23 09:09  SpO2 100 % 12/18/23 09:09  Vitals shown include unfiled device data.  Last Pain:  Vitals:   12/18/23 0907  TempSrc: Tympanic  PainSc: Asleep         Complications: No notable events documented.

## 2023-12-18 NOTE — Progress Notes (Signed)
  Echocardiogram Echocardiogram Transesophageal has been performed.  LAMON MAXWELL 12/18/2023, 9:35 AM

## 2023-12-18 NOTE — Anesthesia Preprocedure Evaluation (Signed)
 Anesthesia Evaluation  Patient identified by MRN, date of birth, ID band Patient awake    Reviewed: Allergy & Precautions, NPO status , Patient's Chart, lab work & pertinent test results  History of Anesthesia Complications Negative for: history of anesthetic complications  Airway Mallampati: II  TM Distance: >3 FB     Dental  (+) Edentulous Upper, Edentulous Lower, Dental Advisory Given   Pulmonary neg shortness of breath, neg sleep apnea, COPD, neg recent URI, former smoker   breath sounds clear to auscultation       Cardiovascular hypertension, Pt. on medications (-) angina (-) Past MI and (-) CHF + Valvular Problems/Murmurs MR  Rhythm:Regular   1. Left ventricular ejection fraction, by estimation, is 60 to 65%. The  left ventricle has normal function. The left ventricle has no regional  wall motion abnormalities. Left ventricular diastolic parameters were  normal.   2. Right ventricular systolic function is normal. The right ventricular  size is normal. There is moderately elevated pulmonary artery systolic  pressure. The estimated right ventricular systolic pressure is 45.5 mmHg.   3. Left atrial size was mildly dilated.   4. The aortic valve was not well visualized. Aortic valve regurgitation  is not visualized. No aortic stenosis is present.   5. The inferior vena cava is normal in size with greater than 50%  respiratory variability, suggesting right atrial pressure of 3 mmHg.   6. The mitral valve is abnormal. Severe mitral valve regurgitation.  Prolapse of the posterior leaflet, possibly flail, causing eccentric  anterior directed MR jet. MR appears severe. Recommend TEE for further  evaluation     Neuro/Psych negative neurological ROS  negative psych ROS   GI/Hepatic   Endo/Other    Renal/GU CRFRenal diseaseLab Results      Component                Value               Date                      NA                        141                 12/10/2023                K                        5.0                 12/10/2023                CO2                      25                  12/10/2023                GLUCOSE                  71                  12/10/2023                BUN  27                  12/10/2023                CREATININE               1.34 (H)            12/10/2023                CALCIUM                   9.0                 12/10/2023                EGFR                     40 (L)              12/10/2023                GFRNONAA                 49 (L)              11/18/2019                Musculoskeletal  (+) Arthritis ,    Abdominal   Peds  Hematology  (+) Blood dyscrasia, anemia Lab Results      Component                Value               Date                      WBC                      13.8 (H)            12/10/2023                HGB                      8.2 (L)             12/10/2023                HCT                      27.2 (L)            12/10/2023                MCV                      81                  12/10/2023                PLT                      312                 12/10/2023              Anesthesia Other Findings   Reproductive/Obstetrics  Anesthesia Physical Anesthesia Plan  ASA: 3  Anesthesia Plan: MAC   Post-op Pain Management: Minimal or no pain anticipated   Induction: Intravenous  PONV Risk Score and Plan: 2 and Propofol  infusion and Treatment may vary due to age or medical condition  Airway Management Planned: Nasal Cannula, Natural Airway and Simple Face Mask  Additional Equipment: None  Intra-op Plan:   Post-operative Plan:   Informed Consent: I have reviewed the patients History and Physical, chart, labs and discussed the procedure including the risks, benefits and alternatives for the proposed anesthesia with the patient or authorized representative  who has indicated his/her understanding and acceptance.     Dental advisory given  Plan Discussed with: CRNA  Anesthesia Plan Comments:          Anesthesia Quick Evaluation

## 2023-12-18 NOTE — Op Note (Signed)
 INDICATIONS: Mitral insufficiency  PROCEDURE:   Informed consent was obtained prior to the procedure. The risks, benefits and alternatives for the procedure were discussed and the patient comprehended these risks.  Risks include, but are not limited to, cough, sore throat, vomiting, nausea, somnolence, esophageal and stomach trauma or perforation, bleeding, low blood pressure, aspiration, pneumonia, infection, trauma to the teeth and death.    After a procedural time-out, the oropharynx was anesthetized with 20% benzocaine spray.   During this procedure the patient was administered IV propofol  by Anesthesiology, Dr. Leopoldo.  The transesophageal probe was inserted in the esophagus and stomach without difficulty and multiple views were obtained.  The patient was kept under observation until the patient left the procedure room.  The patient left the procedure room in stable condition.   Agitated microbubble saline contrast was administered.  COMPLICATIONS:    There were no immediate complications.  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 Larger flail of the entire P3 (medial) scallop is associated with severe MR, eccentric jet directed anteriorly. Smaller flail segment of the P1 (lateral) scallop. Both have commisural location. Normal left ventricular function, LVEF 60%.  RECOMMENDATIONS:     Not a good candidate for TEER (mitraClip) due to the location of the flail segments. Consider surgical repair if warranted by symptoms. The left ventricle is not excessively dilated and has normal systolic function.  Time Spent Directly with the Patient:  45 minutes   [name] [name] 12/18/2023, 9:11 AM

## 2023-12-22 NOTE — Anesthesia Postprocedure Evaluation (Signed)
 Anesthesia Post Note  Patient: [name] [name]  Procedure(s) Performed: TRANSESOPHAGEAL ECHOCARDIOGRAM ECHO TEE     Patient location during evaluation: Cath Lab Anesthesia Type: MAC Level of consciousness: awake and patient cooperative Pain management: pain level controlled Vital Signs Assessment: post-procedure vital signs reviewed and stable Respiratory status: spontaneous breathing, nonlabored ventilation and respiratory function stable Cardiovascular status: stable and blood pressure returned to baseline Postop Assessment: no apparent nausea or vomiting Anesthetic complications: no   No notable events documented.                Blasa Raisch

## 2023-12-31 ENCOUNTER — Encounter: Payer: Self-pay | Admitting: Family Medicine

## 2023-12-31 ENCOUNTER — Ambulatory Visit (INDEPENDENT_AMBULATORY_CARE_PROVIDER_SITE_OTHER): Admitting: Family Medicine

## 2023-12-31 VITALS — BP 124/64 | HR 65 | Wt 195.8 lb

## 2023-12-31 DIAGNOSIS — Z23 Encounter for immunization: Secondary | ICD-10-CM | POA: Diagnosis not present

## 2023-12-31 DIAGNOSIS — I480 Paroxysmal atrial fibrillation: Secondary | ICD-10-CM

## 2023-12-31 DIAGNOSIS — R7303 Prediabetes: Secondary | ICD-10-CM | POA: Diagnosis not present

## 2023-12-31 DIAGNOSIS — J449 Chronic obstructive pulmonary disease, unspecified: Secondary | ICD-10-CM | POA: Diagnosis not present

## 2023-12-31 DIAGNOSIS — Z13820 Encounter for screening for osteoporosis: Secondary | ICD-10-CM | POA: Diagnosis not present

## 2023-12-31 DIAGNOSIS — E785 Hyperlipidemia, unspecified: Secondary | ICD-10-CM

## 2023-12-31 LAB — POCT GLYCOSYLATED HEMOGLOBIN (HGB A1C): HbA1c, POC (prediabetic range): 5.7 % (ref 5.7–6.4)

## 2023-12-31 MED ORDER — SPACER/AERO-HOLDING CHAMBERS DEVI: 0 refills | Status: AC

## 2023-12-31 NOTE — Assessment & Plan Note (Signed)
 Per pt's cardiologist, Dr Lavona, pt does NOT need to take diltiazem  as long as her BP and HR are normal. Will contact patient with this recommendation. Advised patient to continue taking Eliquis .

## 2023-12-31 NOTE — Patient Instructions (Addendum)
 Thank you for coming in today! Here is a summary of what we discussed:  -Please schedule an appointment with our pharmacist, Dr Koval to evaluate your lung function.   -Please start using your Trelegy inhaler and always use a spacer when you use inhalers. Remember, this medicine is a daily maintenance one. The albuterol  inhaler is just as needed if your breathing is getting worse.  -Please ask about the RSV vaccine at your pharmacy  -I ordered the bone scan for you. You should be getting a call from the [facility] to schedule this. Please let us  know if you don't hear from them in the next few weeks.  We are checking some labs today. If they are abnormal, I will call you. If they are normal, I will send you a MyChart message (if it is active) or a letter in the mail. If you do not hear about your labs in the next 2 weeks, please call the office.   Please call the clinic at [phone] if your symptoms worsen or you have any concerns.  Best, Dr Adele  If you have not heard from the agency for your son's personal care services within 1 week, you can call me back and I can check the status for you. You can call the main line and either press the option for the nurse or ask to speak to [facility].

## 2023-12-31 NOTE — Progress Notes (Unsigned)
    SUBJECTIVE:   Chief compliant/HPI: annual examination  [name] [name] is a 83 y.o. who presents today for an annual exam.    History tabs reviewed and updated.   Review of systems form reviewed and notable for none  Diltiazem - has not been taking, unsure whether she needs to be taking this Has been taking Eliquis  Messaged cards  Has not been using Trelegy, was not aware that she was supposed be using it daily Has not had PFTs since 2023  OBJECTIVE:   BP 124/64   Pulse 65   Wt 195 lb 12.8 oz (88.8 kg)   SpO2 97%   BMI 34.68 kg/m   ***  ASSESSMENT/PLAN:   Assessment & Plan Moderate COPD (chronic obstructive pulmonary disease) (HCC)  Pre-diabetes  Hyperlipidemia, unspecified hyperlipidemia type  Osteoporosis screening  Encounter for immunization  Paroxysmal atrial fibrillation (HCC) Messaged patient's cardiologist about diltiazem .  Advised patient to continue taking Eliquis .   Annual Examination  See AVS for age appropriate recommendations  PHQ score ***, reviewed and discussed.  BP reviewed and at goal ***.  Asked about intimate partner violence and resources given as appropriate  Advance directives discussion ***  Considered the following items based upon USPSTF recommendations: Diabetes screening: ordered HIV testing:discussed and declined Hepatitis C: discussed and declined Hepatitis B:discussed and declined Syphilis if at high risk: discussed and declined GC/CT not at high risk and not ordered. Lipid panel (nonfasting or fasting) discussed based upon AHA recommendations and ordered.  Consider repeat every 4-6 years.  Reviewed risk factors for latent tuberculosis and not indicated Osteoporosis screening considered. DEXA ordered.   Cancer Screening Discussion  Breast cancer screening: recently completed and repeat not yet indicated Discussed family history, BRCA testing not indicated.  Vaccinations flu vaccine today.   Follow up in 1 year or  sooner if indicated.  MyChart Activation: Already signed up  Rea Raring, MD [facility]

## 2024-01-01 ENCOUNTER — Telehealth: Payer: Self-pay | Admitting: Cardiology

## 2024-01-01 LAB — LIPID PANEL
Chol/HDL Ratio: 3.4 ratio (ref 0.0–4.4)
Cholesterol, Total: 182 mg/dL (ref 100–199)
HDL: 53 mg/dL (ref >39)
LDL Chol Calc (NIH): 108 mg/dL — ABNORMAL HIGH (ref 0–99)
Triglycerides: 117 mg/dL (ref 0–149)
VLDL Cholesterol Cal: 21 mg/dL (ref 5–40)

## 2024-01-01 NOTE — Telephone Encounter (Signed)
 Patient called for lab results.  She stated she received a letter.

## 2024-01-01 NOTE — Telephone Encounter (Signed)
 Spoke with pt regarding results. Pt verbalized understanding. All questions if any were answered.

## 2024-01-01 NOTE — Assessment & Plan Note (Signed)
 Discussed need to use Trelegy daily. Provided spacer. Advised scheduling appt with [facility] pharmacist for PFTs/medication education.

## 2024-01-02 ENCOUNTER — Ambulatory Visit: Payer: Self-pay | Admitting: Family Medicine

## 2024-01-02 DIAGNOSIS — E785 Hyperlipidemia, unspecified: Secondary | ICD-10-CM

## 2024-01-02 MED ORDER — ROSUVASTATIN CALCIUM 20 MG PO TABS: 20.0000 mg | ORAL_TABLET | Freq: Every day | ORAL | 3 refills | Status: AC

## 2024-01-02 NOTE — Progress Notes (Signed)
 Called patient to discuss lab results. PREVENT score 32.5. Advised starting statin, ordered crestor 20mg .  Also discussed that she does not need to take diltiazem  as long as her BP and HR are stable, which they were at recent visit.   Pt asked about other labs- was called by cardiology RN yesterday and was told she was anemic. Pt unclear of plan for this. Will message cardiology provider.

## 2024-01-03 ENCOUNTER — Other Ambulatory Visit: Payer: Self-pay | Admitting: Family Medicine

## 2024-01-03 DIAGNOSIS — I10 Essential (primary) hypertension: Secondary | ICD-10-CM

## 2024-01-07 ENCOUNTER — Ambulatory Visit

## 2024-01-08 ENCOUNTER — Ambulatory Visit: Admitting: Pharmacist

## 2024-01-15 ENCOUNTER — Ambulatory Visit (INDEPENDENT_AMBULATORY_CARE_PROVIDER_SITE_OTHER): Admitting: Pharmacist

## 2024-01-15 ENCOUNTER — Encounter: Payer: Self-pay | Admitting: Pharmacist

## 2024-01-15 VITALS — BP 108/61 | HR 59 | Ht 63.0 in | Wt 194.6 lb

## 2024-01-15 DIAGNOSIS — J449 Chronic obstructive pulmonary disease, unspecified: Secondary | ICD-10-CM

## 2024-01-15 DIAGNOSIS — J441 Chronic obstructive pulmonary disease with (acute) exacerbation: Secondary | ICD-10-CM

## 2024-01-15 MED ORDER — TRELEGY ELLIPTA 100-62.5-25 MCG/ACT IN AEPB
1.0000 | INHALATION_SPRAY | Freq: Every day | RESPIRATORY_TRACT | 3 refills | Status: AC
Start: 2024-01-15 — End: ?

## 2024-01-15 NOTE — Assessment & Plan Note (Addendum)
 Patient has been experiencing SOB for a long time and she does not recall when it started. Spirometry evaluation reveals Severe/Moderate obstructive lung disease. Pre-nebulizer: FEV1 49.6%, FEV1/FVC 62.9%. Post nebulized albuterol  tx revealed reversibility . Spirometry GOLD Treatment Group E based on CAT score and exacerbations in the last year.   Patient with fair inhaler technique. Patient medication adherence poor. Reported she has not been taking Trelegy (umeclidinium / vilanterol / fluticasone ) since she thinks her breathing is not bad but she has been using albuterol  daily.  -Restart Trelegy (umeclidinium / vilanterol / fluticasone ) inhaler 1 puff per day -Continue albuterol  inhaler as needed -Educated patient on purpose, proper use, potential adverse effects of Trelegy (umeclidinium / vilanterol / fluticasone ) inhaler -Reviewed results of pulmonary function tests.  Pt verbalized understanding of results and education.

## 2024-01-15 NOTE — Patient Instructions (Addendum)
 It was nice to see you today!  Medication Changes: RESTART Trelegy (umeclidinium / vilanterol / fluticasone ) inhaler 1 puff per day  Continue albuterol  inhaler as needed  Continue all other medication the same.

## 2024-01-15 NOTE — Progress Notes (Signed)
   S:    Chief Complaint  Patient presents with   Medication Management    PFT Lung function tests   83 y.o. female who presents for diabetes evaluation, education, and management. Patient arrives in good spirits and presents without any assistance.   Patient was referred and last seen by Primary Care Provider, Dr. Adele, on 12/31/2023.  At last visit, patient was referred for lung function testing.   PMH is significant for tobacco use, hypertension, COPD, Afib  Patient reports breathing has been short and experiences SOB frequently.   Medication adherence reported suboptimal Patient reports last dose of COPD medications was a while ago. Patient reports taking medications infrequently Current COPD medications: Trelegy (umeclidinium / vilanterol / fluticasone ) 100-62.5-25mg  and Albuterol  108 mcg 2 puffs q6h Rescue inhaler use frequency: daily Patient exacerbation hx: Acute exacerbation in 2024 Reported she has not been taking diltiazem  per her preference.   O: Review of Systems  All other systems reviewed and are negative.  Physical Exam Neurological:     Mental Status: She is alert.  Psychiatric:        Mood and Affect: Mood normal.        Behavior: Behavior normal.        Thought Content: Thought content normal.        Judgment: Judgment normal.    Vitals:   01/15/24 1135  BP: 108/61  Pulse: (!) 59  SpO2: 100%   mMRC score= < 2 CAT score= 15 See Documentation Flowsheet - CAT/COPD for complete symptom scoring.  See scanned report or Documentation Flowsheet (discrete results - PFTs) for Spirometry results. Patient provided good effort while attempting spirometry.   Lung Age = 89 Albuterol  Neb  Lot# [id number]     Exp. 06/2024  A/P: Patient has been experiencing SOB for a long time and she does not recall when it started. Spirometry evaluation reveals Severe/Moderate obstructive lung disease. Pre-nebulizer: FEV1 49.6%, FEV1/FVC 62.9%. Post nebulized albuterol  tx  revealed reversibility . Spirometry GOLD Treatment Group E based on CAT score and exacerbations in the last year.   Patient with fair inhaler technique. Patient medication adherence poor. Reported she has not been taking Trelegy (umeclidinium / vilanterol / fluticasone ) since she thinks her breathing is not bad but she has been using albuterol  daily.  -Restart Trelegy (umeclidinium / vilanterol / fluticasone ) inhaler 1 puff per day -Continue albuterol  inhaler as needed -Educated patient on purpose, proper use, potential adverse effects of Trelegy (umeclidinium / vilanterol / fluticasone ) inhaler -Reviewed results of pulmonary function tests.  Pt verbalized understanding of results and education.    Written patient instructions provided.   Total time in face to face counseling 45 minutes.    Follow-up:  PCP clinic visit 03/08/2024 Patient seen with Lawson Mao, PharmD Candidate - PY3 student and Recardo [employer] PharmD - PY4 Candidate.

## 2024-01-16 NOTE — Progress Notes (Signed)
 Reviewed and agree with Dr Rennis plan.

## 2024-02-28 NOTE — Progress Notes (Unsigned)
 " Cardiology Office Note:   Date:  03/01/2024  ID:  [name], [name] [id number], MRN [id number] PCP: Adele Song, MD  [facility] Providers Cardiologist:  None {  History of Present Illness:   [name] [name] is a 84 y.o. female who presents for evaluation of atrial fib.  This was diagnosed in March.  I reviewed some records from her primary provider.  She was started on Eliquis  2-1/2 twice a day but she does not recall ever getting that or starting it.  She does not feel palpitations.  She does not feel her heart racing or skipping.  She does not have any presyncope or syncope.  She lives with her husband.  She does not do a ton around the house but she does some light housekeeping.  With this she denies any cardiovascular symptoms such as shortness of breath, PND or orthopnea.  She does not have any chest pressure, neck or arm discomfort.  She has a bit of a cough with cold-like symptoms recently.      She saw Dr. Alveta in 2017 for evaluation of a murmur.  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  I do not see that she has had follow-up with this.I sent her for an echocardiogram and she has severe mitral regurgitation.  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  After the last visit I sent her for a TEE.  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    She presents for follow up.  She has no new complaints.  She has some chronic cough somewhat nonproductive.  She is not having any chest pressure, neck or arm discomfort.  She has had no new palpitations, presyncope or syncope.  She has had no new PND or orthopnea.  She has had no weight gain or edema.  She occasionally feels some skipping beats but this has been a chronic issue.   ROS: As stated in the HPI and negative for all other systems.  Studies Reviewed:    EKG:     NA  Risk Assessment/Calculations:     CHA2DS2-VASc Score = 3   This indicates a 3.2% annual risk of stroke. The patient's score is based upon: CHF History: 0 HTN History: 0 Diabetes History: 0 Stroke History: 0 Vascular Disease History: 0 Age Score: 2 Gender Score: 1   Physical Exam:   VS:  BP 120/60   Pulse 82   Ht 5' 3 (1.6 m)   Wt 200 lb (90.7 kg)   SpO2 92%   BMI 35.43 kg/m    Wt Readings from Last 3 Encounters:  03/01/24 200 lb (90.7 kg)  01/15/24 194 lb 9.6 oz (88.3 kg)  12/31/23 195 lb 12.8 oz (88.8 kg)     GEN: Well nourished, well developed in no acute distress NECK: No JVD; No carotid bruits CARDIAC: RRR, 3/6 holosystolic murmur heard at the apex and radiating slightly to the axilla, no diastolic murmurs, rubs, gallops RESPIRATORY:  Clear to auscultation without rales, wheezing or rhonchi  ABDOMEN: Soft, non-tender, non-distended EXTREMITIES:  No edema; No deformity   ASSESSMENT AND PLAN:   Mitral regurgitation:   She has severe MR and will need repair.  I will plan a left heart cath and then referral to [facility] for surgical consultation.  We did discuss the catheterization.   The patient understands that risks included but  are not limited to stroke (1 in 1000), death (1 in 1000), kidney failure [usually temporary] (1 in 500), bleeding (1 in 200), allergic reaction [possibly serious] (1 in 200).  The patient understands and agrees to proceed.   Atrial fib:     She is not aware of this rhythm.  A monitor demonstrated PAF.  She will hold her Eliquis  for couple of days prior to the cath and was given that instruction.  She had a total of 59 minutes of fibrillation and no change in therapy is necessary.  Cough: This might be related to some sinus congestion but I like to check a PA and lateral chest x-ray.     Follow up with me in about 3 months.   Signed, Lynwood Schilling, MD   "

## 2024-03-01 ENCOUNTER — Encounter: Payer: Self-pay | Admitting: Cardiology

## 2024-03-01 ENCOUNTER — Ambulatory Visit: Attending: Cardiology | Admitting: Cardiology

## 2024-03-01 ENCOUNTER — Ambulatory Visit (HOSPITAL_COMMUNITY)
Admission: RE | Admit: 2024-03-01 | Discharge: 2024-03-01 | Disposition: A | Discharge status: Home / Self Care | Source: Ambulatory Visit | Attending: Cardiology | Admitting: Cardiology

## 2024-03-01 VITALS — BP 120/60 | HR 82 | Ht 63.0 in | Wt 200.0 lb

## 2024-03-01 DIAGNOSIS — I340 Nonrheumatic mitral (valve) insufficiency: Secondary | ICD-10-CM | POA: Diagnosis not present

## 2024-03-01 DIAGNOSIS — Z01812 Encounter for preprocedural laboratory examination: Secondary | ICD-10-CM

## 2024-03-01 DIAGNOSIS — R059 Cough, unspecified: Secondary | ICD-10-CM | POA: Diagnosis not present

## 2024-03-01 DIAGNOSIS — I480 Paroxysmal atrial fibrillation: Secondary | ICD-10-CM

## 2024-03-01 NOTE — Patient Instructions (Signed)
 Medication Instructions:  Your physician recommends that you continue on your current medications as directed. Please refer to the Current Medication list given to you today.  *If you need a refill on your cardiac medications before your next appointment, please call your pharmacy*  Lab Work: CBC, BMET today at [facility] If you have labs (blood work) drawn today and your tests are completely normal, you will receive your results only by: MyChart Message (if you have MyChart) OR A paper copy in the mail If you have any lab test that is abnormal or we need to change your treatment, we will call you to review the results.  Testing/Procedures: Chest xray  Follow-Up: At [facility], you and your health needs are our priority.  As part of our continuing mission to provide you with exceptional heart care, our providers are all part of one team.  This team includes your primary Cardiologist (physician) and Advanced Practice Providers or APPs (Physician Assistants and Nurse Practitioners) who all work together to provide you with the care you need, when you need it.  Your next appointment:   3 month(s)  Provider:   Lavona, MD  We recommend signing up for the patient portal called MyChart.  Sign up information is provided on this After Visit Summary.  MyChart is used to connect with patients for Virtual Visits (Telemedicine).  Patients are able to view lab/test results, encounter notes, upcoming appointments, etc.  Non-urgent messages can be sent to your provider as well.   To learn more about what you can do with MyChart, go to [email or web address].   Other Instructions  [facility] A DEPT OF [place] AT [address] A DEPT OF THE [place] [address] [place] [address] Dept: [phone] Loc: [phone]  [name] [name]  03/01/2024  You are scheduled for a Cardiac Catheterization on Friday, January 16 with Dr.  Ozell Fell.  1. Please arrive at the [facility] (Main Entrance A) at [facility]: [address] [place], [place] [address] at 9:00 AM (This time is 2 hour(s) before your procedure to ensure your preparation).   Free valet parking service is available. You will check in at ADMITTING. The support person will be asked to wait in the waiting room.  It is OK to have someone drop you off and come back when you are ready to be discharged.    Special note: Every effort is made to have your procedure done on time. Please understand that emergencies sometimes delay scheduled procedures.  2. Diet: Nothing to eat after midnight.   3. Hydration: You need to be well hydrated before your procedure. On January 16, you may drink approved liquids (see below) until 2 hours before the procedure, with 16 oz of water  as your last intake.   List of approved liquids water , clear juice, clear tea, black coffee, fruit juices, non-citric and without pulp, carbonated beverages, Gatorade, Kool -Aid, plain Jello-O and plain ice popsicles.  4. Labs: CBC, BMET today at [facility]  5. Medication instructions in preparation for your procedure:   Contrast Allergy: No  *For reference purposes while preparing patient instructions.   Delete this med list prior to printing instructions for patient.*  Stop taking Eliquis  (Apixiban) on Wednesday, January 14.  On the morning of your procedure, take your Aspirin 81 mg and any morning medicines NOT listed above.  You may use sips of water .  6. Plan to go  home the same day, you will only stay overnight if medically necessary. 7. Bring a current list of your medications and current insurance cards. 8. You MUST have a responsible person to drive you home. 9. Someone MUST be with you the first 24 hours after you arrive home or your discharge will be delayed. 10. Please wear clothes that are easy to get on and off and wear slip-on shoes.  Thank you for allowing us  to  care for you!   -- [facility]

## 2024-03-02 ENCOUNTER — Telehealth: Payer: Self-pay | Admitting: Cardiology

## 2024-03-02 NOTE — Telephone Encounter (Signed)
 Husband is requesting a call back to discuss 1/05 office visit.

## 2024-03-02 NOTE — Telephone Encounter (Signed)
 Returned husbands call regarding upcoming office visit. Reviewed heart catheterization procedure and pre-procedure instructions. Instructed to stop taking Eliquis  on the 14th and to arrive at 9:00 AM on the day of the procedure. Patient verbalized understanding. Advised to contact the office with any questions or concerns.

## 2024-03-08 ENCOUNTER — Ambulatory Visit: Admitting: Family Medicine

## 2024-03-08 ENCOUNTER — Other Ambulatory Visit (HOSPITAL_COMMUNITY): Payer: Self-pay

## 2024-03-10 ENCOUNTER — Ambulatory Visit: Payer: Self-pay | Admitting: Cardiology

## 2024-03-10 ENCOUNTER — Telehealth: Payer: Self-pay | Admitting: *Deleted

## 2024-03-10 NOTE — Telephone Encounter (Signed)
 I spoke with patient and she requests to cancel Cardiac Cath 03/12/24.  Pt tells me she has a cold and cough and just doesn't feel good. Pt is aware of 03/01/24 CXR results per Dr Lavona: There were no acute findings on the CXR.  Cath is up coming.  Call Ms. Marylen with the results and send results to Adele Song, MD -I have sent results of CXR to Dr Adele.  Patient wants to wait until she is feeling better before she reschedules the cardiac cath and did not want to schedule any follow up in our office at this time. At patient's request I have cancelled cardiac cath 03/12/24.

## 2024-03-10 NOTE — Telephone Encounter (Signed)
 Patient needs to get pre-procedure BMP/CBC today (Wednesday 1/14) for Cardiac Cath 03/12/24. Patient needs to hold Eliquis  starting today 1/14. I left a message for patient to call back to discuss and review procedure instructions.  Cardiac Catheterization scheduled at [facility] for: Wednesday March 12, 2024 11 AM Arrival time [facility] Main Entrance A at: 9 AM  Diet: -Nothing to eat after midnight.  Hydration: -May drink clear liquids until 2 hours before the procedure.  Approved liquids: Water , clear tea, black coffee, fruit juices-non-citric and without pulp,Gatorade, plain Jello/popsicles.   -Please drink 16 oz of water  2 hours before procedure.  Medication instructions: -Hold:  Eliquis -none 03/10/24 until post procedure  Losartan -HCT-AM of procedure -Other usual morning medications can be taken including aspirin 81 mg.  Plan to go home the same day, you will only stay overnight if medically necessary.  You must have responsible adult to drive you home.  Someone must be with you the first 24 hours after you arrive home.

## 2024-03-12 ENCOUNTER — Encounter (HOSPITAL_COMMUNITY): Payer: Self-pay

## 2024-03-12 ENCOUNTER — Ambulatory Visit (HOSPITAL_COMMUNITY): Admit: 2024-03-12 | Admitting: Cardiovascular Disease

## 2024-03-12 SURGERY — LEFT HEART CATH AND CORONARY ANGIOGRAPHY
Anesthesia: LOCAL

## 2024-03-17 ENCOUNTER — Ambulatory Visit: Payer: Self-pay | Admitting: Family Medicine

## 2024-12-16 ENCOUNTER — Encounter
